# Patient Record
Sex: Female | Born: 1940
Health system: Southern US, Community
[De-identification: ages and names within clinical notes are randomized; demographics above are authoritative.]

## PROBLEM LIST (undated history)

## (undated) DIAGNOSIS — I10 Essential (primary) hypertension: Secondary | ICD-10-CM

## (undated) DIAGNOSIS — Z9889 Other specified postprocedural states: Secondary | ICD-10-CM

## (undated) HISTORY — DX: Other specified postprocedural states: Z98.890

## (undated) HISTORY — DX: Essential (primary) hypertension: I10

## (undated) HISTORY — PX: TONSILLECTOMY: SUR1361

---

## 2014-05-21 DIAGNOSIS — E559 Vitamin D deficiency, unspecified: Secondary | ICD-10-CM | POA: Diagnosis not present

## 2014-05-21 DIAGNOSIS — I10 Essential (primary) hypertension: Secondary | ICD-10-CM | POA: Diagnosis not present

## 2014-05-21 DIAGNOSIS — M8580 Other specified disorders of bone density and structure, unspecified site: Secondary | ICD-10-CM | POA: Diagnosis not present

## 2014-05-21 DIAGNOSIS — E8881 Metabolic syndrome: Secondary | ICD-10-CM | POA: Diagnosis not present

## 2014-05-21 DIAGNOSIS — Z0000 Encounter for general adult medical examination without abnormal findings: Secondary | ICD-10-CM | POA: Diagnosis not present

## 2014-09-02 DIAGNOSIS — H169 Unspecified keratitis: Secondary | ICD-10-CM | POA: Diagnosis not present

## 2014-10-01 DIAGNOSIS — E8881 Metabolic syndrome: Secondary | ICD-10-CM | POA: Diagnosis not present

## 2014-10-01 DIAGNOSIS — M8580 Other specified disorders of bone density and structure, unspecified site: Secondary | ICD-10-CM | POA: Diagnosis not present

## 2014-10-01 DIAGNOSIS — I10 Essential (primary) hypertension: Secondary | ICD-10-CM | POA: Diagnosis not present

## 2014-10-01 DIAGNOSIS — J309 Allergic rhinitis, unspecified: Secondary | ICD-10-CM | POA: Diagnosis not present

## 2014-10-19 DIAGNOSIS — K5900 Constipation, unspecified: Secondary | ICD-10-CM | POA: Diagnosis not present

## 2014-10-19 DIAGNOSIS — Z1211 Encounter for screening for malignant neoplasm of colon: Secondary | ICD-10-CM | POA: Diagnosis not present

## 2014-11-13 DIAGNOSIS — Z1211 Encounter for screening for malignant neoplasm of colon: Secondary | ICD-10-CM | POA: Diagnosis not present

## 2014-11-13 DIAGNOSIS — I10 Essential (primary) hypertension: Secondary | ICD-10-CM | POA: Diagnosis not present

## 2014-12-21 DIAGNOSIS — Z1231 Encounter for screening mammogram for malignant neoplasm of breast: Secondary | ICD-10-CM | POA: Diagnosis not present

## 2015-01-05 DIAGNOSIS — N63 Unspecified lump in breast: Secondary | ICD-10-CM | POA: Diagnosis not present

## 2015-01-05 DIAGNOSIS — R928 Other abnormal and inconclusive findings on diagnostic imaging of breast: Secondary | ICD-10-CM | POA: Diagnosis not present

## 2015-01-06 DIAGNOSIS — J309 Allergic rhinitis, unspecified: Secondary | ICD-10-CM | POA: Diagnosis not present

## 2015-01-06 DIAGNOSIS — M8580 Other specified disorders of bone density and structure, unspecified site: Secondary | ICD-10-CM | POA: Diagnosis not present

## 2015-01-06 DIAGNOSIS — I10 Essential (primary) hypertension: Secondary | ICD-10-CM | POA: Diagnosis not present

## 2015-01-06 DIAGNOSIS — Z23 Encounter for immunization: Secondary | ICD-10-CM | POA: Diagnosis not present

## 2015-01-06 DIAGNOSIS — E8881 Metabolic syndrome: Secondary | ICD-10-CM | POA: Diagnosis not present

## 2015-04-13 DIAGNOSIS — E7800 Pure hypercholesterolemia, unspecified: Secondary | ICD-10-CM | POA: Diagnosis not present

## 2015-04-13 DIAGNOSIS — E8881 Metabolic syndrome: Secondary | ICD-10-CM | POA: Diagnosis not present

## 2015-04-21 DIAGNOSIS — M8580 Other specified disorders of bone density and structure, unspecified site: Secondary | ICD-10-CM | POA: Diagnosis not present

## 2015-04-21 DIAGNOSIS — E8881 Metabolic syndrome: Secondary | ICD-10-CM | POA: Diagnosis not present

## 2015-04-21 DIAGNOSIS — E559 Vitamin D deficiency, unspecified: Secondary | ICD-10-CM | POA: Diagnosis not present

## 2015-04-21 DIAGNOSIS — E7800 Pure hypercholesterolemia, unspecified: Secondary | ICD-10-CM | POA: Diagnosis not present

## 2015-06-17 DIAGNOSIS — H1033 Unspecified acute conjunctivitis, bilateral: Secondary | ICD-10-CM | POA: Diagnosis not present

## 2015-06-17 DIAGNOSIS — B9689 Other specified bacterial agents as the cause of diseases classified elsewhere: Secondary | ICD-10-CM | POA: Diagnosis not present

## 2015-06-17 DIAGNOSIS — J019 Acute sinusitis, unspecified: Secondary | ICD-10-CM | POA: Diagnosis not present

## 2015-06-17 DIAGNOSIS — Z0000 Encounter for general adult medical examination without abnormal findings: Secondary | ICD-10-CM | POA: Diagnosis not present

## 2015-09-02 DIAGNOSIS — H2513 Age-related nuclear cataract, bilateral: Secondary | ICD-10-CM | POA: Diagnosis not present

## 2015-10-20 DIAGNOSIS — Z79899 Other long term (current) drug therapy: Secondary | ICD-10-CM | POA: Diagnosis not present

## 2015-10-20 DIAGNOSIS — E8881 Metabolic syndrome: Secondary | ICD-10-CM | POA: Diagnosis not present

## 2015-10-20 DIAGNOSIS — E7800 Pure hypercholesterolemia, unspecified: Secondary | ICD-10-CM | POA: Diagnosis not present

## 2015-10-27 DIAGNOSIS — M898X9 Other specified disorders of bone, unspecified site: Secondary | ICD-10-CM | POA: Diagnosis not present

## 2015-10-27 DIAGNOSIS — Z9181 History of falling: Secondary | ICD-10-CM | POA: Diagnosis not present

## 2015-10-27 DIAGNOSIS — Z1389 Encounter for screening for other disorder: Secondary | ICD-10-CM | POA: Diagnosis not present

## 2015-10-27 DIAGNOSIS — Z0000 Encounter for general adult medical examination without abnormal findings: Secondary | ICD-10-CM | POA: Diagnosis not present

## 2015-12-27 DIAGNOSIS — Z1231 Encounter for screening mammogram for malignant neoplasm of breast: Secondary | ICD-10-CM | POA: Diagnosis not present

## 2016-04-17 DIAGNOSIS — M8589 Other specified disorders of bone density and structure, multiple sites: Secondary | ICD-10-CM | POA: Diagnosis not present

## 2016-04-17 DIAGNOSIS — Z79899 Other long term (current) drug therapy: Secondary | ICD-10-CM | POA: Diagnosis not present

## 2016-04-17 DIAGNOSIS — E785 Hyperlipidemia, unspecified: Secondary | ICD-10-CM | POA: Diagnosis not present

## 2016-04-17 DIAGNOSIS — J309 Allergic rhinitis, unspecified: Secondary | ICD-10-CM | POA: Diagnosis not present

## 2016-04-17 DIAGNOSIS — E8881 Metabolic syndrome: Secondary | ICD-10-CM | POA: Diagnosis not present

## 2016-04-17 DIAGNOSIS — E559 Vitamin D deficiency, unspecified: Secondary | ICD-10-CM | POA: Diagnosis not present

## 2016-04-17 DIAGNOSIS — I10 Essential (primary) hypertension: Secondary | ICD-10-CM | POA: Diagnosis not present

## 2016-08-30 DIAGNOSIS — H2513 Age-related nuclear cataract, bilateral: Secondary | ICD-10-CM | POA: Diagnosis not present

## 2016-08-30 DIAGNOSIS — H524 Presbyopia: Secondary | ICD-10-CM | POA: Diagnosis not present

## 2016-10-31 DIAGNOSIS — J309 Allergic rhinitis, unspecified: Secondary | ICD-10-CM | POA: Diagnosis not present

## 2016-10-31 DIAGNOSIS — Z0000 Encounter for general adult medical examination without abnormal findings: Secondary | ICD-10-CM | POA: Diagnosis not present

## 2016-10-31 DIAGNOSIS — M8589 Other specified disorders of bone density and structure, multiple sites: Secondary | ICD-10-CM | POA: Diagnosis not present

## 2016-10-31 DIAGNOSIS — E785 Hyperlipidemia, unspecified: Secondary | ICD-10-CM | POA: Diagnosis not present

## 2016-10-31 DIAGNOSIS — E559 Vitamin D deficiency, unspecified: Secondary | ICD-10-CM | POA: Diagnosis not present

## 2016-10-31 DIAGNOSIS — E8881 Metabolic syndrome: Secondary | ICD-10-CM | POA: Diagnosis not present

## 2016-10-31 DIAGNOSIS — Z79899 Other long term (current) drug therapy: Secondary | ICD-10-CM | POA: Diagnosis not present

## 2016-10-31 DIAGNOSIS — Z9181 History of falling: Secondary | ICD-10-CM | POA: Diagnosis not present

## 2016-10-31 DIAGNOSIS — M150 Primary generalized (osteo)arthritis: Secondary | ICD-10-CM | POA: Diagnosis not present

## 2016-12-13 DIAGNOSIS — Z23 Encounter for immunization: Secondary | ICD-10-CM | POA: Diagnosis not present

## 2016-12-28 DIAGNOSIS — Z1231 Encounter for screening mammogram for malignant neoplasm of breast: Secondary | ICD-10-CM | POA: Diagnosis not present

## 2017-03-07 DIAGNOSIS — H40003 Preglaucoma, unspecified, bilateral: Secondary | ICD-10-CM | POA: Diagnosis not present

## 2017-04-17 DIAGNOSIS — Z6822 Body mass index (BMI) 22.0-22.9, adult: Secondary | ICD-10-CM | POA: Diagnosis not present

## 2017-04-17 DIAGNOSIS — Z1331 Encounter for screening for depression: Secondary | ICD-10-CM | POA: Diagnosis not present

## 2017-04-17 DIAGNOSIS — R002 Palpitations: Secondary | ICD-10-CM | POA: Diagnosis not present

## 2017-04-17 DIAGNOSIS — E785 Hyperlipidemia, unspecified: Secondary | ICD-10-CM | POA: Diagnosis not present

## 2017-04-17 DIAGNOSIS — Z1339 Encounter for screening examination for other mental health and behavioral disorders: Secondary | ICD-10-CM | POA: Diagnosis not present

## 2017-04-17 DIAGNOSIS — Z79899 Other long term (current) drug therapy: Secondary | ICD-10-CM | POA: Diagnosis not present

## 2017-04-17 DIAGNOSIS — E559 Vitamin D deficiency, unspecified: Secondary | ICD-10-CM | POA: Diagnosis not present

## 2017-04-17 DIAGNOSIS — Z0000 Encounter for general adult medical examination without abnormal findings: Secondary | ICD-10-CM | POA: Diagnosis not present

## 2017-05-07 DIAGNOSIS — I10 Essential (primary) hypertension: Secondary | ICD-10-CM | POA: Diagnosis not present

## 2017-05-07 DIAGNOSIS — R002 Palpitations: Secondary | ICD-10-CM | POA: Diagnosis not present

## 2017-05-07 DIAGNOSIS — R0609 Other forms of dyspnea: Secondary | ICD-10-CM | POA: Diagnosis not present

## 2017-05-07 DIAGNOSIS — E785 Hyperlipidemia, unspecified: Secondary | ICD-10-CM | POA: Diagnosis not present

## 2017-05-07 DIAGNOSIS — R011 Cardiac murmur, unspecified: Secondary | ICD-10-CM | POA: Diagnosis not present

## 2017-05-17 DIAGNOSIS — R0602 Shortness of breath: Secondary | ICD-10-CM | POA: Diagnosis not present

## 2017-05-17 DIAGNOSIS — R011 Cardiac murmur, unspecified: Secondary | ICD-10-CM | POA: Diagnosis not present

## 2017-05-17 DIAGNOSIS — I2510 Atherosclerotic heart disease of native coronary artery without angina pectoris: Secondary | ICD-10-CM | POA: Diagnosis not present

## 2017-05-21 DIAGNOSIS — I340 Nonrheumatic mitral (valve) insufficiency: Secondary | ICD-10-CM | POA: Diagnosis not present

## 2017-05-21 DIAGNOSIS — R002 Palpitations: Secondary | ICD-10-CM | POA: Diagnosis not present

## 2017-05-21 DIAGNOSIS — I341 Nonrheumatic mitral (valve) prolapse: Secondary | ICD-10-CM | POA: Diagnosis not present

## 2017-05-21 DIAGNOSIS — R0609 Other forms of dyspnea: Secondary | ICD-10-CM | POA: Diagnosis not present

## 2017-06-12 DIAGNOSIS — I341 Nonrheumatic mitral (valve) prolapse: Secondary | ICD-10-CM | POA: Diagnosis not present

## 2017-06-20 DIAGNOSIS — I341 Nonrheumatic mitral (valve) prolapse: Secondary | ICD-10-CM | POA: Diagnosis not present

## 2017-06-20 DIAGNOSIS — I272 Pulmonary hypertension, unspecified: Secondary | ICD-10-CM | POA: Diagnosis not present

## 2017-06-20 DIAGNOSIS — R05 Cough: Secondary | ICD-10-CM | POA: Diagnosis not present

## 2017-06-20 DIAGNOSIS — I340 Nonrheumatic mitral (valve) insufficiency: Secondary | ICD-10-CM | POA: Diagnosis not present

## 2017-06-20 DIAGNOSIS — I517 Cardiomegaly: Secondary | ICD-10-CM | POA: Diagnosis not present

## 2017-06-20 DIAGNOSIS — I081 Rheumatic disorders of both mitral and tricuspid valves: Secondary | ICD-10-CM | POA: Diagnosis not present

## 2017-07-02 DIAGNOSIS — R0609 Other forms of dyspnea: Secondary | ICD-10-CM | POA: Diagnosis not present

## 2017-07-02 DIAGNOSIS — I340 Nonrheumatic mitral (valve) insufficiency: Secondary | ICD-10-CM | POA: Diagnosis not present

## 2017-07-02 DIAGNOSIS — E785 Hyperlipidemia, unspecified: Secondary | ICD-10-CM | POA: Diagnosis not present

## 2017-07-02 DIAGNOSIS — I341 Nonrheumatic mitral (valve) prolapse: Secondary | ICD-10-CM | POA: Diagnosis not present

## 2017-07-05 DIAGNOSIS — I509 Heart failure, unspecified: Secondary | ICD-10-CM | POA: Diagnosis not present

## 2017-07-05 DIAGNOSIS — I340 Nonrheumatic mitral (valve) insufficiency: Secondary | ICD-10-CM | POA: Diagnosis not present

## 2017-07-24 DIAGNOSIS — J90 Pleural effusion, not elsewhere classified: Secondary | ICD-10-CM | POA: Diagnosis not present

## 2017-07-24 DIAGNOSIS — R011 Cardiac murmur, unspecified: Secondary | ICD-10-CM | POA: Diagnosis not present

## 2017-07-24 DIAGNOSIS — J95811 Postprocedural pneumothorax: Secondary | ICD-10-CM | POA: Diagnosis not present

## 2017-07-24 DIAGNOSIS — I517 Cardiomegaly: Secondary | ICD-10-CM | POA: Diagnosis not present

## 2017-07-24 DIAGNOSIS — I340 Nonrheumatic mitral (valve) insufficiency: Secondary | ICD-10-CM | POA: Diagnosis not present

## 2017-07-24 DIAGNOSIS — I10 Essential (primary) hypertension: Secondary | ICD-10-CM | POA: Diagnosis not present

## 2017-07-24 DIAGNOSIS — E785 Hyperlipidemia, unspecified: Secondary | ICD-10-CM | POA: Diagnosis not present

## 2017-07-24 DIAGNOSIS — Z01810 Encounter for preprocedural cardiovascular examination: Secondary | ICD-10-CM | POA: Diagnosis not present

## 2017-07-24 DIAGNOSIS — R001 Bradycardia, unspecified: Secondary | ICD-10-CM | POA: Diagnosis not present

## 2017-07-24 DIAGNOSIS — I4891 Unspecified atrial fibrillation: Secondary | ICD-10-CM | POA: Diagnosis not present

## 2017-07-24 DIAGNOSIS — R918 Other nonspecific abnormal finding of lung field: Secondary | ICD-10-CM | POA: Diagnosis not present

## 2017-07-24 DIAGNOSIS — I959 Hypotension, unspecified: Secondary | ICD-10-CM | POA: Diagnosis not present

## 2017-07-31 DIAGNOSIS — Z950 Presence of cardiac pacemaker: Secondary | ICD-10-CM | POA: Diagnosis not present

## 2017-07-31 DIAGNOSIS — J95811 Postprocedural pneumothorax: Secondary | ICD-10-CM | POA: Diagnosis not present

## 2017-07-31 DIAGNOSIS — J982 Interstitial emphysema: Secondary | ICD-10-CM | POA: Diagnosis not present

## 2017-07-31 DIAGNOSIS — E785 Hyperlipidemia, unspecified: Secondary | ICD-10-CM | POA: Diagnosis not present

## 2017-07-31 DIAGNOSIS — Z9911 Dependence on respirator [ventilator] status: Secondary | ICD-10-CM | POA: Diagnosis not present

## 2017-07-31 DIAGNOSIS — I4891 Unspecified atrial fibrillation: Secondary | ICD-10-CM | POA: Diagnosis not present

## 2017-07-31 DIAGNOSIS — R918 Other nonspecific abnormal finding of lung field: Secondary | ICD-10-CM | POA: Diagnosis not present

## 2017-07-31 DIAGNOSIS — D62 Acute posthemorrhagic anemia: Secondary | ICD-10-CM | POA: Diagnosis not present

## 2017-07-31 DIAGNOSIS — J90 Pleural effusion, not elsewhere classified: Secondary | ICD-10-CM | POA: Diagnosis not present

## 2017-07-31 DIAGNOSIS — J95821 Acute postprocedural respiratory failure: Secondary | ICD-10-CM | POA: Diagnosis not present

## 2017-07-31 DIAGNOSIS — Z952 Presence of prosthetic heart valve: Secondary | ICD-10-CM | POA: Diagnosis not present

## 2017-07-31 DIAGNOSIS — Z4682 Encounter for fitting and adjustment of non-vascular catheter: Secondary | ICD-10-CM | POA: Diagnosis not present

## 2017-07-31 DIAGNOSIS — R935 Abnormal findings on diagnostic imaging of other abdominal regions, including retroperitoneum: Secondary | ICD-10-CM | POA: Diagnosis not present

## 2017-07-31 DIAGNOSIS — R001 Bradycardia, unspecified: Secondary | ICD-10-CM | POA: Diagnosis not present

## 2017-07-31 DIAGNOSIS — I517 Cardiomegaly: Secondary | ICD-10-CM | POA: Diagnosis not present

## 2017-07-31 DIAGNOSIS — I10 Essential (primary) hypertension: Secondary | ICD-10-CM | POA: Diagnosis not present

## 2017-07-31 DIAGNOSIS — Z9889 Other specified postprocedural states: Secondary | ICD-10-CM | POA: Diagnosis not present

## 2017-07-31 DIAGNOSIS — R011 Cardiac murmur, unspecified: Secondary | ICD-10-CM | POA: Diagnosis not present

## 2017-07-31 DIAGNOSIS — I959 Hypotension, unspecified: Secondary | ICD-10-CM | POA: Diagnosis not present

## 2017-07-31 DIAGNOSIS — J939 Pneumothorax, unspecified: Secondary | ICD-10-CM | POA: Diagnosis not present

## 2017-07-31 DIAGNOSIS — Z48812 Encounter for surgical aftercare following surgery on the circulatory system: Secondary | ICD-10-CM | POA: Diagnosis not present

## 2017-07-31 DIAGNOSIS — G8918 Other acute postprocedural pain: Secondary | ICD-10-CM | POA: Diagnosis not present

## 2017-07-31 DIAGNOSIS — I340 Nonrheumatic mitral (valve) insufficiency: Secondary | ICD-10-CM | POA: Diagnosis not present

## 2017-07-31 DIAGNOSIS — Z978 Presence of other specified devices: Secondary | ICD-10-CM | POA: Diagnosis not present

## 2017-08-09 DIAGNOSIS — Z09 Encounter for follow-up examination after completed treatment for conditions other than malignant neoplasm: Secondary | ICD-10-CM | POA: Diagnosis not present

## 2017-08-09 DIAGNOSIS — J9811 Atelectasis: Secondary | ICD-10-CM | POA: Diagnosis not present

## 2017-08-09 DIAGNOSIS — Z9889 Other specified postprocedural states: Secondary | ICD-10-CM | POA: Diagnosis not present

## 2017-08-09 DIAGNOSIS — I4891 Unspecified atrial fibrillation: Secondary | ICD-10-CM | POA: Diagnosis not present

## 2017-08-09 DIAGNOSIS — Z6822 Body mass index (BMI) 22.0-22.9, adult: Secondary | ICD-10-CM | POA: Diagnosis not present

## 2017-08-30 DIAGNOSIS — Z48812 Encounter for surgical aftercare following surgery on the circulatory system: Secondary | ICD-10-CM | POA: Diagnosis not present

## 2017-09-03 DIAGNOSIS — Z7901 Long term (current) use of anticoagulants: Secondary | ICD-10-CM | POA: Diagnosis not present

## 2017-09-03 DIAGNOSIS — I480 Paroxysmal atrial fibrillation: Secondary | ICD-10-CM | POA: Diagnosis not present

## 2017-09-03 DIAGNOSIS — Z9889 Other specified postprocedural states: Secondary | ICD-10-CM | POA: Diagnosis not present

## 2017-09-03 DIAGNOSIS — R002 Palpitations: Secondary | ICD-10-CM | POA: Diagnosis not present

## 2017-09-05 DIAGNOSIS — I080 Rheumatic disorders of both mitral and aortic valves: Secondary | ICD-10-CM | POA: Diagnosis not present

## 2017-09-05 DIAGNOSIS — Z952 Presence of prosthetic heart valve: Secondary | ICD-10-CM | POA: Diagnosis not present

## 2017-09-24 DIAGNOSIS — Z954 Presence of other heart-valve replacement: Secondary | ICD-10-CM | POA: Diagnosis not present

## 2017-09-24 DIAGNOSIS — Z9889 Other specified postprocedural states: Secondary | ICD-10-CM | POA: Diagnosis not present

## 2017-09-26 DIAGNOSIS — Z9889 Other specified postprocedural states: Secondary | ICD-10-CM | POA: Diagnosis not present

## 2017-09-26 DIAGNOSIS — Z954 Presence of other heart-valve replacement: Secondary | ICD-10-CM | POA: Diagnosis not present

## 2017-09-28 DIAGNOSIS — Z954 Presence of other heart-valve replacement: Secondary | ICD-10-CM | POA: Diagnosis not present

## 2017-09-28 DIAGNOSIS — Z9889 Other specified postprocedural states: Secondary | ICD-10-CM | POA: Diagnosis not present

## 2017-10-01 DIAGNOSIS — Z9889 Other specified postprocedural states: Secondary | ICD-10-CM | POA: Diagnosis not present

## 2017-10-01 DIAGNOSIS — Z954 Presence of other heart-valve replacement: Secondary | ICD-10-CM | POA: Diagnosis not present

## 2017-10-03 DIAGNOSIS — Z9889 Other specified postprocedural states: Secondary | ICD-10-CM | POA: Diagnosis not present

## 2017-10-03 DIAGNOSIS — Z954 Presence of other heart-valve replacement: Secondary | ICD-10-CM | POA: Diagnosis not present

## 2017-10-05 DIAGNOSIS — Z954 Presence of other heart-valve replacement: Secondary | ICD-10-CM | POA: Diagnosis not present

## 2017-10-08 DIAGNOSIS — Z954 Presence of other heart-valve replacement: Secondary | ICD-10-CM | POA: Diagnosis not present

## 2017-10-10 DIAGNOSIS — Z954 Presence of other heart-valve replacement: Secondary | ICD-10-CM | POA: Diagnosis not present

## 2017-10-12 DIAGNOSIS — Z954 Presence of other heart-valve replacement: Secondary | ICD-10-CM | POA: Diagnosis not present

## 2017-10-31 DIAGNOSIS — Z9889 Other specified postprocedural states: Secondary | ICD-10-CM | POA: Diagnosis not present

## 2017-10-31 DIAGNOSIS — Z0389 Encounter for observation for other suspected diseases and conditions ruled out: Secondary | ICD-10-CM | POA: Diagnosis not present

## 2017-10-31 DIAGNOSIS — Z7901 Long term (current) use of anticoagulants: Secondary | ICD-10-CM | POA: Diagnosis not present

## 2017-10-31 DIAGNOSIS — I480 Paroxysmal atrial fibrillation: Secondary | ICD-10-CM | POA: Diagnosis not present

## 2017-11-01 DIAGNOSIS — Z6823 Body mass index (BMI) 23.0-23.9, adult: Secondary | ICD-10-CM | POA: Diagnosis not present

## 2017-11-01 DIAGNOSIS — J309 Allergic rhinitis, unspecified: Secondary | ICD-10-CM | POA: Diagnosis not present

## 2017-11-01 DIAGNOSIS — E8881 Metabolic syndrome: Secondary | ICD-10-CM | POA: Diagnosis not present

## 2017-11-01 DIAGNOSIS — E785 Hyperlipidemia, unspecified: Secondary | ICD-10-CM | POA: Diagnosis not present

## 2017-11-01 DIAGNOSIS — I10 Essential (primary) hypertension: Secondary | ICD-10-CM | POA: Diagnosis not present

## 2017-11-01 DIAGNOSIS — E559 Vitamin D deficiency, unspecified: Secondary | ICD-10-CM | POA: Diagnosis not present

## 2017-11-13 DIAGNOSIS — I10 Essential (primary) hypertension: Secondary | ICD-10-CM | POA: Diagnosis not present

## 2017-11-13 DIAGNOSIS — M8589 Other specified disorders of bone density and structure, multiple sites: Secondary | ICD-10-CM | POA: Diagnosis not present

## 2017-11-13 DIAGNOSIS — E559 Vitamin D deficiency, unspecified: Secondary | ICD-10-CM | POA: Diagnosis not present

## 2017-11-13 DIAGNOSIS — Z0000 Encounter for general adult medical examination without abnormal findings: Secondary | ICD-10-CM | POA: Diagnosis not present

## 2017-11-13 DIAGNOSIS — Z23 Encounter for immunization: Secondary | ICD-10-CM | POA: Diagnosis not present

## 2017-11-13 DIAGNOSIS — M150 Primary generalized (osteo)arthritis: Secondary | ICD-10-CM | POA: Diagnosis not present

## 2017-11-13 DIAGNOSIS — E8881 Metabolic syndrome: Secondary | ICD-10-CM | POA: Diagnosis not present

## 2017-11-13 DIAGNOSIS — E785 Hyperlipidemia, unspecified: Secondary | ICD-10-CM | POA: Diagnosis not present

## 2018-01-03 DIAGNOSIS — Z1231 Encounter for screening mammogram for malignant neoplasm of breast: Secondary | ICD-10-CM | POA: Diagnosis not present

## 2018-01-28 DIAGNOSIS — Z0389 Encounter for observation for other suspected diseases and conditions ruled out: Secondary | ICD-10-CM | POA: Diagnosis not present

## 2018-01-28 DIAGNOSIS — Z9889 Other specified postprocedural states: Secondary | ICD-10-CM | POA: Diagnosis not present

## 2018-01-28 DIAGNOSIS — Z7901 Long term (current) use of anticoagulants: Secondary | ICD-10-CM | POA: Diagnosis not present

## 2018-01-28 DIAGNOSIS — I480 Paroxysmal atrial fibrillation: Secondary | ICD-10-CM | POA: Diagnosis not present

## 2018-01-28 DIAGNOSIS — R002 Palpitations: Secondary | ICD-10-CM | POA: Diagnosis not present

## 2018-01-28 DIAGNOSIS — I10 Essential (primary) hypertension: Secondary | ICD-10-CM | POA: Diagnosis not present

## 2018-01-29 DIAGNOSIS — N6489 Other specified disorders of breast: Secondary | ICD-10-CM | POA: Diagnosis not present

## 2018-01-29 DIAGNOSIS — N6321 Unspecified lump in the left breast, upper outer quadrant: Secondary | ICD-10-CM | POA: Diagnosis not present

## 2018-01-29 DIAGNOSIS — R928 Other abnormal and inconclusive findings on diagnostic imaging of breast: Secondary | ICD-10-CM | POA: Diagnosis not present

## 2018-01-29 DIAGNOSIS — R922 Inconclusive mammogram: Secondary | ICD-10-CM | POA: Diagnosis not present

## 2018-02-21 DIAGNOSIS — I480 Paroxysmal atrial fibrillation: Secondary | ICD-10-CM | POA: Diagnosis not present

## 2018-02-21 DIAGNOSIS — R002 Palpitations: Secondary | ICD-10-CM | POA: Diagnosis not present

## 2018-03-13 DIAGNOSIS — H40003 Preglaucoma, unspecified, bilateral: Secondary | ICD-10-CM | POA: Diagnosis not present

## 2018-03-13 DIAGNOSIS — H25813 Combined forms of age-related cataract, bilateral: Secondary | ICD-10-CM | POA: Diagnosis not present

## 2018-03-18 DIAGNOSIS — Z0389 Encounter for observation for other suspected diseases and conditions ruled out: Secondary | ICD-10-CM | POA: Diagnosis not present

## 2018-03-18 DIAGNOSIS — I480 Paroxysmal atrial fibrillation: Secondary | ICD-10-CM | POA: Diagnosis not present

## 2018-03-18 DIAGNOSIS — Z9889 Other specified postprocedural states: Secondary | ICD-10-CM | POA: Diagnosis not present

## 2018-03-18 DIAGNOSIS — I10 Essential (primary) hypertension: Secondary | ICD-10-CM | POA: Diagnosis not present

## 2018-05-23 DIAGNOSIS — I10 Essential (primary) hypertension: Secondary | ICD-10-CM | POA: Diagnosis not present

## 2018-05-23 DIAGNOSIS — Z6822 Body mass index (BMI) 22.0-22.9, adult: Secondary | ICD-10-CM | POA: Diagnosis not present

## 2018-05-23 DIAGNOSIS — J309 Allergic rhinitis, unspecified: Secondary | ICD-10-CM | POA: Diagnosis not present

## 2018-05-23 DIAGNOSIS — Z1331 Encounter for screening for depression: Secondary | ICD-10-CM | POA: Diagnosis not present

## 2018-07-10 DIAGNOSIS — E782 Mixed hyperlipidemia: Secondary | ICD-10-CM | POA: Diagnosis not present

## 2018-07-10 DIAGNOSIS — I10 Essential (primary) hypertension: Secondary | ICD-10-CM | POA: Diagnosis not present

## 2018-07-10 DIAGNOSIS — Z0389 Encounter for observation for other suspected diseases and conditions ruled out: Secondary | ICD-10-CM | POA: Diagnosis not present

## 2018-07-10 DIAGNOSIS — I480 Paroxysmal atrial fibrillation: Secondary | ICD-10-CM | POA: Diagnosis not present

## 2018-07-10 DIAGNOSIS — Z9889 Other specified postprocedural states: Secondary | ICD-10-CM | POA: Diagnosis not present

## 2018-09-10 DIAGNOSIS — I371 Nonrheumatic pulmonary valve insufficiency: Secondary | ICD-10-CM | POA: Diagnosis not present

## 2018-09-10 DIAGNOSIS — I080 Rheumatic disorders of both mitral and aortic valves: Secondary | ICD-10-CM | POA: Diagnosis not present

## 2018-11-18 DIAGNOSIS — I4891 Unspecified atrial fibrillation: Secondary | ICD-10-CM | POA: Diagnosis not present

## 2018-11-18 DIAGNOSIS — Z23 Encounter for immunization: Secondary | ICD-10-CM | POA: Diagnosis not present

## 2018-11-18 DIAGNOSIS — Z79899 Other long term (current) drug therapy: Secondary | ICD-10-CM | POA: Diagnosis not present

## 2018-11-18 DIAGNOSIS — E559 Vitamin D deficiency, unspecified: Secondary | ICD-10-CM | POA: Diagnosis not present

## 2018-11-18 DIAGNOSIS — J309 Allergic rhinitis, unspecified: Secondary | ICD-10-CM | POA: Diagnosis not present

## 2018-11-18 DIAGNOSIS — Z9889 Other specified postprocedural states: Secondary | ICD-10-CM | POA: Diagnosis not present

## 2018-11-18 DIAGNOSIS — E785 Hyperlipidemia, unspecified: Secondary | ICD-10-CM | POA: Diagnosis not present

## 2018-11-18 DIAGNOSIS — Z0000 Encounter for general adult medical examination without abnormal findings: Secondary | ICD-10-CM | POA: Diagnosis not present

## 2018-11-18 DIAGNOSIS — E8881 Metabolic syndrome: Secondary | ICD-10-CM | POA: Diagnosis not present

## 2018-12-02 DIAGNOSIS — M1712 Unilateral primary osteoarthritis, left knee: Secondary | ICD-10-CM | POA: Diagnosis not present

## 2018-12-02 DIAGNOSIS — M25562 Pain in left knee: Secondary | ICD-10-CM | POA: Diagnosis not present

## 2018-12-02 DIAGNOSIS — G8929 Other chronic pain: Secondary | ICD-10-CM | POA: Diagnosis not present

## 2018-12-06 DIAGNOSIS — Z6825 Body mass index (BMI) 25.0-25.9, adult: Secondary | ICD-10-CM | POA: Diagnosis not present

## 2018-12-06 DIAGNOSIS — I480 Paroxysmal atrial fibrillation: Secondary | ICD-10-CM | POA: Diagnosis not present

## 2018-12-06 DIAGNOSIS — I10 Essential (primary) hypertension: Secondary | ICD-10-CM | POA: Diagnosis not present

## 2018-12-06 DIAGNOSIS — M1990 Unspecified osteoarthritis, unspecified site: Secondary | ICD-10-CM | POA: Diagnosis not present

## 2019-01-13 DIAGNOSIS — R05 Cough: Secondary | ICD-10-CM | POA: Diagnosis not present

## 2019-01-13 DIAGNOSIS — J029 Acute pharyngitis, unspecified: Secondary | ICD-10-CM | POA: Diagnosis not present

## 2019-01-13 DIAGNOSIS — Z20828 Contact with and (suspected) exposure to other viral communicable diseases: Secondary | ICD-10-CM | POA: Diagnosis not present

## 2019-01-27 ENCOUNTER — Other Ambulatory Visit: Payer: Self-pay

## 2019-01-27 ENCOUNTER — Emergency Department (HOSPITAL_COMMUNITY)
Admission: EM | Admit: 2019-01-27 | Discharge: 2019-01-27 | Disposition: A | Discharge status: Home / Self Care | Payer: Medicare HMO | Attending: Emergency Medicine | Admitting: Emergency Medicine

## 2019-01-27 ENCOUNTER — Emergency Department (HOSPITAL_COMMUNITY): Payer: Medicare HMO

## 2019-01-27 DIAGNOSIS — R05 Cough: Secondary | ICD-10-CM | POA: Diagnosis not present

## 2019-01-27 DIAGNOSIS — U071 COVID-19: Secondary | ICD-10-CM | POA: Diagnosis not present

## 2019-01-27 DIAGNOSIS — J029 Acute pharyngitis, unspecified: Secondary | ICD-10-CM

## 2019-01-27 DIAGNOSIS — R070 Pain in throat: Secondary | ICD-10-CM | POA: Diagnosis not present

## 2019-01-27 DIAGNOSIS — R0989 Other specified symptoms and signs involving the circulatory and respiratory systems: Secondary | ICD-10-CM | POA: Diagnosis not present

## 2019-01-27 LAB — GROUP A STREP BY PCR: Group A Strep by PCR: NOT DETECTED

## 2019-01-27 LAB — BASIC METABOLIC PANEL
Anion gap: 7 (ref 5–15)
BUN: 12 mg/dL (ref 8–23)
CO2: 27 mmol/L (ref 22–32)
Calcium: 9.5 mg/dL (ref 8.9–10.3)
Chloride: 106 mmol/L (ref 98–111)
Creatinine, Ser: 0.79 mg/dL (ref 0.44–1.00)
GFR calc Af Amer: >60 mL/min (ref >60)
GFR calc non Af Amer: >60 mL/min (ref >60)
Glucose, Bld: 100 mg/dL — ABNORMAL HIGH (ref 70–99)
Potassium: 4.1 mmol/L (ref 3.5–5.1)
Sodium: 140 mmol/L (ref 135–145)

## 2019-01-27 LAB — CBC WITH DIFFERENTIAL/PLATELET
Abs Immature Granulocytes: 0.02 10*3/uL (ref 0.00–0.07)
Basophils Absolute: 0 10*3/uL (ref 0.0–0.1)
Basophils Relative: 0 %
Eosinophils Absolute: 0.1 10*3/uL (ref 0.0–0.5)
Eosinophils Relative: 1 %
HCT: 44.5 % (ref 36.0–46.0)
Hemoglobin: 14.7 g/dL (ref 12.0–15.0)
Immature Granulocytes: 0 %
Lymphocytes Relative: 32 %
Lymphs Abs: 1.8 10*3/uL (ref 0.7–4.0)
MCH: 30.6 pg (ref 26.0–34.0)
MCHC: 33 g/dL (ref 30.0–36.0)
MCV: 92.7 fL (ref 80.0–100.0)
Monocytes Absolute: 0.5 10*3/uL (ref 0.1–1.0)
Monocytes Relative: 8 %
Neutro Abs: 3.4 10*3/uL (ref 1.7–7.7)
Neutrophils Relative %: 59 %
Platelets: 156 10*3/uL (ref 150–400)
RBC: 4.8 MIL/uL (ref 3.87–5.11)
RDW: 12.7 % (ref 11.5–15.5)
WBC: 5.8 10*3/uL (ref 4.0–10.5)
nRBC: 0 % (ref 0.0–0.2)

## 2019-01-27 MED ORDER — DEXAMETHASONE 4 MG PO TABS
16.0000 mg | ORAL_TABLET | Freq: Once | ORAL | Status: AC
  Administered 2019-01-27: 16 mg via ORAL
  Filled 2019-01-27: qty 4

## 2019-01-27 MED ORDER — IBUPROFEN 100 MG/5ML PO SUSP
400.0000 mg | Freq: Once | ORAL | Status: DC
  Filled 2019-01-27: qty 20

## 2019-01-27 NOTE — ED Notes (Signed)
Patient Alert and oriented to baseline. Stable and ambulatory to baseline. Patient verbalized understanding of the discharge instructions.  Patient belongings were taken by the patient.   

## 2019-01-27 NOTE — ED Notes (Signed)
ptar called by Cregg Jutte pt Is next in line.

## 2019-01-27 NOTE — ED Triage Notes (Signed)
Pt BIB [facility] from [facility] in [place]. Per EMS were told she was tested today and was positive. Pt not complaining of any symptoms only sore throat x2 weeks. VSS. NAD.

## 2019-01-27 NOTE — ED Provider Notes (Signed)
Emergency Department Provider Note   I have reviewed the triage vital signs and the nursing notes.   HISTORY  Chief Complaint Sore Throat   HPI Ashley Villa is a 78 y.o. female who presents here at the behest of her doctor because she has Covid.  Daughter is worried that because of her Covid and have an artificial valve that she be evaluated emergency room.  The patient also states that her daughter that she had pneumonia based off of an x-ray.  Patient's had a cough has been nonproductive.  Has been afebrile.  Her husband also has coronavirus she was diagnosed a couple weeks ago.  Patient without any GI symptoms.  No urinary symptoms no other associated symptoms.   No other associated or modifying symptoms.    No past medical history on file.  There are no active problems to display for this patient.   Allergies Patient has no allergy information on record.  No family history on file.  Social History Social History   Tobacco Use  . Smoking status: Not on file  Substance Use Topics  . Alcohol use: Not on file  . Drug use: Not on file    Review of Systems  All other systems negative except as documented in the HPI. All pertinent positives and negatives as reviewed in the HPI. ____________________________________________   PHYSICAL EXAM:  VITAL SIGNS: ED Triage Vitals  Enc Vitals Group     BP 01/27/19 1753 (!) 155/59     Pulse Rate 01/27/19 1753 68     Resp 01/27/19 1753 20     Temp 01/27/19 1753 98.4 F (36.9 C)     Temp Source 01/27/19 1753 Oral     SpO2 01/27/19 1735 99 %    Constitutional: Alert and oriented. Well appearing and in no acute distress. Eyes: Conjunctivae are normal. PERRL. EOMI. Head: Atraumatic. Nose: No congestion/rhinnorhea. Mouth/Throat: Mucous membranes are moist.  Oropharynx non-erythematous. Neck: No stridor.  No meningeal signs.   Cardiovascular: Normal rate, regular rhythm. Good peripheral circulation. Grossly normal  heart sounds.   Respiratory: Normal respiratory effort.  No retractions. Lungs CTAB. Gastrointestinal: Soft and nontender. No distention.  Musculoskeletal: No lower extremity tenderness nor edema. No gross deformities of extremities. Neurologic:  Normal speech and language. No gross focal neurologic deficits are appreciated.  Skin:  Skin is warm, dry and intact. No rash noted.  ____________________________________________   LABS (all labs ordered are listed, but only abnormal results are displayed)  Labs Reviewed  BASIC METABOLIC PANEL - Abnormal; Notable for the following components:      Result Value   Glucose, Bld 100 (*)    All other components within normal limits  GROUP A STREP BY PCR  CBC WITH DIFFERENTIAL/PLATELET   ____________________________________________  EKG   EKG Interpretation  Date/Time:  Monday January 27 2019 17:25:21 EST Ventricular Rate:  74 PR Interval:    QRS Duration: 87 QT Interval:  392 QTC Calculation: 402 R Axis:   -28 Text Interpretation: Sinus rhythm Atrial premature complexes Borderline left axis deviation Anterior infarct, old No significant change since last tracing Confirmed by Merrily Pew [phone]) on 01/27/2019 6:33:46 PM       ____________________________________________  RADIOLOGY  Dg Chest Portable 1 View  Result Date: 01/27/2019 CLINICAL DATA:  Persistent cough EXAM: PORTABLE CHEST 1 VIEW COMPARISON:  06/20/2017 FINDINGS: Subsegmental atelectasis or scarring at the bases. No consolidation or effusion. Normal heart size. No pneumothorax IMPRESSION: No active disease. Electronically Signed  By: Donavan Foil M.D.   On: 01/27/2019 18:55    ____________________________________________   PROCEDURES  Procedure(s) performed:   Procedures   ____________________________________________   INITIAL IMPRESSION / ASSESSMENT AND PLAN / ED COURSE  I do not believe patient is at risk from having coronavirus as far as her valve  goes.  Her vital signs are within normal limits and patient overall appears well no indication for admission to hospital or start antibiotics at this time.  Her x-ray here looks fine.  Without a fever, white count or productive cough I did hesitate to treat her for pneumonia as well.  Can follow-up with her PCP.   Pertinent labs & imaging results that were available during my care of the patient were reviewed by me and considered in my medical decision making (see chart for details).  A medical screening exam was performed and I feel the patient has had an appropriate workup for their chief complaint at this time and likelihood of emergent condition existing is low. They have been counseled on decision, discharge, follow up and which symptoms necessitate immediate return to the emergency department. They or their family verbally stated understanding and agreement with plan and discharged in stable condition.   ____________________________________________  FINAL CLINICAL IMPRESSION(S) / ED DIAGNOSES  Final diagnoses:  Sore throat  COVID-19     MEDICATIONS GIVEN DURING THIS VISIT:  Medications  ibuprofen (ADVIL) 100 MG/5ML suspension 400 mg (400 mg Oral Not Given 01/27/19 2133)  dexamethasone (DECADRON) tablet 16 mg (16 mg Oral Given 01/27/19 2132)     NEW OUTPATIENT MEDICATIONS STARTED DURING THIS VISIT:  New Prescriptions   No medications on file    Note:  This note was prepared with assistance of Dragon voice recognition software. Occasional wrong-word or sound-a-like substitutions may have occurred due to the inherent limitations of voice recognition software.   Merrily Pew, MD 01/27/19 2238

## 2019-02-12 DIAGNOSIS — J309 Allergic rhinitis, unspecified: Secondary | ICD-10-CM | POA: Diagnosis not present

## 2019-02-12 DIAGNOSIS — J1289 Other viral pneumonia: Secondary | ICD-10-CM | POA: Diagnosis not present

## 2019-02-12 DIAGNOSIS — U071 COVID-19: Secondary | ICD-10-CM | POA: Diagnosis not present

## 2019-02-12 DIAGNOSIS — Z6824 Body mass index (BMI) 24.0-24.9, adult: Secondary | ICD-10-CM | POA: Diagnosis not present

## 2019-03-19 DIAGNOSIS — H02889 Meibomian gland dysfunction of unspecified eye, unspecified eyelid: Secondary | ICD-10-CM | POA: Diagnosis not present

## 2019-03-19 DIAGNOSIS — H01009 Unspecified blepharitis unspecified eye, unspecified eyelid: Secondary | ICD-10-CM | POA: Diagnosis not present

## 2019-03-19 DIAGNOSIS — H25813 Combined forms of age-related cataract, bilateral: Secondary | ICD-10-CM | POA: Diagnosis not present

## 2019-03-19 DIAGNOSIS — H40003 Preglaucoma, unspecified, bilateral: Secondary | ICD-10-CM | POA: Diagnosis not present

## 2019-03-31 DIAGNOSIS — Z1231 Encounter for screening mammogram for malignant neoplasm of breast: Secondary | ICD-10-CM | POA: Diagnosis not present

## 2019-04-11 DIAGNOSIS — E785 Hyperlipidemia, unspecified: Secondary | ICD-10-CM | POA: Diagnosis not present

## 2019-04-11 DIAGNOSIS — I10 Essential (primary) hypertension: Secondary | ICD-10-CM | POA: Diagnosis not present

## 2019-04-11 DIAGNOSIS — I480 Paroxysmal atrial fibrillation: Secondary | ICD-10-CM | POA: Diagnosis not present

## 2019-04-11 DIAGNOSIS — Z9889 Other specified postprocedural states: Secondary | ICD-10-CM | POA: Diagnosis not present

## 2019-10-15 DIAGNOSIS — I480 Paroxysmal atrial fibrillation: Secondary | ICD-10-CM | POA: Diagnosis not present

## 2019-10-15 DIAGNOSIS — I10 Essential (primary) hypertension: Secondary | ICD-10-CM | POA: Diagnosis not present

## 2019-10-15 DIAGNOSIS — Z9889 Other specified postprocedural states: Secondary | ICD-10-CM | POA: Diagnosis not present

## 2019-10-15 DIAGNOSIS — R42 Dizziness and giddiness: Secondary | ICD-10-CM | POA: Diagnosis not present

## 2019-11-17 DIAGNOSIS — I083 Combined rheumatic disorders of mitral, aortic and tricuspid valves: Secondary | ICD-10-CM | POA: Diagnosis not present

## 2019-11-17 DIAGNOSIS — I371 Nonrheumatic pulmonary valve insufficiency: Secondary | ICD-10-CM | POA: Diagnosis not present

## 2019-11-17 DIAGNOSIS — Z954 Presence of other heart-valve replacement: Secondary | ICD-10-CM | POA: Diagnosis not present

## 2019-11-21 DIAGNOSIS — I480 Paroxysmal atrial fibrillation: Secondary | ICD-10-CM | POA: Diagnosis not present

## 2019-11-21 DIAGNOSIS — Z0000 Encounter for general adult medical examination without abnormal findings: Secondary | ICD-10-CM | POA: Diagnosis not present

## 2019-11-21 DIAGNOSIS — J309 Allergic rhinitis, unspecified: Secondary | ICD-10-CM | POA: Diagnosis not present

## 2019-11-21 DIAGNOSIS — I10 Essential (primary) hypertension: Secondary | ICD-10-CM | POA: Diagnosis not present

## 2019-11-21 DIAGNOSIS — Z6825 Body mass index (BMI) 25.0-25.9, adult: Secondary | ICD-10-CM | POA: Diagnosis not present

## 2019-11-21 DIAGNOSIS — E782 Mixed hyperlipidemia: Secondary | ICD-10-CM | POA: Diagnosis not present

## 2019-11-21 DIAGNOSIS — E7800 Pure hypercholesterolemia, unspecified: Secondary | ICD-10-CM | POA: Diagnosis not present

## 2019-11-21 DIAGNOSIS — Z79899 Other long term (current) drug therapy: Secondary | ICD-10-CM | POA: Diagnosis not present

## 2019-11-21 DIAGNOSIS — Z131 Encounter for screening for diabetes mellitus: Secondary | ICD-10-CM | POA: Diagnosis not present

## 2019-11-26 DIAGNOSIS — I480 Paroxysmal atrial fibrillation: Secondary | ICD-10-CM | POA: Diagnosis not present

## 2019-11-26 DIAGNOSIS — I340 Nonrheumatic mitral (valve) insufficiency: Secondary | ICD-10-CM | POA: Diagnosis not present

## 2019-11-26 DIAGNOSIS — I10 Essential (primary) hypertension: Secondary | ICD-10-CM | POA: Diagnosis not present

## 2019-11-26 DIAGNOSIS — Z9889 Other specified postprocedural states: Secondary | ICD-10-CM | POA: Diagnosis not present

## 2019-11-26 DIAGNOSIS — E782 Mixed hyperlipidemia: Secondary | ICD-10-CM | POA: Diagnosis not present

## 2020-02-07 IMAGING — DX DG CHEST 1V PORT
1 series · 1 of 1 positions shown · non-contrast
Comparison: 06/20/2017

CLINICAL DATA: Persistent cough

EXAM:
PORTABLE CHEST 1 VIEW

[chest]
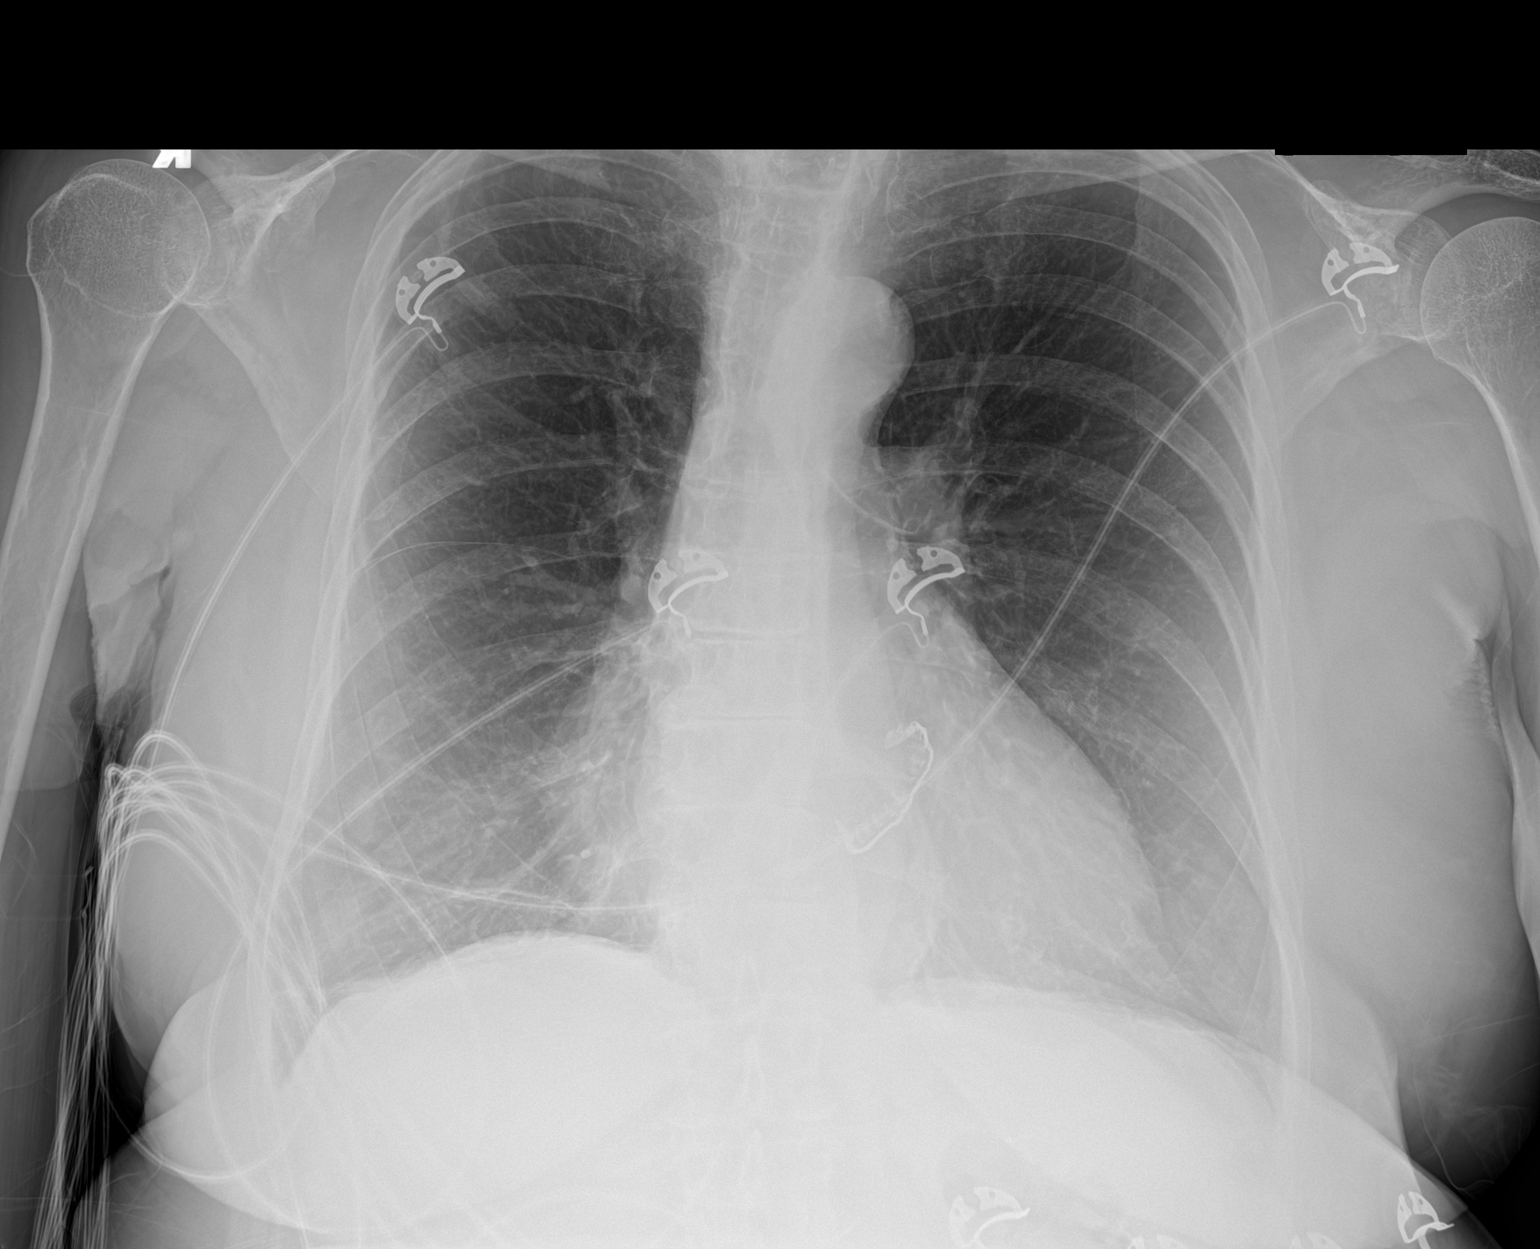

[1 of 1 positions shown; findings below may reference images not displayed]

FINDINGS: Subsegmental atelectasis or scarring at the bases. No consolidation
or effusion. Normal heart size. No pneumothorax
IMPRESSION: No active disease.

## 2020-02-19 DIAGNOSIS — R1310 Dysphagia, unspecified: Secondary | ICD-10-CM | POA: Diagnosis not present

## 2020-02-19 DIAGNOSIS — Z1331 Encounter for screening for depression: Secondary | ICD-10-CM | POA: Diagnosis not present

## 2020-02-19 DIAGNOSIS — J309 Allergic rhinitis, unspecified: Secondary | ICD-10-CM | POA: Diagnosis not present

## 2020-02-19 DIAGNOSIS — I10 Essential (primary) hypertension: Secondary | ICD-10-CM | POA: Diagnosis not present

## 2020-02-19 DIAGNOSIS — Z9889 Other specified postprocedural states: Secondary | ICD-10-CM | POA: Diagnosis not present

## 2020-02-19 DIAGNOSIS — R0982 Postnasal drip: Secondary | ICD-10-CM | POA: Diagnosis not present

## 2020-02-19 DIAGNOSIS — Z6825 Body mass index (BMI) 25.0-25.9, adult: Secondary | ICD-10-CM | POA: Diagnosis not present

## 2020-03-24 DIAGNOSIS — H01009 Unspecified blepharitis unspecified eye, unspecified eyelid: Secondary | ICD-10-CM | POA: Diagnosis not present

## 2020-03-24 DIAGNOSIS — H25813 Combined forms of age-related cataract, bilateral: Secondary | ICD-10-CM | POA: Diagnosis not present

## 2020-03-24 DIAGNOSIS — H02889 Meibomian gland dysfunction of unspecified eye, unspecified eyelid: Secondary | ICD-10-CM | POA: Diagnosis not present

## 2020-03-24 DIAGNOSIS — H40013 Open angle with borderline findings, low risk, bilateral: Secondary | ICD-10-CM | POA: Diagnosis not present

## 2020-04-05 DIAGNOSIS — Z9889 Other specified postprocedural states: Secondary | ICD-10-CM | POA: Diagnosis not present

## 2020-04-05 DIAGNOSIS — I480 Paroxysmal atrial fibrillation: Secondary | ICD-10-CM | POA: Diagnosis not present

## 2020-04-05 DIAGNOSIS — I340 Nonrheumatic mitral (valve) insufficiency: Secondary | ICD-10-CM | POA: Diagnosis not present

## 2020-04-05 DIAGNOSIS — I10 Essential (primary) hypertension: Secondary | ICD-10-CM | POA: Diagnosis not present

## 2020-04-19 DIAGNOSIS — I10 Essential (primary) hypertension: Secondary | ICD-10-CM | POA: Diagnosis not present

## 2020-04-19 DIAGNOSIS — J309 Allergic rhinitis, unspecified: Secondary | ICD-10-CM | POA: Diagnosis not present

## 2020-04-19 DIAGNOSIS — Z6824 Body mass index (BMI) 24.0-24.9, adult: Secondary | ICD-10-CM | POA: Diagnosis not present

## 2020-05-05 DIAGNOSIS — H40013 Open angle with borderline findings, low risk, bilateral: Secondary | ICD-10-CM | POA: Diagnosis not present

## 2020-05-06 DIAGNOSIS — Z1231 Encounter for screening mammogram for malignant neoplasm of breast: Secondary | ICD-10-CM | POA: Diagnosis not present

## 2020-05-17 DIAGNOSIS — I10 Essential (primary) hypertension: Secondary | ICD-10-CM | POA: Diagnosis not present

## 2020-05-17 DIAGNOSIS — N1831 Chronic kidney disease, stage 3a: Secondary | ICD-10-CM | POA: Diagnosis not present

## 2020-05-17 DIAGNOSIS — E7800 Pure hypercholesterolemia, unspecified: Secondary | ICD-10-CM | POA: Diagnosis not present

## 2020-05-17 DIAGNOSIS — I480 Paroxysmal atrial fibrillation: Secondary | ICD-10-CM | POA: Diagnosis not present

## 2020-05-31 DIAGNOSIS — Z9181 History of falling: Secondary | ICD-10-CM | POA: Diagnosis not present

## 2020-05-31 DIAGNOSIS — Z6825 Body mass index (BMI) 25.0-25.9, adult: Secondary | ICD-10-CM | POA: Diagnosis not present

## 2020-05-31 DIAGNOSIS — I10 Essential (primary) hypertension: Secondary | ICD-10-CM | POA: Diagnosis not present

## 2020-06-23 DIAGNOSIS — J309 Allergic rhinitis, unspecified: Secondary | ICD-10-CM | POA: Diagnosis not present

## 2020-06-23 DIAGNOSIS — I10 Essential (primary) hypertension: Secondary | ICD-10-CM | POA: Diagnosis not present

## 2020-06-23 DIAGNOSIS — R0982 Postnasal drip: Secondary | ICD-10-CM | POA: Diagnosis not present

## 2020-06-23 DIAGNOSIS — Z6825 Body mass index (BMI) 25.0-25.9, adult: Secondary | ICD-10-CM | POA: Diagnosis not present

## 2020-06-23 DIAGNOSIS — I4891 Unspecified atrial fibrillation: Secondary | ICD-10-CM | POA: Diagnosis not present

## 2020-07-14 DIAGNOSIS — I10 Essential (primary) hypertension: Secondary | ICD-10-CM | POA: Diagnosis not present

## 2020-07-14 DIAGNOSIS — I480 Paroxysmal atrial fibrillation: Secondary | ICD-10-CM | POA: Diagnosis not present

## 2020-07-14 DIAGNOSIS — I340 Nonrheumatic mitral (valve) insufficiency: Secondary | ICD-10-CM | POA: Diagnosis not present

## 2020-07-14 DIAGNOSIS — Z9889 Other specified postprocedural states: Secondary | ICD-10-CM | POA: Diagnosis not present

## 2020-08-23 DIAGNOSIS — M545 Low back pain, unspecified: Secondary | ICD-10-CM | POA: Diagnosis not present

## 2020-08-23 DIAGNOSIS — I480 Paroxysmal atrial fibrillation: Secondary | ICD-10-CM | POA: Diagnosis not present

## 2020-08-23 DIAGNOSIS — G8929 Other chronic pain: Secondary | ICD-10-CM | POA: Diagnosis not present

## 2020-08-23 DIAGNOSIS — I10 Essential (primary) hypertension: Secondary | ICD-10-CM | POA: Diagnosis not present

## 2020-08-23 DIAGNOSIS — Z6825 Body mass index (BMI) 25.0-25.9, adult: Secondary | ICD-10-CM | POA: Diagnosis not present

## 2020-08-23 DIAGNOSIS — N1831 Chronic kidney disease, stage 3a: Secondary | ICD-10-CM | POA: Diagnosis not present

## 2020-11-15 DIAGNOSIS — Z9889 Other specified postprocedural states: Secondary | ICD-10-CM | POA: Diagnosis not present

## 2020-11-15 DIAGNOSIS — I10 Essential (primary) hypertension: Secondary | ICD-10-CM | POA: Diagnosis not present

## 2020-11-26 DIAGNOSIS — Z780 Asymptomatic menopausal state: Secondary | ICD-10-CM | POA: Insufficient documentation

## 2020-11-26 DIAGNOSIS — M8580 Other specified disorders of bone density and structure, unspecified site: Secondary | ICD-10-CM | POA: Insufficient documentation

## 2020-11-30 DIAGNOSIS — I10 Essential (primary) hypertension: Secondary | ICD-10-CM | POA: Diagnosis not present

## 2020-11-30 DIAGNOSIS — E559 Vitamin D deficiency, unspecified: Secondary | ICD-10-CM | POA: Diagnosis not present

## 2020-11-30 DIAGNOSIS — Z13820 Encounter for screening for osteoporosis: Secondary | ICD-10-CM | POA: Diagnosis not present

## 2020-11-30 DIAGNOSIS — N1831 Chronic kidney disease, stage 3a: Secondary | ICD-10-CM | POA: Diagnosis not present

## 2020-11-30 DIAGNOSIS — Z0000 Encounter for general adult medical examination without abnormal findings: Secondary | ICD-10-CM | POA: Diagnosis not present

## 2020-11-30 DIAGNOSIS — Z23 Encounter for immunization: Secondary | ICD-10-CM | POA: Diagnosis not present

## 2020-11-30 DIAGNOSIS — E7800 Pure hypercholesterolemia, unspecified: Secondary | ICD-10-CM | POA: Diagnosis not present

## 2020-11-30 DIAGNOSIS — E8881 Metabolic syndrome: Secondary | ICD-10-CM | POA: Diagnosis not present

## 2020-11-30 DIAGNOSIS — I480 Paroxysmal atrial fibrillation: Secondary | ICD-10-CM | POA: Diagnosis not present

## 2021-01-17 DIAGNOSIS — I341 Nonrheumatic mitral (valve) prolapse: Secondary | ICD-10-CM | POA: Diagnosis not present

## 2021-01-17 DIAGNOSIS — Z9889 Other specified postprocedural states: Secondary | ICD-10-CM | POA: Diagnosis not present

## 2021-01-17 DIAGNOSIS — I480 Paroxysmal atrial fibrillation: Secondary | ICD-10-CM | POA: Diagnosis not present

## 2021-01-17 DIAGNOSIS — I340 Nonrheumatic mitral (valve) insufficiency: Secondary | ICD-10-CM | POA: Diagnosis not present

## 2021-01-17 DIAGNOSIS — I10 Essential (primary) hypertension: Secondary | ICD-10-CM | POA: Diagnosis not present

## 2021-01-18 DIAGNOSIS — Z23 Encounter for immunization: Secondary | ICD-10-CM | POA: Diagnosis not present

## 2021-05-09 DIAGNOSIS — Z1231 Encounter for screening mammogram for malignant neoplasm of breast: Secondary | ICD-10-CM | POA: Diagnosis not present

## 2021-05-11 DIAGNOSIS — H40013 Open angle with borderline findings, low risk, bilateral: Secondary | ICD-10-CM | POA: Diagnosis not present

## 2021-05-26 DIAGNOSIS — N6321 Unspecified lump in the left breast, upper outer quadrant: Secondary | ICD-10-CM | POA: Diagnosis not present

## 2021-05-26 DIAGNOSIS — R928 Other abnormal and inconclusive findings on diagnostic imaging of breast: Secondary | ICD-10-CM | POA: Diagnosis not present

## 2021-05-26 DIAGNOSIS — N6322 Unspecified lump in the left breast, upper inner quadrant: Secondary | ICD-10-CM | POA: Diagnosis not present

## 2021-05-31 DIAGNOSIS — I480 Paroxysmal atrial fibrillation: Secondary | ICD-10-CM | POA: Diagnosis not present

## 2021-05-31 DIAGNOSIS — N1831 Chronic kidney disease, stage 3a: Secondary | ICD-10-CM | POA: Diagnosis not present

## 2021-05-31 DIAGNOSIS — R928 Other abnormal and inconclusive findings on diagnostic imaging of breast: Secondary | ICD-10-CM | POA: Diagnosis not present

## 2021-05-31 DIAGNOSIS — Z1331 Encounter for screening for depression: Secondary | ICD-10-CM | POA: Diagnosis not present

## 2021-05-31 DIAGNOSIS — T148XXA Other injury of unspecified body region, initial encounter: Secondary | ICD-10-CM | POA: Diagnosis not present

## 2021-05-31 DIAGNOSIS — Z6824 Body mass index (BMI) 24.0-24.9, adult: Secondary | ICD-10-CM | POA: Diagnosis not present

## 2021-05-31 DIAGNOSIS — R0982 Postnasal drip: Secondary | ICD-10-CM | POA: Diagnosis not present

## 2021-05-31 DIAGNOSIS — J309 Allergic rhinitis, unspecified: Secondary | ICD-10-CM | POA: Diagnosis not present

## 2021-05-31 DIAGNOSIS — I10 Essential (primary) hypertension: Secondary | ICD-10-CM | POA: Diagnosis not present

## 2021-06-01 DIAGNOSIS — N6325 Unspecified lump in the left breast, overlapping quadrants: Secondary | ICD-10-CM | POA: Diagnosis not present

## 2021-06-01 DIAGNOSIS — C50812 Malignant neoplasm of overlapping sites of left female breast: Secondary | ICD-10-CM | POA: Diagnosis not present

## 2021-06-01 DIAGNOSIS — R928 Other abnormal and inconclusive findings on diagnostic imaging of breast: Secondary | ICD-10-CM | POA: Diagnosis not present

## 2021-06-01 DIAGNOSIS — C50412 Malignant neoplasm of upper-outer quadrant of left female breast: Secondary | ICD-10-CM | POA: Insufficient documentation

## 2021-06-03 NOTE — Progress Notes (Signed)
Initial phone contact with newly diagnosed breast cancer patient.Appointment scheduled with Dr. Jerel Shepherd for surgical consult on April 17,2023 per pt request. Ecouraged pt to stop by the [facility] and pick up a copy of "The Breast Cancer Treatment Handbook" . Faxed pt records to Dr. Venita Sheffield office. Pt is aware of appt time and location. Instructed pt to call navigator with questions and concerns at anytime. ?

## 2021-06-16 DIAGNOSIS — I4891 Unspecified atrial fibrillation: Secondary | ICD-10-CM | POA: Diagnosis not present

## 2021-06-16 DIAGNOSIS — T148XXA Other injury of unspecified body region, initial encounter: Secondary | ICD-10-CM | POA: Diagnosis not present

## 2021-06-16 DIAGNOSIS — Z6824 Body mass index (BMI) 24.0-24.9, adult: Secondary | ICD-10-CM | POA: Diagnosis not present

## 2021-06-16 DIAGNOSIS — Z1331 Encounter for screening for depression: Secondary | ICD-10-CM | POA: Diagnosis not present

## 2021-06-16 DIAGNOSIS — C50912 Malignant neoplasm of unspecified site of left female breast: Secondary | ICD-10-CM | POA: Diagnosis not present

## 2021-06-20 DIAGNOSIS — C50412 Malignant neoplasm of upper-outer quadrant of left female breast: Secondary | ICD-10-CM | POA: Diagnosis not present

## 2021-06-20 DIAGNOSIS — Z170 Estrogen receptor positive status [ER+]: Secondary | ICD-10-CM | POA: Diagnosis not present

## 2021-07-13 DIAGNOSIS — Z954 Presence of other heart-valve replacement: Secondary | ICD-10-CM | POA: Diagnosis not present

## 2021-07-13 DIAGNOSIS — C50412 Malignant neoplasm of upper-outer quadrant of left female breast: Secondary | ICD-10-CM | POA: Diagnosis not present

## 2021-07-13 DIAGNOSIS — Z170 Estrogen receptor positive status [ER+]: Secondary | ICD-10-CM | POA: Diagnosis not present

## 2021-07-13 DIAGNOSIS — C50212 Malignant neoplasm of upper-inner quadrant of left female breast: Secondary | ICD-10-CM | POA: Diagnosis not present

## 2021-07-13 DIAGNOSIS — C50912 Malignant neoplasm of unspecified site of left female breast: Secondary | ICD-10-CM | POA: Diagnosis not present

## 2021-07-27 DIAGNOSIS — I10 Essential (primary) hypertension: Secondary | ICD-10-CM | POA: Diagnosis not present

## 2021-07-27 DIAGNOSIS — I480 Paroxysmal atrial fibrillation: Secondary | ICD-10-CM | POA: Diagnosis not present

## 2021-07-27 DIAGNOSIS — Z9889 Other specified postprocedural states: Secondary | ICD-10-CM | POA: Diagnosis not present

## 2021-07-27 DIAGNOSIS — I340 Nonrheumatic mitral (valve) insufficiency: Secondary | ICD-10-CM | POA: Diagnosis not present

## 2021-08-10 ENCOUNTER — Other Ambulatory Visit: Payer: Self-pay | Admitting: Oncology

## 2021-08-10 DIAGNOSIS — Z170 Estrogen receptor positive status [ER+]: Secondary | ICD-10-CM

## 2021-08-11 ENCOUNTER — Other Ambulatory Visit: Payer: Self-pay

## 2021-08-11 ENCOUNTER — Encounter: Payer: Self-pay | Admitting: Oncology

## 2021-08-11 ENCOUNTER — Inpatient Hospital Stay: Payer: Medicare HMO | Attending: Oncology | Admitting: Oncology

## 2021-08-11 ENCOUNTER — Inpatient Hospital Stay: Payer: Medicare HMO

## 2021-08-11 VITALS — BP 172/88 | HR 60 | Temp 97.7°F | Resp 18 | Ht 64.0 in | Wt 139.9 lb

## 2021-08-11 DIAGNOSIS — C50412 Malignant neoplasm of upper-outer quadrant of left female breast: Secondary | ICD-10-CM

## 2021-08-11 DIAGNOSIS — N6489 Other specified disorders of breast: Secondary | ICD-10-CM | POA: Insufficient documentation

## 2021-08-11 DIAGNOSIS — Z800 Family history of malignant neoplasm of digestive organs: Secondary | ICD-10-CM | POA: Insufficient documentation

## 2021-08-11 DIAGNOSIS — Z170 Estrogen receptor positive status [ER+]: Secondary | ICD-10-CM | POA: Diagnosis not present

## 2021-08-11 DIAGNOSIS — I10 Essential (primary) hypertension: Secondary | ICD-10-CM | POA: Insufficient documentation

## 2021-08-11 DIAGNOSIS — Z79899 Other long term (current) drug therapy: Secondary | ICD-10-CM | POA: Insufficient documentation

## 2021-08-11 DIAGNOSIS — Z803 Family history of malignant neoplasm of breast: Secondary | ICD-10-CM | POA: Insufficient documentation

## 2021-08-11 DIAGNOSIS — M1990 Unspecified osteoarthritis, unspecified site: Secondary | ICD-10-CM | POA: Insufficient documentation

## 2021-08-11 DIAGNOSIS — Z952 Presence of prosthetic heart valve: Secondary | ICD-10-CM | POA: Insufficient documentation

## 2021-08-11 DIAGNOSIS — M85859 Other specified disorders of bone density and structure, unspecified thigh: Secondary | ICD-10-CM | POA: Insufficient documentation

## 2021-08-11 DIAGNOSIS — Z8052 Family history of malignant neoplasm of bladder: Secondary | ICD-10-CM | POA: Insufficient documentation

## 2021-08-11 LAB — BASIC METABOLIC PANEL
BUN: 21 (ref 4–21)
CO2: 32 — AB (ref 13–22)
Chloride: 103 (ref 99–108)
Creatinine: 0.8 (ref 0.5–1.1)
Glucose: 87
Potassium: 3.9 mEq/L (ref 3.5–5.1)
Sodium: 141 (ref 137–147)

## 2021-08-11 LAB — CBC AND DIFFERENTIAL
HCT: 42 (ref 36–46)
Hemoglobin: 13.3 (ref 12.0–16.0)
Neutrophils Absolute: 3.02
Platelets: 152 10*3/uL (ref 150–400)
WBC: 5.7

## 2021-08-11 LAB — COMPREHENSIVE METABOLIC PANEL
Albumin: 4.2 (ref 3.5–5.0)
Calcium: 9 (ref 8.7–10.7)

## 2021-08-11 LAB — CBC: RBC: 4.58 (ref 3.87–5.11)

## 2021-08-11 LAB — HEPATIC FUNCTION PANEL
ALT: 25 U/L (ref 7–35)
AST: 39 — AB (ref 13–35)
Alkaline Phosphatase: 89 (ref 25–125)
Bilirubin, Total: 1

## 2021-08-11 NOTE — Progress Notes (Signed)
Face to face visit with pt in [place]. Pt is here for her Oncology consult. Pt has been doing well since surgery. Encouraged pt to call with questions or concerns.

## 2021-08-12 ENCOUNTER — Telehealth: Payer: Self-pay | Admitting: Oncology

## 2021-08-12 NOTE — Telephone Encounter (Signed)
08/12/21 left msg-next appt scheduled on 08/25/21 at 430pm

## 2021-08-26 ENCOUNTER — Ambulatory Visit: Payer: Medicare HMO | Admitting: Oncology

## 2021-08-29 DIAGNOSIS — C50912 Malignant neoplasm of unspecified site of left female breast: Secondary | ICD-10-CM | POA: Diagnosis not present

## 2021-08-29 DIAGNOSIS — D0512 Intraductal carcinoma in situ of left breast: Secondary | ICD-10-CM | POA: Diagnosis not present

## 2021-08-29 DIAGNOSIS — S2002XD Contusion of left breast, subsequent encounter: Secondary | ICD-10-CM | POA: Diagnosis not present

## 2021-08-29 DIAGNOSIS — I10 Essential (primary) hypertension: Secondary | ICD-10-CM | POA: Diagnosis not present

## 2021-08-29 DIAGNOSIS — Z6824 Body mass index (BMI) 24.0-24.9, adult: Secondary | ICD-10-CM | POA: Diagnosis not present

## 2021-08-29 DIAGNOSIS — I480 Paroxysmal atrial fibrillation: Secondary | ICD-10-CM | POA: Diagnosis not present

## 2021-08-29 DIAGNOSIS — Z1331 Encounter for screening for depression: Secondary | ICD-10-CM | POA: Diagnosis not present

## 2021-08-29 DIAGNOSIS — C50412 Malignant neoplasm of upper-outer quadrant of left female breast: Secondary | ICD-10-CM | POA: Diagnosis not present

## 2021-09-02 ENCOUNTER — Ambulatory Visit: Payer: Medicare HMO | Admitting: Oncology

## 2021-09-02 NOTE — Progress Notes (Signed)
[name] CONSULT NOTE DATE: 08/11/21  Patient Care Team: [name], MD as PCP - General (Family Medicine) [name], RN as Registered Nurse  Assessment & Plan   Stage IIB left breast cancer This is a grade 3 infiltrating ductal carcinoma with a T2 N0 M0 measuring 28 mm with 2 negative nodes.  She also has high-grade ductal carcinoma in situ.  Margins are clear.  Estrogen receptors positive at 30% and progesterone receptors are negative with HER2 negative and a Ki-67 of 60%.  I feel she is at higher risk for recurrence and discussed chemotherapy such as TC for 4 cycles.  However we have to take into consideration her advanced age of 81 and whether she would tolerate treatment.  We have discussed the risks and benefits.  At this time we have decided to order Endopredict testing to aid in our decision.  Osteopenia Bone density scan was done in September 2022 and reveals osteopenia of the hip with a T score of -2.0.  The spine readings were normal.  She was offered Fosamax but declined.  Large seroma of the left breast We will plan to reexamine this when she returns.  She knows to contact Dr. Noberto Retort if it worsens or causes symptoms.  Positive family history for breast cancer She has 2 maternal aunts and a maternal cousin as well as a first paternal cousin all with breast cancer, primarily in their 74s and 43s.  I discussed the option of genetic testing but she seems not inclined to pursue this since she has no children of her own and is 81 years old, so it would not likely change her management.   I explained that I would normally recommend adjuvant chemotherapy for a stage IIb, especially with low estrogen receptors and grade 3 histology.  However she is 81 years old and is hesitant to consider this, understandably so.  We discussed the risks and benefits and she would like time to think this over.  I have suggested Endo predict testing to aid in our decision  regarding chemotherapy and she agrees.  I will send that off and see her back in 2 weeks to review those results.  At that time we will also reexamine her seroma and once again discuss the possibility of genetic testing.  She is not inclined to pursue genetic testing since she has no children and is 39 years old, so it is not likely to change her management.  I discussed the assessment and treatment plan with the patient.  The patient was provided an opportunity to ask questions and all were answered.  The patient agreed with the plan and demonstrated an understanding of the instructions.  The patient was advised to call back if the symptoms worsen or if the condition fails to improve as anticipated.  Thank you for the opportunity to participate in the care of your patients.   I provided 60 minutes of face-to-face time during this this encounter and > 50% was spent counseling as documented under my assessment and plan.    [name], MD [facility] AT [facility] [address] [place] [address] Dept: [phone] Dept Fax: [phone]   Orders Placed This Encounter  Procedures   CBC and differential    This external order was created through the Results Console.   CBC    This external order was created through the Results Console.   Basic metabolic panel  This external order was created through the Results Console.   Comprehensive metabolic panel    This external order was created through the Results Console.   Hepatic function panel    This external order was created through the Results Console.       CHIEF COMPLAINTS/PURPOSE OF CONSULTATION:  Left breast cancer  HISTORY OF PRESENTING ILLNESS:  Ashley Villa 81 y.o. female is here because of recent diagnosis of left breast carcinoma. The cancer was detected by screening mammogram on March 6 which showed a possible mass. The cancer was not palpable prior to  diagnosis.  She was sent for diagnostic mammogram on March 23 and this confirmed a persistent mass in the slightly upper left breast, middle depth.  An ultrasound confirmed a 1.6 cm irregular hypoechoic mass at 12:00, 2 cm from the nipple.  She then had a biopsy performed on March 29 which revealed invasive ductal carcinoma.  Estrogen receptors were positive at 30% with progesterone receptors negative and HER2 negative although it did show 1+ on immuno histochemistry.  Ki 67 with 60%.  She was referred to Dr. [name] who performed a lumpectomy on May 10.  The final pathology revealed a grade 3 invasive ductal carcinoma with high-grade ductal carcinoma in situ.  Margins were clear and 2 sentinel nodes were negative.  The final measurement was 28 mm for a T2 N0 M0.  She does have yearly mammograms.  I reviewed her records extensively and collaborated the history with the patient.  SUMMARY OF ONCOLOGIC HISTORY: Oncology History  Breast cancer of upper-outer quadrant of left female breast ([place])  06/01/2021 Initial Diagnosis   Breast cancer of upper-outer quadrant of left female breast ([place])   07/13/2021 Cancer Staging   Staging form: Breast, AJCC 8th Edition - Clinical stage from 07/13/2021: Stage IIB (cT2, cN0(sn), cM0, G3, ER+, PR-, HER2-) - Signed by [name], MD on 08/10/2021 Histopathologic type: Infiltrating duct carcinoma, NOS Stage prefix: Initial diagnosis Method of lymph node assessment: Sentinel lymph node biopsy Nuclear grade: G3 Histologic grading system: 3 grade system Laterality: Left Tumor size (mm): 28 Lymph-vascular invasion (LVI): LVI not present (absent)/not identified Diagnostic confirmation: Positive histology Specimen type: Excision Staged by: Managing physician Percentage of positive estrogen receptors (%): 30 Percentage of positive progesterone receptors (%): 0 HER2-IHC interpretation: Negative HER2-IHC value: Score 1+ Menopausal status:  Postmenopausal Ki-67 (%): 60 Stage used in treatment planning: Yes National guidelines used in treatment planning: Yes Type of national guideline used in treatment planning: NCCN     In terms of breast cancer risk profile:  She menarched at early age of 36 and went to menopause at age 23 She had no pregnancies, is nulliparous She was on hormone replacement therapy for approximately 1 month She has a positive family history of Breast cancer in 2 maternal aunts and a first paternal cousin  INTERVAL HISTORY: Currently, she is healing well after surgery apart from mild discomfort.  She complains that the area feels hard and there is fluid accumulation there which she feels is increasing.  She has no concern for local infection.  She has a prior lumpectomy of the right breast for benign disease.  She did have a bone density scan last year.  This was at [facility] family practice on November 30, 2020, and revealed osteopenia of the hip but normal spine.  She was offered Fosamax but declined.  Her CBC and CMP are all normal today other than a BUN of 21  and SGOT of 39.  She denies pain.  She did have colonoscopy 12 years ago.  Her appetite is good and she denies any significant changes in her weight.  She denies any gastrointestinal or cardiorespiratory symptoms.   MEDICAL HISTORY:  Past Medical History:  Diagnosis Date   H/O mitral valve repair    HTN (hypertension)    Status post left breast lumpectomy   Osteoarthritis  SURGICAL HISTORY: Past Surgical History:  Procedure Laterality Date   TONSILLECTOMY    History of mitral valve repair with right thoracotomy and 2019  SOCIAL HISTORY:  She is retired but worked in a bank. Social History   Socioeconomic History   Marital status: Married    Spouse name: Marcello Moores   Number of children: 0   Years of education: 12+1   Highest education level: High school graduate  Occupational History   Not on file  Tobacco Use   Smoking status: Never    Smokeless tobacco: Never  Vaping Use   Vaping Use: Never used  Substance and Sexual Activity   Alcohol use: Never   Drug use: Never   Sexual activity: Not Currently  Other Topics Concern   Not on file  Social History Narrative   Not on file   Social Determinants of Health   Financial Resource Strain: Not on file  Food Insecurity: Not on file  Transportation Needs: Not on file  Physical Activity: Not on file  Stress: Not on file  Social Connections: Not on file  Intimate Partner Violence: Not on file    FAMILY HISTORY: Family History  Problem Relation Age of Onset   Cancer Mother    Cancer Maternal Aunt   2 maternal aunts had breast cancer in their 33s and 65s 1 maternal cousin had breast cancer in her late 60s, daughter of the patient's brother A first paternal cousin had breast cancer at age 83 A maternal cousin had pancreatic cancer and bladder cancer   ALLERGIES:  has No Known Allergies.  MEDICATIONS:  Current Outpatient Medications  Medication Sig Dispense Refill   Calcium Citrate-Vitamin D 315-5 MG-MCG TABS Take 1 tablet by mouth daily.     Cholecalciferol 25 MCG (1000 UT) capsule Take by mouth.     Coenzyme Q10 50 MG CAPS Take by mouth.     glucosamine-chondroitin 500-400 MG tablet Take 1 tablet by mouth daily.     levocetirizine (XYZAL) 5 MG tablet Take 5 mg by mouth daily.     losartan (COZAAR) 100 MG tablet Take 100 mg by mouth daily.     metoprolol tartrate (LOPRESSOR) 25 MG tablet Take by mouth.     simvastatin (ZOCOR) 20 MG tablet Take 20 mg by mouth at bedtime.     No current facility-administered medications for this visit.    REVIEW OF SYSTEMS:   Constitutional: Denies fevers, chills or abnormal night sweats Eyes: Denies blurriness of vision, double vision or watery eyes Ears, nose, mouth, throat, and face: Denies mucositis or sore throat Respiratory: Denies cough, dyspnea or wheezes Cardiovascular: Denies palpitation, chest discomfort or  lower extremity swelling Gastrointestinal:  Denies nausea, heartburn or change in bowel habits Skin: Denies abnormal skin rashes Lymphatics: Denies new lymphadenopathy or easy bruising Neurological:Denies numbness, tingling or new weaknesses Behavioral/Psych: Mood is stable, no new changes  All other systems were reviewed with the patient and are negative.  PHYSICAL EXAMINATION: ECOG PERFORMANCE STATUS: 1 - Symptomatic but completely ambulatory  Vitals:   08/11/21 1520  BP: (!) 172/88  Pulse: 60  Resp: 18  Temp: 97.7 F (36.5 C)  SpO2: 96%   Filed Weights   08/11/21 1520  Weight: 139 lb 14.4 oz (63.5 kg)    GENERAL:alert, no distress and comfortable SKIN: skin color, texture, turgor are normal, no rashes or significant lesions EYES: normal, conjunctiva are pink and non-injected, sclera clear OROPHARYNX:no exudate, no erythema and lips, buccal mucosa, and tongue normal  NECK: supple, thyroid normal size, non-tender, without nodularity LYMPH:  no palpable lymphadenopathy in the cervical, axillary or inguinal LUNGS: clear to auscultation and percussion with normal breathing effort HEART: regular rate & rhythm and no murmurs and no lower extremity edema ABDOMEN:abdomen soft, non-tender and normal bowel sounds Musculoskeletal:no cyanosis of digits and no clubbing  PSYCH: alert & oriented x 3 with fluent speech NEURO: no focal motor/sensory deficits BREASTS: She has a faint lumpectomy scar along the upper areolar complex of the right breast The left breast has a scar of the upper inner quadrant and a large 8 cm seroma No masses in either breast  LABORATORY DATA:  I have reviewed the data as listed Lab Results  Component Value Date   WBC 5.7 08/11/2021   HGB 13.3 08/11/2021   HCT 42 08/11/2021   MCV 92.7 01/27/2019   PLT 152 08/11/2021   Lab Results  Component Value Date   NA 141 08/11/2021   K 3.9 08/11/2021   CL 103 08/11/2021   CO2 32 (A) 08/11/2021     RADIOGRAPHIC STUDIES: I have personally reviewed the radiological images as listed and agreed with the findings in the report.    EXAM: 05/26/21 DIGITAL DIAGNOSTIC UNILATERAL LEFT MAMMOGRAM WITH TOMOSYNTHESIS AND  CAD; ULTRASOUND LEFT BREAST  TECHNIQUE:  Left digital diagnostic mammography and breast tomosynthesis was  performed. The images were evaluated with computer-aided detection.;  Targeted ultrasound examination of the left breast was performed.  COMPARISON: Previous exam(s).  ACR Breast Density Category b: There are scattered areas of  fibroglandular density.  FINDINGS:  2D/3D spot compression views of the LEFT breast demonstrate a  persistent mass with distortion in the slightly UPPER LEFT breast,  middle depth.  Targeted ultrasound is performed, showing a 1 x 0.7 x 1.6 cm  irregular hypoechoic mass at the 12 o'clock position of the LEFT  breast 2 cm from the nipple.  No abnormal LEFT axillary lymph nodes are noted.  IMPRESSION:  1. Highly suspicious 1.6 cm UPPER LEFT breast mass. Tissue sampling  is recommended.  2. No abnormal appearing LEFT axillary lymph nodes.  RECOMMENDATION:  Ultrasound-guided LEFT breast biopsy, which will be scheduled.  I have discussed the findings and recommendations with the patient.  If applicable, a reminder letter will be sent to the patient  regarding the next appointment.  BI-RADS CATEGORY 5: Highly suggestive of malignancy.   Electronically Signed  By: Margarette Canada M.D.  On: 05/26/2021 15:43

## 2021-09-02 NOTE — Progress Notes (Deleted)
Ashley Villa CONSULT NOTE DATE: 08/11/21  Patient Care Team: [name], MD as PCP - General (Family Medicine) [name], RN as Registered Nurse  Assessment & Plan   Stage IIB left breast cancer This is a grade 3 infiltrating ductal carcinoma with a T2 N0 M0 measuring 28 mm with 2 negative nodes.  She also has high-grade ductal carcinoma in situ.  Margins are clear.  Estrogen receptors positive at 30% and progesterone receptors are negative with HER2 negative and a Ki-67 of 60%.  I feel she is at higher risk for recurrence and discussed chemotherapy such as TC for 4 cycles.  However we have to take into consideration her advanced age of 81 and whether she would tolerate treatment.  We have discussed the risks and benefits.  At this time we have decided to order Endopredict testing to aid in our decision.  Osteopenia Bone density scan was done in September 2022 and reveals osteopenia of the hip with a T score of -2.0.  The spine readings were normal.  She was offered Fosamax but declined.  Large seroma of the left breast We will plan to reexamine this when she returns.  She knows to contact Dr. Noberto Retort if it worsens or causes symptoms.  Positive family history for breast cancer She has 2 maternal aunts and a maternal cousin as well as a first paternal cousin all with breast cancer, primarily in their 67s and 71s.  I discussed the option of genetic testing but she seems not inclined to pursue this since she has no children of her own and is 6 years old, so it would not likely change her management.   I explained that I would normally recommend adjuvant chemotherapy for a stage IIb, especially with low estrogen receptors and grade 3 histology.  However she is 81 years old and is hesitant to consider this, understandably so.  We discussed the risks and benefits and she would like time to think this over.  I have suggested Endo predict testing to aid in our decision  regarding chemotherapy and she agrees.  I will send that off and see her back in 2 weeks to review those results.  At that time we will also reexamine her seroma and once again discuss the possibility of genetic testing.  She is not inclined to pursue genetic testing since she has no children and is 79 years old, so it is not likely to change her management.  I discussed the assessment and treatment plan with the patient.  The patient was provided an opportunity to ask questions and all were answered.  The patient agreed with the plan and demonstrated an understanding of the instructions.  The patient was advised to call back if the symptoms worsen or if the condition fails to improve as anticipated.   I provided  minutes of face-to-face time during this this encounter and > 50% was spent counseling as documented under my assessment and plan.    [name], MD [facility] [address] [place] [address] Dept: [phone] Dept Fax: [phone]   No orders of the defined types were placed in this encounter.      CHIEF COMPLAINTS/PURPOSE OF CONSULTATION:  Left breast cancer  HISTORY OF PRESENTING ILLNESS:  Ashley Villa 81 y.o. female is here because of recent diagnosis of left breast carcinoma. The cancer was detected by screening mammogram on March 6 which showed a  possible mass. The cancer was not palpable prior to diagnosis.  She was sent for diagnostic mammogram on March 23 and this confirmed a persistent mass in the slightly upper left breast, middle depth.  An ultrasound confirmed a 1.6 cm irregular hypoechoic mass at 12:00, 2 cm from the nipple.  She then had a biopsy performed on March 29 which revealed invasive ductal carcinoma.  Estrogen receptors were positive at 30% with progesterone receptors negative and HER2 negative although it did show 1+ on immuno histochemistry.  Ki 67 with 60%.  She was  referred to Dr. [name] who performed a lumpectomy on May 10.  The final pathology revealed a grade 3 invasive ductal carcinoma with high-grade ductal carcinoma in situ.  Margins were clear and 2 sentinel nodes were negative.  The final measurement was 28 mm for a T2 N0 M0.  She does have yearly mammograms.  I reviewed her records extensively and collaborated the history with the patient.  SUMMARY OF ONCOLOGIC HISTORY: Oncology History  Breast cancer of upper-outer quadrant of left female breast ([place])  06/01/2021 Initial Diagnosis   Breast cancer of upper-outer quadrant of left female breast ([place])   07/13/2021 Cancer Staging   Staging form: Breast, AJCC 8th Edition - Clinical stage from 07/13/2021: Stage IIB (cT2, cN0(sn), cM0, G3, ER+, PR-, HER2-) - Signed by [name], MD on 08/10/2021 Histopathologic type: Infiltrating duct carcinoma, NOS Stage prefix: Initial diagnosis Method of lymph node assessment: Sentinel lymph node biopsy Nuclear grade: G3 Histologic grading system: 3 grade system Laterality: Left Tumor size (mm): 28 Lymph-vascular invasion (LVI): LVI not present (absent)/not identified Diagnostic confirmation: Positive histology Specimen type: Excision Staged by: Managing physician Percentage of positive estrogen receptors (%): 30 Percentage of positive progesterone receptors (%): 0 HER2-IHC interpretation: Negative HER2-IHC value: Score 1+ Menopausal status: Postmenopausal Ki-67 (%): 60 Stage used in treatment planning: Yes National guidelines used in treatment planning: Yes Type of national guideline used in treatment planning: NCCN     In terms of breast cancer risk profile:  She menarched at early age of 23 and went to menopause at age 69 She had no pregnancies, is nulliparous She was on hormone replacement therapy for approximately 1 month She has a positive family history of Breast cancer in 2 maternal aunts and a first paternal  cousin  INTERVAL HISTORY: Currently, she is healing well after surgery apart from mild discomfort.  She complains that the area feels hard and there is fluid accumulation there which she feels is increasing.  She has no concern for local infection.  She has a prior lumpectomy of the right breast for benign disease.  She did have a bone density scan last year.  This was at [facility] family practice on November 30, 2020, and revealed osteopenia of the hip but normal spine.  She was offered Fosamax but declined.  Her CBC and CMP are all normal today other than a BUN of 21 and SGOT of 39.  She denies pain.  She did have colonoscopy 12 years ago.  Her appetite is good and she denies any significant changes in her weight.  She denies any gastrointestinal or cardiorespiratory symptoms.   MEDICAL HISTORY:  Past Medical History:  Diagnosis Date   H/O mitral valve repair    HTN (hypertension)    Status post left breast lumpectomy   Osteoarthritis  SURGICAL HISTORY: Past Surgical History:  Procedure Laterality Date   TONSILLECTOMY    History of mitral valve repair  with right thoracotomy and 2019  SOCIAL HISTORY:  She is retired but worked in a bank. Social History   Socioeconomic History   Marital status: Married    Spouse name: Marcello Moores   Number of children: 0   Years of education: 12+1   Highest education level: High school graduate  Occupational History   Not on file  Tobacco Use   Smoking status: Never   Smokeless tobacco: Never  Vaping Use   Vaping Use: Never used  Substance and Sexual Activity   Alcohol use: Never   Drug use: Never   Sexual activity: Not Currently  Other Topics Concern   Not on file  Social History Narrative   Not on file   Social Determinants of Health   Financial Resource Strain: Not on file  Food Insecurity: Not on file  Transportation Needs: Not on file  Physical Activity: Not on file  Stress: Not on file  Social Connections: Not on file  Intimate  Partner Violence: Not on file    FAMILY HISTORY: Family History  Problem Relation Age of Onset   Cancer Mother    Cancer Maternal Aunt   2 maternal aunts had breast cancer in their 26s and 23s 1 maternal cousin had breast cancer in her late 97s, daughter of the patient's brother A first paternal cousin had breast cancer at age 105 A maternal cousin had pancreatic cancer and bladder cancer   ALLERGIES:  has No Known Allergies.  MEDICATIONS:  Current Outpatient Medications  Medication Sig Dispense Refill   Calcium Citrate-Vitamin D 315-5 MG-MCG TABS Take 1 tablet by mouth daily.     Cholecalciferol 25 MCG (1000 UT) capsule Take by mouth.     Coenzyme Q10 50 MG CAPS Take by mouth.     glucosamine-chondroitin 500-400 MG tablet Take 1 tablet by mouth daily.     levocetirizine (XYZAL) 5 MG tablet Take 5 mg by mouth daily.     losartan (COZAAR) 100 MG tablet Take 100 mg by mouth daily.     metoprolol tartrate (LOPRESSOR) 25 MG tablet Take by mouth.     simvastatin (ZOCOR) 20 MG tablet Take 20 mg by mouth at bedtime.     No current facility-administered medications for this visit.    REVIEW OF SYSTEMS:   Constitutional: Denies fevers, chills or abnormal night sweats Eyes: Denies blurriness of vision, double vision or watery eyes Ears, nose, mouth, throat, and face: Denies mucositis or sore throat Respiratory: Denies cough, dyspnea or wheezes Cardiovascular: Denies palpitation, chest discomfort or lower extremity swelling Gastrointestinal:  Denies nausea, heartburn or change in bowel habits Skin: Denies abnormal skin rashes Lymphatics: Denies new lymphadenopathy or easy bruising Neurological:Denies numbness, tingling or new weaknesses Behavioral/Psych: Mood is stable, no new changes  All other systems were reviewed with the patient and are negative.  PHYSICAL EXAMINATION: ECOG PERFORMANCE STATUS: 1 - Symptomatic but completely ambulatory  There were no vitals filed for this  visit.  There were no vitals filed for this visit.   GENERAL:alert, no distress and comfortable SKIN: skin color, texture, turgor are normal, no rashes or significant lesions EYES: normal, conjunctiva are pink and non-injected, sclera clear OROPHARYNX:no exudate, no erythema and lips, buccal mucosa, and tongue normal  NECK: supple, thyroid normal size, non-tender, without nodularity LYMPH:  no palpable lymphadenopathy in the cervical, axillary or inguinal LUNGS: clear to auscultation and percussion with normal breathing effort HEART: regular rate & rhythm and no murmurs and no lower extremity edema ABDOMEN:abdomen soft,  non-tender and normal bowel sounds Musculoskeletal:no cyanosis of digits and no clubbing  PSYCH: alert & oriented x 3 with fluent speech NEURO: no focal motor/sensory deficits BREASTS: She has a faint lumpectomy scar along the upper areolar complex of the right breast The left breast has a scar of the upper inner quadrant and a large 8 cm seroma No masses in either breast  LABORATORY DATA:  I have reviewed the data as listed Lab Results  Component Value Date   WBC 5.7 08/11/2021   HGB 13.3 08/11/2021   HCT 42 08/11/2021   MCV 92.7 01/27/2019   PLT 152 08/11/2021   Lab Results  Component Value Date   NA 141 08/11/2021   K 3.9 08/11/2021   CL 103 08/11/2021   CO2 32 (A) 08/11/2021    RADIOGRAPHIC STUDIES: I have personally reviewed the radiological images as listed and agreed with the findings in the report.    EXAM: 05/26/21 DIGITAL DIAGNOSTIC UNILATERAL LEFT MAMMOGRAM WITH TOMOSYNTHESIS AND  CAD; ULTRASOUND LEFT BREAST  TECHNIQUE:  Left digital diagnostic mammography and breast tomosynthesis was  performed. The images were evaluated with computer-aided detection.;  Targeted ultrasound examination of the left breast was performed.  COMPARISON: Previous exam(s).  ACR Breast Density Category b: There are scattered areas of  fibroglandular density.   FINDINGS:  2D/3D spot compression views of the LEFT breast demonstrate a  persistent mass with distortion in the slightly UPPER LEFT breast,  middle depth.  Targeted ultrasound is performed, showing a 1 x 0.7 x 1.6 cm  irregular hypoechoic mass at the 12 o'clock position of the LEFT  breast 2 cm from the nipple.  No abnormal LEFT axillary lymph nodes are noted.  IMPRESSION:  1. Highly suspicious 1.6 cm UPPER LEFT breast mass. Tissue sampling  is recommended.  2. No abnormal appearing LEFT axillary lymph nodes.  RECOMMENDATION:  Ultrasound-guided LEFT breast biopsy, which will be scheduled.  I have discussed the findings and recommendations with the patient.  If applicable, a reminder letter will be sent to the patient  regarding the next appointment.  BI-RADS CATEGORY 5: Highly suggestive of malignancy.   Electronically Signed  By: Margarette Canada M.D.  On: 05/26/2021 15:43

## 2021-09-16 ENCOUNTER — Inpatient Hospital Stay: Payer: Medicare HMO | Attending: Oncology | Admitting: Oncology

## 2021-09-16 VITALS — BP 161/83 | HR 64 | Temp 97.7°F | Resp 18 | Ht 63.2 in | Wt 138.8 lb

## 2021-09-16 DIAGNOSIS — M8580 Other specified disorders of bone density and structure, unspecified site: Secondary | ICD-10-CM

## 2021-09-16 DIAGNOSIS — Z79899 Other long term (current) drug therapy: Secondary | ICD-10-CM | POA: Insufficient documentation

## 2021-09-16 DIAGNOSIS — I10 Essential (primary) hypertension: Secondary | ICD-10-CM | POA: Insufficient documentation

## 2021-09-16 DIAGNOSIS — Z780 Asymptomatic menopausal state: Secondary | ICD-10-CM

## 2021-09-16 DIAGNOSIS — Z5111 Encounter for antineoplastic chemotherapy: Secondary | ICD-10-CM | POA: Insufficient documentation

## 2021-09-16 DIAGNOSIS — Z170 Estrogen receptor positive status [ER+]: Secondary | ICD-10-CM | POA: Diagnosis not present

## 2021-09-16 DIAGNOSIS — C50412 Malignant neoplasm of upper-outer quadrant of left female breast: Secondary | ICD-10-CM | POA: Diagnosis not present

## 2021-09-16 NOTE — Progress Notes (Deleted)
Ashley Villa CONSULT NOTE DATE: 09/16/2021  Patient Care Team: [name], MD as PCP - General (Family Medicine) [name], RN as Registered Nurse  Assessment & Plan   Stage IIB left breast cancer This is a grade 3 infiltrating ductal carcinoma with a T2 N0 M0 measuring 28 mm with 2 negative nodes.  She also has high-grade ductal carcinoma in situ.  Margins are clear.  Estrogen receptors positive at 30% and progesterone receptors are negative with HER2 negative and a Ki-67 of 60%.  I feel she is at higher risk for recurrence and discussed chemotherapy such as TC for 4 cycles.  However we have to take into consideration her advanced age of 28 and whether she would tolerate treatment.  Her EndoPredict score came back at 5.2, correlating with a 48% risk of distant recurrence in the next 10 years. She would have 24% benefit with chemotherapy. We have discussed the risks and benefits. She will take time to think it over and reach out at the beginning of next week.  Osteopenia Bone density scan was done in September 2022 and reveals osteopenia of the hip with a T score of -2.0.  The spine readings were normal.  She was offered Fosamax but declined.  Large seroma/hematoma of the left breast This is stable  Positive family history for breast cancer She has 2 maternal aunts and a maternal cousin as well as a first paternal cousin all with breast cancer, primarily in their 61s and 23s.  I discussed the option of genetic testing but she seems not inclined to pursue this since she has no children of her own and is 52 years old, so it would not likely change her management.  Plan: I explained that I would normally recommend adjuvant chemotherapy for a stage IIB, especially with low estrogen receptors and grade 3 histology.  However she is 81 years old and is hesitant to consider this, understandably. We reviewed the EndoPredict results with her and her husband and the implications  for her risk of recurrence. We discussed the various options of chemotherapy such as TC or CMF and they will take time to think this over. She is already over 2 months post-op so would need to start within a month.  I discussed the assessment and treatment plan with the patient.  The patient was provided an opportunity to ask questions and all were answered.  The patient agreed with the plan and demonstrated an understanding of the instructions.  The patient was advised to call back if the symptoms worsen or if the condition fails to improve as anticipated.  Thank you for the opportunity to participate in the care of your patients.      [name], MD [place] [address] [place] [address] Dept: [phone] Dept Fax: [phone]   No orders of the defined types were placed in this encounter.      CHIEF COMPLAINTS/PURPOSE OF CONSULTATION:  Left breast cancer  HISTORY OF PRESENTING ILLNESS:  Ashley Villa 81 y.o. female is here because of recent diagnosis of left breast carcinoma. The cancer was detected by screening mammogram on March 6 which showed a possible mass. The cancer was not palpable prior to diagnosis.  She was sent for diagnostic mammogram on March 23 and this confirmed a persistent mass in the slightly upper left breast, middle depth.  An ultrasound confirmed a 1.6 cm irregular hypoechoic mass at  12:00, 2 cm from the nipple.  She then had a biopsy performed on March 29 which revealed invasive ductal carcinoma.  Estrogen receptors were positive at 30% with progesterone receptors negative and HER2 negative although it did show 1+ on immuno histochemistry.  Ki 67 with 60%.  She was referred to Dr. [name] who performed a lumpectomy on May 10.  The final pathology revealed a grade 3 invasive ductal carcinoma with high-grade ductal carcinoma in situ.  Margins were clear and 2 sentinel  nodes were negative.  The final measurement was 28 mm for a T2 N0 M0.  She does have yearly mammograms.  I reviewed her records extensively and collaborated the history with the patient.  SUMMARY OF ONCOLOGIC HISTORY: Oncology History  Breast cancer of upper-outer quadrant of left female breast ([facility])  06/01/2021 Initial Diagnosis   Breast cancer of upper-outer quadrant of left female breast ([facility])   07/13/2021 Cancer Staging   Staging form: Breast, AJCC 8th Edition - Clinical stage from 07/13/2021: Stage IIB (cT2, cN0(sn), cM0, G3, ER+, PR-, HER2-) - Signed by [name], MD on 08/10/2021 Histopathologic type: Infiltrating duct carcinoma, NOS Stage prefix: Initial diagnosis Method of lymph node assessment: Sentinel lymph node biopsy Nuclear grade: G3 Histologic grading system: 3 grade system Laterality: Left Tumor size (mm): 28 Lymph-vascular invasion (LVI): LVI not present (absent)/not identified Diagnostic confirmation: Positive histology Specimen type: Excision Staged by: Managing physician Percentage of positive estrogen receptors (%): 30 Percentage of positive progesterone receptors (%): 0 HER2-IHC interpretation: Negative HER2-IHC value: Score 1+ Menopausal status: Postmenopausal Ki-67 (%): 60 Stage used in treatment planning: Yes National guidelines used in treatment planning: Yes Type of national guideline used in treatment planning: NCCN     In terms of breast cancer risk profile:  She menarched at early age of 38 and went to menopause at age 71 She had no pregnancies, is nulliparous She was on hormone replacement therapy for approximately 1 month She has a positive family history of Breast cancer in 2 maternal aunts and a first paternal cousin  INTERVAL HISTORY: Ashley Villa is here today for a routine follow up. The has a prior lumpectomy of the right breast for benign disease. She reports the hematoma on her right breast  appeared stable today. Her appetite is  good and she has lost 1 pound since her last visit.  Wt Readings from Last 3 Encounters:  09/16/21 138 lb 12.8 oz (63 kg)  08/11/21 139 lb 14.4 oz (63.5 kg)   She is here to discuss her EndoPredict results. She has a Epclin score of 5.2. This correlates with a 48% risk of distant recurrence in the next 10 years. It also shows a chemotherapy benefit of 24%, so quite significant. We discussed that with chemotherapy it reduces her risk to 24% of cancer coming back, without chemo she is at a 48%. We discussed various treatment options and both were aware of the symptoms and toxicities of the treatment. I discussed the options of TC or CMF. It looks like she is leaning toward the more mild chemotherapy (CMF) treatment but has time to look over the various treatment options this weekend.  If we proceed with chemotherapy, I would plan to decrease her doses by 20% due to her age.  We would plan to start at the end of July.  I called Dr.Lininger, he could likely get her port in this week if she decides to proceed. She denies any gastrointestinal or cardiorespiratory symptoms.  She  denies fever, chills or other signs of infection.  She denies nausea, vomiting, bowel issues, or abdominal pain.  She denies sore throat, cough, dyspnea, or chest pain.   MEDICAL HISTORY:  Past Medical History:  Diagnosis Date   H/O mitral valve repair    HTN (hypertension)    Status post left breast lumpectomy   Osteoarthritis  SURGICAL HISTORY: Past Surgical History:  Procedure Laterality Date   TONSILLECTOMY    History of mitral valve repair with right thoracotomy and 2019  SOCIAL HISTORY:  She is retired but worked in a bank. Social History   Socioeconomic History   Marital status: Married    Spouse name: Marcello Moores   Number of children: 0   Years of education: 12+1   Highest education level: High school graduate  Occupational History   Not on file  Tobacco Use   Smoking status: Never   Smokeless tobacco: Never   Vaping Use   Vaping Use: Never used  Substance and Sexual Activity   Alcohol use: Never   Drug use: Never   Sexual activity: Not Currently  Other Topics Concern   Not on file  Social History Narrative   Not on file   Social Determinants of Health   Financial Resource Strain: Not on file  Food Insecurity: Not on file  Transportation Needs: Not on file  Physical Activity: Not on file  Stress: Not on file  Social Connections: Not on file  Intimate Partner Violence: Not on file    FAMILY HISTORY: Family History  Problem Relation Age of Onset   Cancer Mother    Cancer Maternal Aunt   2 maternal aunts had breast cancer in their 47s and 79s 1 maternal cousin had breast cancer in her late 65s, daughter of the patient's brother A first paternal cousin had breast cancer at age 44 A maternal cousin had pancreatic cancer and bladder cancer   ALLERGIES:  has No Known Allergies.  MEDICATIONS:  Current Outpatient Medications  Medication Sig Dispense Refill   Calcium Citrate-Vitamin D 315-5 MG-MCG TABS Take 1 tablet by mouth daily.     Cholecalciferol 25 MCG (1000 UT) capsule Take by mouth.     Coenzyme Q10 50 MG CAPS Take by mouth.     glucosamine-chondroitin 500-400 MG tablet Take 1 tablet by mouth daily.     levocetirizine (XYZAL) 5 MG tablet Take 5 mg by mouth daily.     losartan (COZAAR) 100 MG tablet Take 100 mg by mouth daily.     metoprolol tartrate (LOPRESSOR) 25 MG tablet Take by mouth.     simvastatin (ZOCOR) 20 MG tablet Take 20 mg by mouth at bedtime.     No current facility-administered medications for this visit.    REVIEW OF SYSTEMS:   Constitutional: Denies fevers, chills or abnormal night sweats Eyes: Denies blurriness of vision, double vision or watery eyes Ears, nose, mouth, throat, and face: Denies mucositis or sore throat Respiratory: Denies cough, dyspnea or wheezes Cardiovascular: Denies palpitation, chest discomfort or lower extremity  swelling Gastrointestinal:  Denies nausea, heartburn or change in bowel habits Skin: Denies abnormal skin rashes Lymphatics: Denies new lymphadenopathy or easy bruising Neurological:Denies numbness, tingling or new weaknesses Behavioral/Psych: Mood is stable, no new changes  All other systems were reviewed with the patient and are negative.  PHYSICAL EXAMINATION: ECOG PERFORMANCE STATUS: 1 - Symptomatic but completely ambulatory  There were no vitals filed for this visit.  There were no vitals filed for this visit.  GENERAL:alert, no distress and comfortable SKIN: skin color, texture, turgor are normal, no rashes or significant lesions EYES: normal, conjunctiva are pink and non-injected, sclera clear OROPHARYNX:no exudate, no erythema and lips, buccal mucosa, and tongue normal  NECK: supple, thyroid normal size, non-tender, without nodularity LYMPH:  no palpable lymphadenopathy in the cervical, axillary or inguinal LUNGS: clear to auscultation and percussion with normal breathing effort HEART: regular rate & rhythm and no murmurs and no lower extremity edema ABDOMEN:abdomen soft, non-tender and normal bowel sounds Musculoskeletal:no cyanosis of digits and no clubbing  PSYCH: alert & oriented x 3 with fluent speech NEURO: no focal motor/sensory deficits BREASTS: She has a larage hematoma center or the right breast measuring 8-9 cm across, very firm non tender, no redness, no heat The left breast has a scar of the upper inner quadrant and a large 8 cm seroma No masses in either breast  LABORATORY DATA:  I have reviewed the data as listed Lab Results  Component Value Date   WBC 5.7 08/11/2021   HGB 13.3 08/11/2021   HCT 42 08/11/2021   MCV 92.7 01/27/2019   PLT 152 08/11/2021   Lab Results  Component Value Date   NA 141 08/11/2021   K 3.9 08/11/2021   CL 103 08/11/2021   CO2 32 (A) 08/11/2021    RADIOGRAPHIC STUDIES: I have personally reviewed the radiological images  as listed and agreed with the findings in the report.    EXAM: 05/26/21 DIGITAL DIAGNOSTIC UNILATERAL LEFT MAMMOGRAM WITH TOMOSYNTHESIS AND  CAD; ULTRASOUND LEFT BREAST  TECHNIQUE:  Left digital diagnostic mammography and breast tomosynthesis was  performed. The images were evaluated with computer-aided detection.;  Targeted ultrasound examination of the left breast was performed.  COMPARISON: Previous exam(s).  ACR Breast Density Category b: There are scattered areas of  fibroglandular density.  FINDINGS:  2D/3D spot compression views of the LEFT breast demonstrate a  persistent mass with distortion in the slightly UPPER LEFT breast,  middle depth.  Targeted ultrasound is performed, showing a 1 x 0.7 x 1.6 cm  irregular hypoechoic mass at the 12 o'clock position of the LEFT  breast 2 cm from the nipple.  No abnormal LEFT axillary lymph nodes are noted.  IMPRESSION:  1. Highly suspicious 1.6 cm UPPER LEFT breast mass. Tissue sampling  is recommended.  2. No abnormal appearing LEFT axillary lymph nodes.  RECOMMENDATION:  Ultrasound-guided LEFT breast biopsy, which will be scheduled.  I have discussed the findings and recommendations with the patient.  If applicable, a reminder letter will be sent to the patient  regarding the next appointment.  BI-RADS CATEGORY 5: Highly suggestive of malignancy.   Electronically Signed  By: Margarette Canada M.D.  On: 05/26/2021 15:43     I,Gabriella Ballesteros,acting as a scribe for [name], MD.,have documented all relevant documentation on the behalf of [name], MD,as directed by  [name], MD while in the presence of [name], MD.

## 2021-09-19 ENCOUNTER — Telehealth: Payer: Self-pay

## 2021-09-19 ENCOUNTER — Other Ambulatory Visit: Payer: Self-pay | Admitting: Oncology

## 2021-09-19 NOTE — Telephone Encounter (Signed)
Pt to be seen in Dr Venita Sheffield office tomorrow  ===View-only below this line=== ----- Message ----- From: [name], MD Sent: 09/19/2021  10:43 AM EDT To: Juanetta Beets, RPH; Rommie Dunn Cherylann Banas, RN Subject: RE: New chemo                                  Thanks, I had discussed options of TC x 3 months vs CMF x 6 months.  So contact Dr. Venita Sheffield office to request port placement.  I spoke with him Friday pm but office was closed. Then once we know when it will be done, can sched chemo ed and treatments.    Pt has called to notify you she would like to proceed with the CMF regimen. She is calling Dr Noberto Retort this morning as well to go sign forms. Message sent to Dr Hinton Rao and [facility] @ [phone]

## 2021-09-19 NOTE — Progress Notes (Signed)
START OFF PATHWAY REGIMEN - Breast   [id number]:CMF (Cyclophosphamide IV + Methotrexate IV + Fluorouracil IV) q21 Days:   A cycle is every 21 days:     Cyclophosphamide      Fluorouracil      Methotrexate   **Always confirm dose/schedule in your pharmacy ordering system**  Patient Characteristics: Postoperative without Neoadjuvant Therapy (Pathologic Staging), Invasive Disease, Adjuvant Therapy, HER2 Negative/Unknown/Equivocal, ER Positive, Node Negative, pT1a, pN35m or pT1b-c, pN0/N167mor pT2 or Higher, pN0, EndoPredict(R), EPClin High Risk  (3.4  - 8.2) Therapeutic Status: Postoperative without Neoadjuvant Therapy (Pathologic Staging) AJCC Grade: G3 AJCC N Category: pN0 AJCC M Category: cM0 ER Status: Positive (+) AJCC 8 Stage Grouping: IIA HER2 Status: Negative (-) Oncotype Dx Recurrence Score: Ordered Other Genomic Test AJCC T Category: pT2 PR Status: Negative (-) Adjuvant Therapy Status: No Adjuvant Therapy Received Yet or Changing Initial Adjuvant Regimen due to Tolerance Has this patient completed genomic testing<= Yes - EndoPredict(R) EPClin Risk Score: EPClin High Risk (3.4 - 8.2) Intent of Therapy: Curative Intent, Discussed with Patient

## 2021-09-21 ENCOUNTER — Encounter: Payer: Self-pay | Admitting: Oncology

## 2021-09-21 ENCOUNTER — Telehealth: Payer: Self-pay | Admitting: Oncology

## 2021-09-21 NOTE — Telephone Encounter (Signed)
09/21/21 Spoke with patient and scheduled all appts

## 2021-09-22 ENCOUNTER — Encounter: Payer: Self-pay | Admitting: Oncology

## 2021-09-22 ENCOUNTER — Other Ambulatory Visit: Payer: Self-pay | Admitting: Pharmacist

## 2021-09-22 DIAGNOSIS — C50212 Malignant neoplasm of upper-inner quadrant of left female breast: Secondary | ICD-10-CM | POA: Diagnosis not present

## 2021-09-22 DIAGNOSIS — C50912 Malignant neoplasm of unspecified site of left female breast: Secondary | ICD-10-CM | POA: Diagnosis not present

## 2021-09-22 DIAGNOSIS — Z79899 Other long term (current) drug therapy: Secondary | ICD-10-CM | POA: Diagnosis not present

## 2021-09-22 DIAGNOSIS — Z452 Encounter for adjustment and management of vascular access device: Secondary | ICD-10-CM | POA: Diagnosis not present

## 2021-09-22 DIAGNOSIS — I517 Cardiomegaly: Secondary | ICD-10-CM | POA: Diagnosis not present

## 2021-09-26 ENCOUNTER — Other Ambulatory Visit: Payer: Self-pay

## 2021-09-27 ENCOUNTER — Inpatient Hospital Stay: Payer: Medicare HMO | Admitting: Hematology and Oncology

## 2021-09-27 ENCOUNTER — Inpatient Hospital Stay: Payer: Medicare HMO

## 2021-09-27 ENCOUNTER — Encounter: Payer: Self-pay | Admitting: Hematology and Oncology

## 2021-09-27 VITALS — BP 172/97 | HR 63 | Temp 97.6°F | Resp 18 | Ht 62.4 in | Wt 137.6 lb

## 2021-09-27 DIAGNOSIS — C50412 Malignant neoplasm of upper-outer quadrant of left female breast: Secondary | ICD-10-CM

## 2021-09-27 DIAGNOSIS — Z170 Estrogen receptor positive status [ER+]: Secondary | ICD-10-CM

## 2021-09-27 NOTE — Progress Notes (Signed)
[facility]  [phone] [phone]  Patient Care Team: [name], MD as PCP - General (Family Medicine) [name], RN as Registered Nurse [name], MD as Consulting Physician (Oncology)   Name of the patient: Ashley Villa  [id number]  1940-07-26   Date of visit: 09/27/21  Diagnosis- Breast Cancer    Heme/Onc history:  Oncology History  Breast cancer of upper-outer quadrant of left female breast ([place])  06/01/2021 Initial Diagnosis   Breast cancer of upper-outer quadrant of left female breast ([place])   07/13/2021 Cancer Staging   Staging form: Breast, AJCC 8th Edition - Clinical stage from 07/13/2021: Stage IIB (cT2, cN0(sn), cM0, G3, ER+, PR-, HER2-) - Signed by [name], MD on 08/10/2021 Histopathologic type: Infiltrating duct carcinoma, NOS Stage prefix: Initial diagnosis Method of lymph node assessment: Sentinel lymph node biopsy Nuclear grade: G3 Histologic grading system: 3 grade system Laterality: Left Tumor size (mm): 28 Lymph-vascular invasion (LVI): LVI not present (absent)/not identified Diagnostic confirmation: Positive histology Specimen type: Excision Staged by: Managing physician Percentage of positive estrogen receptors (%): 30 Percentage of positive progesterone receptors (%): 0 HER2-IHC interpretation: Negative HER2-IHC value: Score 1+ Menopausal status: Postmenopausal Ki-67 (%): 60 Stage used in treatment planning: Yes National guidelines used in treatment planning: Yes Type of national guideline used in treatment planning: NCCN   09/28/2021 -  Chemotherapy   Patient is on Treatment Plan : BREAST Adjuvant CMF IV q21d       Interval history-  Patient presents to chemo care clinic today for initial meeting in preparation for starting chemotherapy. I introduced the chemo care clinic and we discussed that the role of the clinic is to assist  those who are at an increased risk of emergency room visits and/or complications during the course of chemotherapy treatment. We discussed that the increased risk takes into account factors such as age, performance status, and co-morbidities. We also discussed that for some, this might include barriers to care such as not having a primary care provider, lack of insurance/transportation, or not being able to afford medications. We discussed that the goal of the program is to help prevent unplanned ER visits and help reduce complications during chemotherapy. We do this by discussing specific risk factors to each individual and identifying ways that we can help improve these risk factors and reduce barriers to care.   No Known Allergies  Past Medical History:  Diagnosis Date   H/O mitral valve repair    HTN (hypertension)    Status post left breast lumpectomy     Past Surgical History:  Procedure Laterality Date   TONSILLECTOMY      Social History   Socioeconomic History   Marital status: Married    Spouse name: Marcello Moores   Number of children: 0   Years of education: 12+1   Highest education level: High school graduate  Occupational History   Not on file  Tobacco Use   Smoking status: Never   Smokeless tobacco: Never  Vaping Use   Vaping Use: Never used  Substance and Sexual Activity   Alcohol use: Never   Drug use: Never   Sexual activity: Not Currently  Other Topics Concern   Not on file  Social History Narrative   Not on file   Social Determinants of Health   Financial Resource Strain: Not on file  Food Insecurity: Not on file  Transportation Needs: Not on file  Physical Activity: Not  on file  Stress: Not on file  Social Connections: Not on file  Intimate Partner Violence: Not on file    Family History  Problem Relation Age of Onset   Cancer Mother    Cancer Maternal Aunt      Current Outpatient Medications:    amoxicillin (AMOXIL) 500 MG capsule, Prn prior to  dentist visit with hx of mitral valve prolapse, Disp: , Rfl:    Calcium Citrate-Vitamin D 315-5 MG-MCG TABS, Take 1 tablet by mouth daily., Disp: , Rfl:    Cholecalciferol 25 MCG (1000 UT) capsule, Take by mouth., Disp: , Rfl:    Coenzyme Q10 50 MG CAPS, Take by mouth., Disp: , Rfl:    glucosamine-chondroitin 500-400 MG tablet, Take 1 tablet by mouth daily., Disp: , Rfl:    levocetirizine (XYZAL) 5 MG tablet, Take 5 mg by mouth daily., Disp: , Rfl:    losartan (COZAAR) 100 MG tablet, Take 100 mg by mouth daily., Disp: , Rfl:    metoprolol tartrate (LOPRESSOR) 25 MG tablet, Take by mouth., Disp: , Rfl:    simvastatin (ZOCOR) 20 MG tablet, Take 20 mg by mouth at bedtime., Disp: , Rfl:      Latest Ref Rng & Units 08/11/2021   12:00 AM  CMP  BUN 4 - 21 21      Creatinine 0.5 - 1.1 0.8      Sodium 137 - 147 141      Potassium 3.5 - 5.1 mEq/L 3.9      Chloride 99 - 108 103      CO2 13 - 22 32      Calcium 8.7 - 10.7 9.0      Alkaline Phos 25 - 125 89      AST 13 - 35 39      ALT 7 - 35 U/L 25         This result is from an external source.      Latest Ref Rng & Units 08/11/2021   12:00 AM  CBC  WBC  5.7      Hemoglobin 12.0 - 16.0 13.3      Hematocrit 36 - 46 42      Platelets 150 - 400 K/uL 152         This result is from an external source.    No images are attached to the encounter.  No results found.   Assessment and plan- Patient is a 81 y.o. female who presents to [facility] for initial meeting in preparation for starting chemotherapy for the treatment of breast cancer.   Chemo Care Clinic/High Risk for ER/Hospitalization during chemotherapy- We discussed the role of the chemo care clinic and identified patient specific risk factors. I discussed that patient was identified as high risk primarily based on:  Patient has past medical history positive for: Past Medical History:  Diagnosis Date   H/O mitral valve repair    HTN (hypertension)    Status post left  breast lumpectomy     Patient has past surgical history positive for: Past Surgical History:  Procedure Laterality Date   TONSILLECTOMY       Provided general information including the following: 1.  Date of education: 09/27/2021 2.  Physician name: Dr. Hinton Rao 3.  Diagnosis: Breast Cancer 4.  Stage: Stage IIB 5.  Control 6.  Chemotherapy plan including drugs and how often: Cyclophosphamide, Fluorouracil, Methotrexate 7.  Start date: Pending authorization 8.  Other referrals: None at this time  9.  The patient is to call our office with any questions or concerns.  Our office number [phone], if after hours or on the weekend, call the same number and wait for the answering service.  There is always an oncologist on call 10.  Medications prescribed: Ondansetron, Prochlorperazine 11.  The patient has verbalized understanding of the treatment plan and has no barriers to adherence or understanding.  Obtained signed consent from patient.  Discussed symptoms including 1.  Low blood counts including red blood cells, white blood cells and platelets. 2. Infection including to avoid large crowds, wash hands frequently, and stay away from people who were sick.  If fever develops of 100.4 or higher, call our office. 3.  Mucositis-given instructions on mouth rinse (baking soda and salt mixture).  Keep mouth clean.  Use soft bristle toothbrush.  If mouth sores develop, call our clinic. 4.  Nausea/vomiting-gave prescriptions for ondansetron 4 mg every 4 hours as needed for nausea, may take around the clock if persistent.  Compazine 10 mg every 6 hours, may take around the clock if persistent. 5.  Diarrhea-use over-the-counter Imodium.  Call clinic if not controlled. 6.  Constipation-use senna, 1 to 2 tablets twice a day.  If no BM in 2 to 3 days call the clinic. 7.  Loss of appetite-try to eat small meals every 2-3 hours.  Call clinic if not eating. 8.  Taste changes-zinc 500 mg daily.  If  becomes severe call clinic. 9.  Alcoholic beverages. 10.  Drink 2 to 3 quarts of water per day. 11.  Peripheral neuropathy-patient to call if numbness or tingling in hands or feet is persistent  Neulasta-will be given 24 to 48 hours after chemotherapy.  Gave information sheet on bone and joint pain.  Use Claritin or Pepcid.  May use ibuprofen or Aleve.  Call if symptoms persist or are unbearable.  Gave information on the supportive care team and how to contact them regarding services.  Discussed advanced directives.  The patient does not have their advanced directives but will look at the copy provided in their notebook and will call with any questions. Spiritual Nutrition Financial Social worker Advanced directives  Answered questions to patient satisfaction.  Patient is to call with any further questions or concerns.   The medication prescribed to the patient will be printed out from [email or web address] This will give the following information: Name of your medication Approved uses Dose and schedule Storage and handling Handling body fluids and waste Drug and food interactions Possible side effects and management Pregnancy, sexual activity, and contraception Obtaining medication  We discussed that social determinants of health may have significant impacts on health and outcomes for cancer patients.  Today we discussed specific social determinants of performance status, alcohol use, depression, financial needs, food insecurity, housing, interpersonal violence, social connections, stress, tobacco use, and transportation.    After lengthy discussion the following were identified as areas of need:   Outpatient services: We discussed options including home based and outpatient services, DME and care program. We discusssed that patients who participate in regular physical activity report fewer negative impacts of cancer and treatments and report less fatigue.   Financial Concerns: We  discussed that living with cancer can create tremendous financial burden.  We discussed options for assistance. I asked that if assistance is needed in affording medications or paying bills to please let us know so that we can provide assistance. We discussed options for food including social services and onsite food  pantry.  We will also notify Mort Sawyers to see if cancer center can provide additional support.  Referral to Social work: Introduced Education officer, museum Mort Sawyers and the services she can provide such as support with utility bill, cell phone and gas vouchers.   Support groups: We discussed options for support groups at the cancer center. If interested, please notify nurse navigator to enroll. We discussed options for managing stress including healthy eating, exercise as well as participating in no charge counseling services at the cancer center and support groups.  If these are of interest, patient can notify either myself or primary nursing team.We discussed options for management including medications and referral to quit Smart program  Transportation: We discussed options for transportation.  I have notified primary oncology team who will help assist with arranging Lucianne Lei transportation for appointments when/if needed. We also discussed options for transportation on short notice/acute visits.  Palliative care services: We have palliative care services available in the cancer center to discuss goals of care and advanced care planning.  Please let us know if you have any questions or would like to speak to our palliative nurse practitioner.  Symptom Management Clinic: We discussed our symptom management clinic which is available for acute concerns while receiving treatment such as nausea, vomiting or diarrhea.  We can be reached via telephone at [phone] or through my chart.  We are available for virtual or in person visits on the same day from 830 to 4 PM Monday through Friday. She denies  needing specific assistance at this time and She will be followed by Dr. Hinton Rao clinical team.  Plan: Discussed symptom management clinic. Discussed palliative care services. Discussed resources that are available here at the cancer center. Discussed medications and new prescriptions to begin treatment such as anti-nausea or steroids.   Disposition: RTC on   Visit Diagnosis No diagnosis found.  Patient expressed understanding and was in agreement with this plan. She also understands that She can call clinic at any time with any questions, concerns, or complaints.   I provided 30 minutes of  face to face  during this encounter, and > 50% was spent counseling as documented under my assessment & plan.   Dayton Scrape, FNP- [facility]

## 2021-09-28 ENCOUNTER — Other Ambulatory Visit: Payer: Self-pay | Admitting: Hematology and Oncology

## 2021-09-28 MED FILL — Methotrexate Sodium Inj PF 250 MG/10ML (25 MG/ML): INTRAMUSCULAR | Qty: 2 | Status: AC

## 2021-09-28 MED FILL — Cyclophosphamide For Inj 1 GM: INTRAMUSCULAR | Qty: 42 | Status: AC

## 2021-09-28 MED FILL — Fluorouracil IV Soln 2.5 GM/50ML (50 MG/ML): INTRAVENOUS | Qty: 17 | Status: AC

## 2021-09-28 MED FILL — Dexamethasone Sodium Phosphate Inj 100 MG/10ML: INTRAMUSCULAR | Qty: 1 | Status: AC

## 2021-09-29 ENCOUNTER — Other Ambulatory Visit: Payer: Medicare HMO

## 2021-09-29 ENCOUNTER — Other Ambulatory Visit: Payer: Self-pay | Admitting: Hematology and Oncology

## 2021-09-29 ENCOUNTER — Inpatient Hospital Stay: Payer: Medicare HMO

## 2021-09-29 VITALS — BP 138/83 | HR 65 | Temp 98.0°F | Resp 18 | Ht 63.0 in | Wt 137.1 lb

## 2021-09-29 DIAGNOSIS — C50412 Malignant neoplasm of upper-outer quadrant of left female breast: Secondary | ICD-10-CM | POA: Diagnosis not present

## 2021-09-29 DIAGNOSIS — Z170 Estrogen receptor positive status [ER+]: Secondary | ICD-10-CM | POA: Diagnosis not present

## 2021-09-29 DIAGNOSIS — I10 Essential (primary) hypertension: Secondary | ICD-10-CM | POA: Diagnosis not present

## 2021-09-29 DIAGNOSIS — Z5111 Encounter for antineoplastic chemotherapy: Secondary | ICD-10-CM | POA: Diagnosis not present

## 2021-09-29 DIAGNOSIS — Z79899 Other long term (current) drug therapy: Secondary | ICD-10-CM | POA: Diagnosis not present

## 2021-09-29 MED ORDER — FLUOROURACIL CHEMO INJECTION 2.5 GM/50ML
500.0000 mg/m2 | Freq: Once | INTRAVENOUS | Status: AC
  Administered 2021-09-29: 850 mg via INTRAVENOUS
  Filled 2021-09-29: qty 17

## 2021-09-29 MED ORDER — METHOTREXATE SODIUM (PF) CHEMO INJECTION 250 MG/10ML
29.7500 mg/m2 | Freq: Once | INTRAMUSCULAR | Status: AC
  Administered 2021-09-29: 50 mg via INTRAVENOUS
  Filled 2021-09-29: qty 2

## 2021-09-29 MED ORDER — SODIUM CHLORIDE 0.9 % IV SOLN: Freq: Once | INTRAVENOUS | Status: AC

## 2021-09-29 MED ORDER — PALONOSETRON HCL INJECTION 0.25 MG/5ML
0.2500 mg | Freq: Once | INTRAVENOUS | Status: AC
  Administered 2021-09-29: 0.25 mg via INTRAVENOUS
  Filled 2021-09-29: qty 5

## 2021-09-29 MED ORDER — ONDANSETRON HCL 8 MG PO TABS: 8.0000 mg | ORAL_TABLET | Freq: Two times a day (BID) | ORAL | 1 refills | Status: AC | PRN

## 2021-09-29 MED ORDER — HEPARIN SOD (PORK) LOCK FLUSH 100 UNIT/ML IV SOLN
500.0000 [IU] | Freq: Once | INTRAVENOUS | Status: AC | PRN
  Administered 2021-09-29: 500 [IU]

## 2021-09-29 MED ORDER — SODIUM CHLORIDE 0.9 % IV SOLN
10.0000 mg | Freq: Once | INTRAVENOUS | Status: AC
  Administered 2021-09-29: 10 mg via INTRAVENOUS
  Filled 2021-09-29: qty 10

## 2021-09-29 MED ORDER — PROCHLORPERAZINE MALEATE 10 MG PO TABS: 10.0000 mg | ORAL_TABLET | Freq: Four times a day (QID) | ORAL | 1 refills | Status: AC | PRN

## 2021-09-29 MED ORDER — ONDANSETRON HCL 8 MG PO TABS: 8.0000 mg | ORAL_TABLET | Freq: Two times a day (BID) | ORAL | 1 refills | Status: DC | PRN

## 2021-09-29 MED ORDER — SODIUM CHLORIDE 0.9% FLUSH
10.0000 mL | INTRAVENOUS | Status: DC | PRN
  Administered 2021-09-29: 10 mL

## 2021-09-29 MED ORDER — SODIUM CHLORIDE 0.9 % IV SOLN
500.0000 mg/m2 | Freq: Once | INTRAVENOUS | Status: AC
  Administered 2021-09-29: 840 mg via INTRAVENOUS
  Filled 2021-09-29: qty 42

## 2021-09-29 NOTE — Patient Instructions (Signed)
[facility]  Discharge Instructions: Thank you for choosing [facility] to provide your oncology and hematology care.  If you have a lab appointment with the [facility], please go directly to the [facility] and check in at the registration area.   Wear comfortable clothing and clothing appropriate for easy access to any Portacath or PICC line.   We strive to give you quality time with your provider. You may need to reschedule your appointment if you arrive late (15 or more minutes).  Arriving late affects you and other patients whose appointments are after yours.  Also, if you miss three or more appointments without notifying the office, you may be dismissed from the clinic at the provider's discretion.      For prescription refill requests, have your pharmacy contact our office and allow 72 hours for refills to be completed.    Today you received the following chemotherapy and/or immunotherapy agents Cytoxan, Methotrexate, FluorouracilFluorouracil, 5-FU injection What is this medication? FLUOROURACIL, 5-FU (flure oh YOOR a sil) is a chemotherapy drug. It slows the growth of cancer cells. This medicine is used to treat many types of cancer like breast cancer, colon or rectal cancer, pancreatic cancer, and stomach cancer. This medicine may be used for other purposes; ask your health care provider or pharmacist if you have questions. COMMON BRAND NAME(S): Adrucil What should I tell my care team before I take this medication? They need to know if you have any of these conditions: blood disorders dihydropyrimidine dehydrogenase (DPD) deficiency infection (especially a virus infection such as chickenpox, cold sores, or herpes) kidney disease liver disease malnourished, poor nutrition recent or ongoing radiation therapy an unusual or allergic reaction to fluorouracil, other chemotherapy, other medicines, foods, dyes, or preservatives pregnant or trying  to get pregnant breast-feeding How should I use this medication? This drug is given as an infusion or injection into a vein. It is administered in a hospital or clinic by a specially trained health care professional. Talk to your pediatrician regarding the use of this medicine in children. Special care may be needed. Overdosage: If you think you have taken too much of this medicine contact a poison control center or emergency room at once. NOTE: This medicine is only for you. Do not share this medicine with others. What if I miss a dose? It is important not to miss your dose. Call your doctor or health care professional if you are unable to keep an appointment. What may interact with this medication? Do not take this medicine with any of the following medications: live virus vaccines This medicine may also interact with the following medications: medicines that treat or prevent blood clots like warfarin, enoxaparin, and dalteparin This list may not describe all possible interactions. Give your health care provider a list of all the medicines, herbs, non-prescription drugs, or dietary supplements you use. Also tell them if you smoke, drink alcohol, or use illegal drugs. Some items may interact with your medicine. What should I watch for while using this medication? Visit your doctor for checks on your progress. This drug may make you feel generally unwell. This is not uncommon, as chemotherapy can affect healthy cells as well as cancer cells. Report any side effects. Continue your course of treatment even though you feel ill unless your doctor tells you to stop. In some cases, you may be given additional medicines to help with side effects. Follow all directions for their use. Call your doctor  or health care professional for advice if you get a fever, chills or sore throat, or other symptoms of a cold or flu. Do not treat yourself. This drug decreases your body's ability to fight infections. Try to  avoid being around people who are sick. This medicine may increase your risk to bruise or bleed. Call your doctor or health care professional if you notice any unusual bleeding. Be careful brushing and flossing your teeth or using a toothpick because you may get an infection or bleed more easily. If you have any dental work done, tell your dentist you are receiving this medicine. Avoid taking products that contain aspirin, acetaminophen, ibuprofen, naproxen, or ketoprofen unless instructed by your doctor. These medicines may hide a fever. Do not become pregnant while taking this medicine. Women should inform their doctor if they wish to become pregnant or think they might be pregnant. There is a potential for serious side effects to an unborn child. Talk to your health care professional or pharmacist for more information. Do not breast-feed an infant while taking this medicine. Men should inform their doctor if they wish to father a child. This medicine may lower sperm counts. Do not treat diarrhea with over the counter products. Contact your doctor if you have diarrhea that lasts more than 2 days or if it is severe and watery. This medicine can make you more sensitive to the sun. Keep out of the sun. If you cannot avoid being in the sun, wear protective clothing and use sunscreen. Do not use sun lamps or tanning beds/booths. What side effects may I notice from receiving this medication? Side effects that you should report to your doctor or health care professional as soon as possible: allergic reactions like skin rash, itching or hives, swelling of the face, lips, or tongue low blood counts - this medicine may decrease the number of white blood cells, red blood cells and platelets. You may be at increased risk for infections and bleeding. signs of infection - fever or chills, cough, sore throat, pain or difficulty passing urine signs of decreased platelets or bleeding - bruising, pinpoint red spots on  the skin, black, tarry stools, blood in the urine signs of decreased red blood cells - unusually weak or tired, fainting spells, lightheadedness breathing problems changes in vision chest pain mouth sores nausea and vomiting pain, swelling, redness at site where injected pain, tingling, numbness in the hands or feet redness, swelling, or sores on hands or feet stomach pain unusual bleeding Side effects that usually do not require medical attention (report to your doctor or health care professional if they continue or are bothersome): changes in finger or toe nails diarrhea dry or itchy skin hair loss headache loss of appetite sensitivity of eyes to the light stomach upset unusually teary eyes This list may not describe all possible side effects. Call your doctor for medical advice about side effects. You may report side effects to FDA at 1-800-FDA-1088. Where should I keep my medication? This drug is given in a hospital or clinic and will not be stored at home. NOTE: This sheet is a summary. It may not cover all possible information. If you have questions about this medicine, talk to your doctor, pharmacist, or health care provider.  2023 Elsevier/Gold Standard (2021-01-21 00:00:00) Methotrexate Injection What is this medication? METHOTREXATE (METH oh TREX ate) treats inflammatory conditions such as arthritis and psoriasis. It works by decreasing inflammation, which can reduce pain and prevent long-term injury to the joints and  skin. It may also be used to treat some types of cancer. It works by slowing down the growth of cancer cells. This medicine may be used for other purposes; ask your health care provider or pharmacist if you have questions. What should I tell my care team before I take this medication? They need to know if you have any of these conditions: Fluid in the stomach area or lungs If you often drink alcohol Infection or immune system problems Kidney disease Liver  disease Low blood counts (white cells, platelets, or red blood cells) Lung disease Recent or ongoing radiation Recent or upcoming vaccine Stomach ulcers Ulcerative colitis An unusual or allergic reaction to methotrexate, other medications, foods, dyes, or preservatives Pregnant or trying to get pregnant Breast-feeding How should I use this medication? This medication is for infusion into a vein or for injection into muscle or into the spinal fluid (whichever applies). It is usually given in a hospital or clinic setting. In rare cases, you might get this medication at home. You will be taught how to give this medication. Use exactly as directed. Take your medication at regular intervals. Do not take your medication more often than directed. If this medication is used for arthritis or psoriasis, it should be taken weekly, NOT daily. It is important that you put your used needles and syringes in a special sharps container. Do not put them in a trash can. If you do not have a sharps container, call your pharmacist or care team to get one. Talk to your care team about the use of this medication in children. While this medication may be prescribed for children as young as 2 years for selected conditions, precautions do apply. Overdosage: If you think you have taken too much of this medicine contact a poison control center or emergency room at once. NOTE: This medicine is only for you. Do not share this medicine with others. What if I miss a dose? It is important not to miss your dose. Call your care team if you are unable to keep an appointment. If you give yourself the medication, and you miss a dose, talk with your care team. Do not take double or extra doses. What may interact with this medication? Do not take this medication with any of the following: Acitretin This medication may also interact with the following: Aspirin or aspirin-like medications including salicylates Azathioprine Certain  antibiotics like chloramphenicol, penicillin, tetracycline Certain medications that treat or prevent blood clots like warfarin, apixaban, dabigatran, and rivaroxaban Certain medications for stomach problems like esomeprazole, omeprazole, pantoprazole Cyclosporine Dapsone Diuretics Folic acid Gold Hydroxychloroquine Live virus vaccines Medications for infection like acyclovir, adefovir, amphotericin B, bacitracin, cidofovir, foscarnet, ganciclovir, gentamicin, pentamidine, vancomycin Mercaptopurine NSAIDs, medications for pain and inflammation, like ibuprofen or naproxen Pamidronate Pemetrexed Penicillamine Phenylbutazone Phenytoin Probenecid Pyrimethamine Retinoids such as isotretinoin and tretinoin Steroid medications like prednisone or cortisone Sulfonamides like sulfasalazine and trimethoprim/sulfamethoxazole Theophylline Zoledronic acid This list may not describe all possible interactions. Give your health care provider a list of all the medicines, herbs, non-prescription drugs, or dietary supplements you use. Also tell them if you smoke, drink alcohol, or use illegal drugs. Some items may interact with your medicine. What should I watch for while using this medication? This medication may make you feel generally unwell. This is not uncommon as chemotherapy can affect healthy cells as well as cancer cells. Report any side effects. Continue your course of treatment even though you feel ill unless your care team tells  you to stop. Your condition will be monitored carefully while you are receiving this medication. Avoid alcoholic drinks. This medication can cause serious side effects. To reduce the risk, your care team may give you other medications to take before receiving this one. Be sure to follow the directions from your care team. This medication can make you more sensitive to the sun. Keep out of the sun. If you cannot avoid being in the sun, wear protective clothing and use  sunscreen. Do not use sun lamps or tanning beds/booths. You may get drowsy or dizzy. Do not drive, use machinery, or do anything that needs mental alertness until you know how this medication affects you. Do not stand or sit up quickly, especially if you are an older patient. This reduces the risk of dizzy or fainting spells. You may need blood work while you are taking this medication. Call your care team for advice if you get a fever, chills or sore throat, or other symptoms of a cold or flu. Do not treat yourself. This medication decreases your body's ability to fight infections. Try to avoid being around people who are sick. This medication may increase your risk to bruise or bleed. Call your care team if you notice any unusual bleeding. Be careful brushing or flossing your teeth or using a toothpick because you may get an infection or bleed more easily. If you have any dental work done, tell your dentist you are receiving this medication Check with your care team if you get an attack of severe diarrhea, nausea and vomiting, or if you sweat a lot. The loss of too much body fluid can make it dangerous for you to take this medication. Talk to your care team about your risk of cancer. You may be more at risk for certain types of cancers if you take this medication. Do not become pregnant while taking this medication or for 6 months after stopping it. Women should inform their care team if they wish to become pregnant or think they might be pregnant. Men should not father a child while taking this medication and for 3 months after stopping it. There is potential for serious harm to an unborn child. Talk to your care team for more information. Do not breast-feed an infant while taking this medication or for 1 week after stopping it. This medication may make it more difficult to get pregnant or father a child. Talk to your care team if you are concerned about your fertility. What side effects may I notice  from receiving this medication? Side effects that you should report to your care team as soon as possible: Allergic reactions--skin rash, itching, hives, swelling of the face, lips, tongue, or throat Blood clot--pain, swelling, or warmth in the leg, shortness of breath, chest pain Dry cough, shortness of breath or trouble breathing Infection--fever, chills, cough, sore throat, wounds that don't heal, pain or trouble when passing urine, general feeling of discomfort or being unwell Kidney injury--decrease in the amount of urine, swelling of the ankles, hands, or feet Liver injury--right upper belly pain, loss of appetite, nausea, light-colored stool, dark yellow or brown urine, yellowing of the skin or eyes, unusual weakness or fatigue Low red blood cell count--unusual weakness or fatigue, dizziness, headache, trouble breathing Redness, blistering, peeling, or loosening of the skin, including inside the mouth Seizures Unusual bruising or bleeding Side effects that usually do not require medical attention (report to your care team if they continue or are bothersome): Diarrhea Dizziness Hair  loss Nausea Pain, redness, or swelling with sores inside the mouth or throat Vomiting This list may not describe all possible side effects. Call your doctor for medical advice about side effects. You may report side effects to FDA at 1-800-FDA-1088. Where should I keep my medication? This medication is given in a hospital or clinic. It will not be stored at home. NOTE: This sheet is a summary. It may not cover all possible information. If you have questions about this medicine, talk to your doctor, pharmacist, or health care provider.  2023 Elsevier/Gold Standard (2020-04-26 00:00:00) Fluorouracil, 5-FU injection What is this medication? FLUOROURACIL, 5-FU (flure oh YOOR a sil) is a chemotherapy drug. It slows the growth of cancer cells. This medicine is used to treat many types of cancer like breast  cancer, colon or rectal cancer, pancreatic cancer, and stomach cancer. This medicine may be used for other purposes; ask your health care provider or pharmacist if you have questions. COMMON BRAND NAME(S): Adrucil What should I tell my care team before I take this medication? They need to know if you have any of these conditions: blood disorders dihydropyrimidine dehydrogenase (DPD) deficiency infection (especially a virus infection such as chickenpox, cold sores, or herpes) kidney disease liver disease malnourished, poor nutrition recent or ongoing radiation therapy an unusual or allergic reaction to fluorouracil, other chemotherapy, other medicines, foods, dyes, or preservatives pregnant or trying to get pregnant breast-feeding How should I use this medication? This drug is given as an infusion or injection into a vein. It is administered in a hospital or clinic by a specially trained health care professional. Talk to your pediatrician regarding the use of this medicine in children. Special care may be needed. Overdosage: If you think you have taken too much of this medicine contact a poison control center or emergency room at once. NOTE: This medicine is only for you. Do not share this medicine with others. What if I miss a dose? It is important not to miss your dose. Call your doctor or health care professional if you are unable to keep an appointment. What may interact with this medication? Do not take this medicine with any of the following medications: live virus vaccines This medicine may also interact with the following medications: medicines that treat or prevent blood clots like warfarin, enoxaparin, and dalteparin This list may not describe all possible interactions. Give your health care provider a list of all the medicines, herbs, non-prescription drugs, or dietary supplements you use. Also tell them if you smoke, drink alcohol, or use illegal drugs. Some items may interact  with your medicine. What should I watch for while using this medication? Visit your doctor for checks on your progress. This drug may make you feel generally unwell. This is not uncommon, as chemotherapy can affect healthy cells as well as cancer cells. Report any side effects. Continue your course of treatment even though you feel ill unless your doctor tells you to stop. In some cases, you may be given additional medicines to help with side effects. Follow all directions for their use. Call your doctor or health care professional for advice if you get a fever, chills or sore throat, or other symptoms of a cold or flu. Do not treat yourself. This drug decreases your body's ability to fight infections. Try to avoid being around people who are sick. This medicine may increase your risk to bruise or bleed. Call your doctor or health care professional if you notice any unusual bleeding. Be  careful brushing and flossing your teeth or using a toothpick because you may get an infection or bleed more easily. If you have any dental work done, tell your dentist you are receiving this medicine. Avoid taking products that contain aspirin, acetaminophen, ibuprofen, naproxen, or ketoprofen unless instructed by your doctor. These medicines may hide a fever. Do not become pregnant while taking this medicine. Women should inform their doctor if they wish to become pregnant or think they might be pregnant. There is a potential for serious side effects to an unborn child. Talk to your health care professional or pharmacist for more information. Do not breast-feed an infant while taking this medicine. Men should inform their doctor if they wish to father a child. This medicine may lower sperm counts. Do not treat diarrhea with over the counter products. Contact your doctor if you have diarrhea that lasts more than 2 days or if it is severe and watery. This medicine can make you more sensitive to the sun. Keep out of the  sun. If you cannot avoid being in the sun, wear protective clothing and use sunscreen. Do not use sun lamps or tanning beds/booths. What side effects may I notice from receiving this medication? Side effects that you should report to your doctor or health care professional as soon as possible: allergic reactions like skin rash, itching or hives, swelling of the face, lips, or tongue low blood counts - this medicine may decrease the number of white blood cells, red blood cells and platelets. You may be at increased risk for infections and bleeding. signs of infection - fever or chills, cough, sore throat, pain or difficulty passing urine signs of decreased platelets or bleeding - bruising, pinpoint red spots on the skin, black, tarry stools, blood in the urine signs of decreased red blood cells - unusually weak or tired, fainting spells, lightheadedness breathing problems changes in vision chest pain mouth sores nausea and vomiting pain, swelling, redness at site where injected pain, tingling, numbness in the hands or feet redness, swelling, or sores on hands or feet stomach pain unusual bleeding Side effects that usually do not require medical attention (report to your doctor or health care professional if they continue or are bothersome): changes in finger or toe nails diarrhea dry or itchy skin hair loss headache loss of appetite sensitivity of eyes to the light stomach upset unusually teary eyes This list may not describe all possible side effects. Call your doctor for medical advice about side effects. You may report side effects to FDA at 1-800-FDA-1088. Where should I keep my medication? This drug is given in a hospital or clinic and will not be stored at home. NOTE: This sheet is a summary. It may not cover all possible information. If you have questions about this medicine, talk to your doctor, pharmacist, or health care provider.  2023 Elsevier/Gold Standard (2021-01-21  00:00:00)       To help prevent nausea and vomiting after your treatment, we encourage you to take your nausea medication as directed.  BELOW ARE SYMPTOMS THAT SHOULD BE REPORTED IMMEDIATELY: *FEVER GREATER THAN 100.4 F (38 C) OR HIGHER *CHILLS OR SWEATING *NAUSEA AND VOMITING THAT IS NOT CONTROLLED WITH YOUR NAUSEA MEDICATION *UNUSUAL SHORTNESS OF BREATH *UNUSUAL BRUISING OR BLEEDING *URINARY PROBLEMS (pain or burning when urinating, or frequent urination) *BOWEL PROBLEMS (unusual diarrhea, constipation, pain near the anus) TENDERNESS IN MOUTH AND THROAT WITH OR WITHOUT PRESENCE OF ULCERS (sore throat, sores in mouth, or a toothache) UNUSUAL RASH,  SWELLING OR PAIN  UNUSUAL VAGINAL DISCHARGE OR ITCHING   Items with * indicate a potential emergency and should be followed up as soon as possible or go to the Emergency Department if any problems should occur.  Please show the CHEMOTHERAPY ALERT CARD or IMMUNOTHERAPY ALERT CARD at check-in to the Emergency Department and triage nurse.  Should you have questions after your visit or need to cancel or reschedule your appointment, please contact [facility]  Dept: [phone]  and follow the prompts.  Office hours are 8:00 a.m. to 4:30 p.m. Monday - Friday. Please note that voicemails left after 4:00 p.m. may not be returned until the following business day.  We are closed weekends and major holidays. You have access to a nurse at all times for urgent questions. Please call the main number to the clinic Dept: [phone] and follow the prompts.  For any non-urgent questions, you may also contact your provider using MyChart. We now offer e-Visits for anyone 60 and older to request care online for non-urgent symptoms. For details visit [email or web address].   Also download the MyChart app! Go to the app store, search "MyChart", open the app, select [facility], and log in with your MyChart username and password.  Masks  are optional in the cancer centers. If you would like for your care team to wear a mask while they are taking care of you, please let them know. For doctor visits, patients may have with them one support person who is at least 81 years old. At this time, visitors are not allowed in the infusion area.

## 2021-09-30 ENCOUNTER — Telehealth: Payer: Self-pay

## 2021-09-30 NOTE — Telephone Encounter (Signed)
Chemo follow up call- no answer

## 2021-10-03 ENCOUNTER — Telehealth: Payer: Self-pay

## 2021-10-03 NOTE — Telephone Encounter (Signed)
Chemo follow up call, patient states she feels well. Has had mild nausea which she has treated with "ginger drop" with good relief. Denies and fatigue, vomiting, diarrhea or other s/s

## 2021-10-10 ENCOUNTER — Inpatient Hospital Stay: Payer: Medicare HMO | Attending: Oncology | Admitting: Hematology and Oncology

## 2021-10-10 ENCOUNTER — Inpatient Hospital Stay: Payer: Medicare HMO

## 2021-10-10 ENCOUNTER — Encounter: Payer: Self-pay | Admitting: Hematology and Oncology

## 2021-10-10 DIAGNOSIS — K5900 Constipation, unspecified: Secondary | ICD-10-CM | POA: Insufficient documentation

## 2021-10-10 DIAGNOSIS — Z170 Estrogen receptor positive status [ER+]: Secondary | ICD-10-CM | POA: Insufficient documentation

## 2021-10-10 DIAGNOSIS — R110 Nausea: Secondary | ICD-10-CM | POA: Insufficient documentation

## 2021-10-10 DIAGNOSIS — D6959 Other secondary thrombocytopenia: Secondary | ICD-10-CM | POA: Insufficient documentation

## 2021-10-10 DIAGNOSIS — C50412 Malignant neoplasm of upper-outer quadrant of left female breast: Secondary | ICD-10-CM | POA: Insufficient documentation

## 2021-10-10 DIAGNOSIS — Z5111 Encounter for antineoplastic chemotherapy: Secondary | ICD-10-CM | POA: Insufficient documentation

## 2021-10-10 DIAGNOSIS — N6489 Other specified disorders of breast: Secondary | ICD-10-CM | POA: Insufficient documentation

## 2021-10-10 DIAGNOSIS — D649 Anemia, unspecified: Secondary | ICD-10-CM | POA: Diagnosis not present

## 2021-10-10 DIAGNOSIS — D709 Neutropenia, unspecified: Secondary | ICD-10-CM | POA: Insufficient documentation

## 2021-10-10 DIAGNOSIS — M85859 Other specified disorders of bone density and structure, unspecified thigh: Secondary | ICD-10-CM | POA: Insufficient documentation

## 2021-10-10 LAB — CBC AND DIFFERENTIAL
HCT: 40 (ref 36–46)
Hemoglobin: 12.9 (ref 12.0–16.0)
Neutrophils Absolute: 1.25
Platelets: 125 10*3/uL — AB (ref 150–400)
WBC: 2.6

## 2021-10-10 LAB — BASIC METABOLIC PANEL
BUN: 20 (ref 4–21)
CO2: 30 — AB (ref 13–22)
Chloride: 102 (ref 99–108)
Creatinine: 0.8 (ref 0.5–1.1)
Glucose: 93
Potassium: 3.9 mEq/L (ref 3.5–5.1)
Sodium: 137 (ref 137–147)

## 2021-10-10 LAB — COMPREHENSIVE METABOLIC PANEL
Albumin: 3.9 (ref 3.5–5.0)
Calcium: 9.3 (ref 8.7–10.7)

## 2021-10-10 LAB — HEPATIC FUNCTION PANEL
ALT: 21 U/L (ref 7–35)
AST: 31 (ref 13–35)
Alkaline Phosphatase: 80 (ref 25–125)
Bilirubin, Total: 1.1

## 2021-10-10 LAB — CBC: RBC: 4.34 (ref 3.87–5.11)

## 2021-10-10 NOTE — Assessment & Plan Note (Addendum)
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  Pathology revealed a 28 mm with 2 negative nodes in the background of high-grade ductal carcinoma in situ.  Margins were clear.  Estrogen receptors positive at 30% and progesterone receptors are negative and HER2 negative.  Ki-67 was 60%.    She was felt to be at higher risk for recurrence, so we discussed chemotherapy such as TC for 4 cycles.  However, we would have to take into consideration her advanced age of 36.  Her EndoPredict score then came back at 5.2, correlating with a 48% risk of distant recurrence in the next 10 years. She would have 24% benefit with chemotherapy.  It was decided to give her adjuvant CMF chemotherapy, which is an older and usually a more easily tolerated regimen.  She received her first cycle on July 27 and tolerated this well.  She has mild nausea controlled with ginger.  She reports constipation, for which she is taking Colace.  I recommended she take Senokot-S 2 tablets twice daily and when she is more regular decreased to 2 tablets at bedtime.  She has mild neutropenia and thrombocytopenia secondary to chemotherapy.  This should recover to baseline by the time of her next infusion.  We will plan to see her back on July 15 for repeat clinical assessment prior to second cycle of CMF chemotherapy

## 2021-10-10 NOTE — Progress Notes (Signed)
[facility]  [address] [place],  [place]  [address] [phone]  Clinic Day:  10/10/2021  Referring physician: [name], MD  ASSESSMENT & PLAN:   Assessment & Plan: Breast cancer of upper-outer quadrant of left female breast ([place]) Stage IIb (T2 N0 M0) grade 3, infiltrating ductal carcinoma.  Pathology revealed a 28 mm with 2 negative nodes in the background of high-grade ductal carcinoma in situ.  Margins were clear.  Estrogen receptors positive at 30% and progesterone receptors are negative and HER2 negative.  Ki-67 was 60%.    She was felt to be at higher risk for recurrence, so we discussed chemotherapy such as TC for 4 cycles.  However, we would have to take into consideration her advanced age of 64.  Her EndoPredict score then came back at 5.2, correlating with a 48% risk of distant recurrence in the next 10 years. She would have 24% benefit with chemotherapy.  It was decided to give her adjuvant CMF chemotherapy, which is an older and usually a more easily tolerated regimen.  She received her first cycle on July 27 and tolerated this well.  She has mild nausea controlled with ginger.  She reports constipation, for which she is taking Colace.  I recommended she take Senokot-S 2 tablets twice daily and when she is more regular decreased to 2 tablets at bedtime.  She has mild neutropenia and thrombocytopenia secondary to chemotherapy.  This should recover to baseline by the time of her next infusion.  We will plan to see her back on July 15 for repeat clinical assessment prior to second cycle of CMF chemotherapy   The patient understands the plans discussed today and is in agreement with them.  She knows to contact our office if she develops concerns prior to her next appointment.   I provided 15 minutes of face-to-face time during this encounter and > 50% was spent counseling as documented under my assessment and plan.    [name], PA-C   [facility] [address] [place] [address] Dept: [phone] Dept Fax: [phone]   Orders Placed This Encounter  Procedures   CBC and differential    This external order was created through the Results Console.   CBC    This external order was created through the Results Console.   Basic metabolic panel    This external order was created through the Results Console.   Comprehensive metabolic panel    This external order was created through the Results Console.   Hepatic function panel    This external order was created through the Results Console.      CHIEF COMPLAINT:  CC: Stage IIB hormone receptor positive breast cancer  Current Treatment: Adjuvant cyclophosphamide/methotrexate/5-fluorouracil every 3 weeks  HISTORY OF PRESENT ILLNESS:   Oncology History  Breast cancer of upper-outer quadrant of left female breast ([place])  06/01/2021 Initial Diagnosis   Breast cancer of upper-outer quadrant of left female breast ([place])   07/13/2021 Cancer Staging   Staging form: Breast, AJCC 8th Edition - Clinical stage from 07/13/2021: Stage IIB (cT2, cN0(sn), cM0, G3, ER+, PR-, HER2-) - Signed by [name], MD on 08/10/2021 Histopathologic type: Infiltrating duct carcinoma, NOS Stage prefix: Initial diagnosis Method of lymph node assessment: Sentinel lymph node biopsy Nuclear grade: G3 Histologic grading system: 3 grade system Laterality: Left Tumor size (mm): 28 Lymph-vascular invasion (LVI): LVI not present (absent)/not identified Diagnostic  confirmation: Positive histology Specimen type: Excision Staged by: Managing physician Percentage of positive estrogen receptors (%): 30 Percentage of positive progesterone receptors (%): 0 HER2-IHC interpretation: Negative HER2-IHC value: Score 1+ Menopausal status: Postmenopausal Ki-67 (%): 60 Stage used in treatment planning: Yes National guidelines used  in treatment planning: Yes Type of national guideline used in treatment planning: NCCN   09/29/2021 -  Chemotherapy   Patient is on Treatment Plan : BREAST Adjuvant CMF IV q21d         INTERVAL HISTORY:  [name] is here today for repeat clinical assessment to see how she tolerated her first cycle of adjuvant chemotherapy with cyclophosphamide/methotrexate/5-fluorouracil (CMF).  She states she tolerated this quite well.  She has had some intermittent nausea for which ginger was effective.  She reports constipation, for which she is taking Colace with some improvement.  She denies fevers or chills. She denies pain. Her appetite is good. Her weight has been stable.  REVIEW OF SYSTEMS:  Review of Systems  Constitutional:  Negative for appetite change, chills, fatigue, fever and unexpected weight change.  HENT:   Negative for lump/mass, mouth sores and sore throat.   Respiratory:  Negative for cough and shortness of breath.   Cardiovascular:  Negative for chest pain and leg swelling.  Gastrointestinal:  Negative for abdominal pain, constipation, diarrhea, nausea and vomiting.  Endocrine: Negative for hot flashes.  Genitourinary:  Negative for difficulty urinating, dysuria, frequency and hematuria.   Musculoskeletal:  Negative for arthralgias, back pain and myalgias.  Skin:  Negative for rash.  Neurological:  Negative for dizziness and headaches.  Hematological:  Negative for adenopathy. Does not bruise/bleed easily.  Psychiatric/Behavioral:  Negative for depression and sleep disturbance. The patient is not nervous/anxious.      VITALS:  Blood pressure (!) 179/93, pulse (!) 58, temperature 98.6 F (37 C), resp. rate 14, height 5' 2.4" (1.585 m), weight 137 lb 4.8 oz (62.3 kg), SpO2 96 %.  Wt Readings from Last 3 Encounters:  10/10/21 137 lb 4.8 oz (62.3 kg)  09/29/21 137 lb 1.3 oz (62.2 kg)  09/27/21 137 lb 9.6 oz (62.4 kg)    Body mass index is 24.79 kg/m.  Performance status (ECOG):  0 - Asymptomatic  PHYSICAL EXAM:  Physical Exam Vitals and nursing note reviewed.  Constitutional:      General: She is not in acute distress.    Appearance: Normal appearance.  HENT:     Head: Normocephalic and atraumatic.     Mouth/Throat:     Mouth: Mucous membranes are moist.     Pharynx: Oropharynx is clear. No oropharyngeal exudate or posterior oropharyngeal erythema.  Eyes:     General: No scleral icterus.    Extraocular Movements: Extraocular movements intact.     Conjunctiva/sclera: Conjunctivae normal.     Pupils: Pupils are equal, round, and reactive to light.  Cardiovascular:     Rate and Rhythm: Normal rate and regular rhythm.     Heart sounds: Normal heart sounds. No murmur heard.    No friction rub. No gallop.  Pulmonary:     Effort: Pulmonary effort is normal.     Breath sounds: Normal breath sounds. No wheezing, rhonchi or rales.  Abdominal:     General: There is no distension.     Palpations: Abdomen is soft. There is no hepatomegaly, splenomegaly or mass.     Tenderness: There is no abdominal tenderness.  Musculoskeletal:        General: Normal range of motion.  Cervical back: Normal range of motion and neck supple. No tenderness.     Right lower leg: No edema.     Left lower leg: No edema.  Lymphadenopathy:     Cervical: No cervical adenopathy.     Upper Body:     Right upper body: No supraclavicular or axillary adenopathy.     Left upper body: No supraclavicular or axillary adenopathy.     Lower Body: No right inguinal adenopathy. No left inguinal adenopathy.  Skin:    General: Skin is warm and dry.     Coloration: Skin is not jaundiced.     Findings: No rash.  Neurological:     Mental Status: She is alert and oriented to person, place, and time.     Cranial Nerves: No cranial nerve deficit.  Psychiatric:        Mood and Affect: Mood normal.        Behavior: Behavior normal.        Thought Content: Thought content normal.    LABS:       Latest Ref Rng & Units 10/10/2021   12:00 AM 08/11/2021   12:00 AM 01/27/2019    6:25 PM  CBC  WBC  2.6     5.7     5.8   Hemoglobin 12.0 - 16.0 12.9     13.3     14.7   Hematocrit 36 - 46 40     42     44.5   Platelets 150 - 400 K/uL 125     152     156      This result is from an external source.      Latest Ref Rng & Units 10/10/2021   12:00 AM 08/11/2021   12:00 AM 01/27/2019    6:25 PM  CMP  Glucose 70 - 99 mg/dL   100   BUN 4 - '21 20     21     12   ' Creatinine 0.5 - 1.1 0.8     0.8     0.79   Sodium 137 - 147 137     141     140   Potassium 3.5 - 5.1 mEq/L 3.9     3.9     4.1   Chloride 99 - 108 102     103     106   CO2 13 - 22 30     32     27   Calcium 8.7 - 10.7 9.3     9.0     9.5   Alkaline Phos 25 - 125 80     89       AST 13 - 35 31     39       ALT 7 - 35 U/L 21     25          This result is from an external source.     No results found for: "CEA1", "CEA" / No results found for: "CEA1", "CEA" No results found for: "PSA1" No results found for: "TLX726" No results found for: "CAN125"  No results found for: "TOTALPROTELP", "ALBUMINELP", "A1GS", "A2GS", "BETS", "BETA2SER", "GAMS", "MSPIKE", "SPEI" No results found for: "TIBC", "FERRITIN", "IRONPCTSAT" No results found for: "LDH"  STUDIES:  No results found.    HISTORY:   Past Medical History:  Diagnosis Date   H/O mitral valve repair    HTN (hypertension)    Status post left breast lumpectomy  Past Surgical History:  Procedure Laterality Date   TONSILLECTOMY      Family History  Problem Relation Age of Onset   Cancer Mother    Cancer Maternal Aunt     Social History:  reports that she has never smoked. She has never used smokeless tobacco. She reports that she does not drink alcohol and does not use drugs.The patient is accompanied by her husband today.  Allergies: No Known Allergies  Current Medications: Current Outpatient Medications  Medication Sig Dispense Refill   amoxicillin  (AMOXIL) 500 MG capsule Prn prior to dentist visit with hx of mitral valve prolapse     Calcium Citrate-Vitamin D 315-5 MG-MCG TABS Take 1 tablet by mouth daily.     Cholecalciferol 25 MCG (1000 UT) capsule Take by mouth.     Coenzyme Q10 50 MG CAPS Take by mouth.     glucosamine-chondroitin 500-400 MG tablet Take 1 tablet by mouth daily.     levocetirizine (XYZAL) 5 MG tablet Take 5 mg by mouth daily.     losartan (COZAAR) 100 MG tablet Take 100 mg by mouth daily.     metoprolol tartrate (LOPRESSOR) 25 MG tablet Take by mouth.     multivitamin-lutein (OCUVITE-LUTEIN) CAPS capsule Take 1 capsule by mouth daily.     ondansetron (ZOFRAN) 8 MG tablet Take 1 tablet (8 mg total) by mouth 2 (two) times daily as needed for refractory nausea / vomiting (Start on day 3 after chemotherapy). 30 tablet 1   prochlorperazine (COMPAZINE) 10 MG tablet Take 1 tablet (10 mg total) by mouth every 6 (six) hours as needed for nausea or vomiting. 30 tablet 1   simvastatin (ZOCOR) 20 MG tablet Take 20 mg by mouth at bedtime.     No current facility-administered medications for this visit.

## 2021-10-12 ENCOUNTER — Encounter: Payer: Self-pay | Admitting: Oncology

## 2021-10-17 ENCOUNTER — Encounter: Payer: Self-pay | Admitting: Oncology

## 2021-10-17 NOTE — Progress Notes (Signed)
[facility] CONSULT NOTE DATE: 09/16/2021   Patient Care Team: [name], MD as PCP - General (Family Medicine) [name], RN as Registered Nurse   Assessment & Plan   Stage IIB left breast cancer This is a grade 3 infiltrating ductal carcinoma with a T2 N0 M0 measuring 28 mm with 2 negative nodes.  She also has high-grade ductal carcinoma in situ.  Margins are clear.  Estrogen receptors positive at 30% and progesterone receptors are negative with HER2 negative and a Ki-67 of 60%.  I feel she is at higher risk for recurrence and discussed chemotherapy such as TC for 4 cycles.  However we have to take into consideration her advanced age of 78 and whether she would tolerate treatment.  Her EndoPredict score came back at 5.2, correlating with a 48% risk of distant recurrence in the next 10 years. She would have 24% benefit with chemotherapy. We have discussed the risks and benefits of TC versus CMF. She will take time to think it over and reach out at the beginning of next week.   Osteopenia Bone density scan was done in September 2022 and reveals osteopenia of the hip with a T score of -2.0.  The spine readings were normal.  She was offered Fosamax but declined.  Large seroma/hematoma of the left breast This is stable  Positive family history for breast cancer She has 2 maternal aunts and a maternal cousin as well as a first paternal cousin all with breast cancer, primarily in their 4s and 80s.  I discussed the option of genetic testing but she seems not inclined to pursue this since she has no children of her own and is 48 years old, so it would not likely change her management.   Plan: I explained that I would normally recommend adjuvant chemotherapy for a stage IIB, especially with low estrogen receptors and grade 3 histology.  However she is 81 years old and is hesitant to consider this, understandably. We reviewed the EndoPredict results with her and her husband  and the implications for her risk of recurrence. We discussed the various options of chemotherapy such as TC or CMF and the potential toxicities.  They will take time to think this over. She is already over 2 months post-op so would need to start within a month.  I discussed the assessment and treatment plan with the patient.  The patient was provided an opportunity to ask questions and all were answered.  The patient agreed with the plan and demonstrated an understanding of the instructions.  The patient was advised to call back if the symptoms worsen or if the condition fails to improve as anticipated.   Thank you for the opportunity to participate in the care of your patients.           [name], MD [place] [address] [place] [address] Dept: [phone] Dept Fax: [phone]    No orders of the defined types were placed in this encounter.        CHIEF COMPLAINTS/PURPOSE OF CONSULTATION:  Left breast cancer   HISTORY OF PRESENTING ILLNESS:  Ashley Villa 81 y.o. female is here because of recent diagnosis of left breast carcinoma. The cancer was detected by screening mammogram on March 6 which showed a possible mass. The cancer was not palpable prior to diagnosis.  She was sent for diagnostic mammogram on March 23 and this confirmed  a persistent mass in the slightly upper left breast, middle depth.  An ultrasound confirmed a 1.6 cm irregular hypoechoic mass at 12:00, 2 cm from the nipple.  She then had a biopsy performed on March 29 which revealed invasive ductal carcinoma.  Estrogen receptors were positive at 30% with progesterone receptors negative and HER2 negative although it did show 1+ on immuno histochemistry.  Ki 67 with 60%.  She was referred to Dr. Jerel Shepherd who performed a lumpectomy on May 10.  The final pathology revealed a grade 3 invasive ductal carcinoma with high-grade ductal  carcinoma in situ.  Margins were clear and 2 sentinel nodes were negative.  The final measurement was 28 mm for a T2 N0 M0.  She does have yearly mammograms.   I reviewed her records extensively and collaborated the history with the patient.   SUMMARY OF ONCOLOGIC HISTORY:    Oncology History  Breast cancer of upper-outer quadrant of left female breast ([facility])  06/01/2021 Initial Diagnosis    Breast cancer of upper-outer quadrant of left female breast ([facility])    07/13/2021 Cancer Staging    Staging form: Breast, AJCC 8th Edition - Clinical stage from 07/13/2021: Stage IIB (cT2, cN0(sn), cM0, G3, ER+, PR-, HER2-) - Signed by [name], MD on 08/10/2021 Histopathologic type: Infiltrating duct carcinoma, NOS Stage prefix: Initial diagnosis Method of lymph node assessment: Sentinel lymph node biopsy Nuclear grade: G3 Histologic grading system: 3 grade system Laterality: Left Tumor size (mm): 28 Lymph-vascular invasion (LVI): LVI not present (absent)/not identified Diagnostic confirmation: Positive histology Specimen type: Excision Staged by: Managing physician Percentage of positive estrogen receptors (%): 30 Percentage of positive progesterone receptors (%): 0 HER2-IHC interpretation: Negative HER2-IHC value: Score 1+ Menopausal status: Postmenopausal Ki-67 (%): 60 Stage used in treatment planning: Yes National guidelines used in treatment planning: Yes Type of national guideline used in treatment planning: NCCN        In terms of breast cancer risk profile:  She menarched at early age of 38 and went to menopause at age 23 She had no pregnancies, is nulliparous She was on hormone replacement therapy for approximately 1 month She has a positive family history of Breast cancer in 2 maternal aunts and a first paternal cousin   INTERVAL HISTORY: Ashley Villa is here today for a routine follow up. The has a prior lumpectomy of the right breast for benign disease. She reports the  hematoma on her right breast  appeared stable today. Her appetite is good and she has lost 1 pound since her last visit.     Wt Readings from Last 3 Encounters:  09/16/21 138 lb 12.8 oz (63 kg)  08/11/21 139 lb 14.4 oz (63.5 kg)    She is here to discuss her EndoPredict results. She has a Epclin score of 5.2. This correlates with a 48% risk of distant recurrence in the next 10 years. It also shows a chemotherapy benefit of 24%, so quite significant. We discussed that with chemotherapy it reduces her risk to 24% of cancer coming back, without chemo she is at a 48% risk. We discussed various treatment options and reviewed the symptoms and toxicities of the treatment. I discussed the options of TC or CMF. It looks like she is leaning toward the more mild chemotherapy (CMF) treatment but has time to look over the various treatment options this weekend.  If we proceed with chemotherapy, I would plan to decrease her doses by 20% due to her age.  We would  plan to start at the end of July.  I called Dr.Lininger, he could likely get her port in this week if she decides to proceed. She denies any gastrointestinal or cardiorespiratory symptoms.  She denies fever, chills or other signs of infection.  She denies nausea, vomiting, bowel issues, or abdominal pain.  She denies sore throat, cough, dyspnea, or chest pain.     MEDICAL HISTORY:      Past Medical History:  Diagnosis Date   H/O mitral valve repair     HTN (hypertension)     Status post left breast lumpectomy    Osteoarthritis   SURGICAL HISTORY:      Past Surgical History:  Procedure Laterality Date   TONSILLECTOMY      History of mitral valve repair with right thoracotomy and 2019   SOCIAL HISTORY:   She is retired but worked in a bank. Social History         Socioeconomic History   Marital status: Married      Spouse name: Marcello Moores   Number of children: 0   Years of education: 12+1   Highest education level: High school graduate   Occupational History   Not on file  Tobacco Use   Smoking status: Never   Smokeless tobacco: Never  Vaping Use   Vaping Use: Never used  Substance and Sexual Activity   Alcohol use: Never   Drug use: Never   Sexual activity: Not Currently  Other Topics Concern   Not on file  Social History Narrative   Not on file    Social Determinants of Health    Financial Resource Strain: Not on file  Food Insecurity: Not on file  Transportation Needs: Not on file  Physical Activity: Not on file  Stress: Not on file  Social Connections: Not on file  Intimate Partner Violence: Not on file      FAMILY HISTORY:      Family History  Problem Relation Age of Onset   Cancer Mother     Cancer Maternal Aunt    2 maternal aunts had breast cancer in their 32s and 52s 1 maternal cousin had breast cancer in her late 43s, daughter of the patient's brother A first paternal cousin had breast cancer at age 58 A maternal cousin had pancreatic cancer and bladder cancer     ALLERGIES:  has No Known Allergies.   MEDICATIONS:        Current Outpatient Medications  Medication Sig Dispense Refill   Calcium Citrate-Vitamin D 315-5 MG-MCG TABS Take 1 tablet by mouth daily.       Cholecalciferol 25 MCG (1000 UT) capsule Take by mouth.       Coenzyme Q10 50 MG CAPS Take by mouth.       glucosamine-chondroitin 500-400 MG tablet Take 1 tablet by mouth daily.       levocetirizine (XYZAL) 5 MG tablet Take 5 mg by mouth daily.       losartan (COZAAR) 100 MG tablet Take 100 mg by mouth daily.       metoprolol tartrate (LOPRESSOR) 25 MG tablet Take by mouth.       simvastatin (ZOCOR) 20 MG tablet Take 20 mg by mouth at bedtime.        No current facility-administered medications for this visit.      REVIEW OF SYSTEMS:   Constitutional: Denies fevers, chills or abnormal night sweats Eyes: Denies blurriness of vision, double vision or watery eyes Ears, nose, mouth, throat, and  face: Denies mucositis or  sore throat Respiratory: Denies cough, dyspnea or wheezes Cardiovascular: Denies palpitation, chest discomfort or lower extremity swelling Gastrointestinal:  Denies nausea, heartburn or change in bowel habits Skin: Denies abnormal skin rashes Lymphatics: Denies new lymphadenopathy or easy bruising Neurological:Denies numbness, tingling or new weaknesses Behavioral/Psych: Mood is stable, no new changes  All other systems were reviewed with the patient and are negative.   PHYSICAL EXAMINATION: ECOG PERFORMANCE STATUS: 1 - Symptomatic but completely ambulatory   There were no vitals filed for this visit.   There were no vitals filed for this visit.     GENERAL:alert, no distress and comfortable SKIN: skin color, texture, turgor are normal, no rashes or significant lesions EYES: normal, conjunctiva are pink and non-injected, sclera clear OROPHARYNX:no exudate, no erythema and lips, buccal mucosa, and tongue normal  NECK: supple, thyroid normal size, non-tender, without nodularity LYMPH:  no palpable lymphadenopathy in the cervical, axillary or inguinal LUNGS: clear to auscultation and percussion with normal breathing effort HEART: regular rate & rhythm and no murmurs and no lower extremity edema ABDOMEN:abdomen soft, non-tender and normal bowel sounds Musculoskeletal:no cyanosis of digits and no clubbing  PSYCH: alert & oriented x 3 with fluent speech NEURO: no focal motor/sensory deficits BREASTS: She has a larage hematoma center or the right breast measuring 8-9 cm across, very firm non tender, no redness, no heat The left breast has a scar of the upper inner quadrant and a large 8 cm seroma No masses in either breast   LABORATORY DATA:  I have reviewed the data as listed Recent Labs       Lab Results  Component Value Date    WBC 5.7 08/11/2021    HGB 13.3 08/11/2021    HCT 42 08/11/2021    MCV 92.7 01/27/2019    PLT 152 08/11/2021      Recent Labs       Lab Results   Component Value Date    NA 141 08/11/2021    K 3.9 08/11/2021    CL 103 08/11/2021    CO2 32 (A) 08/11/2021        RADIOGRAPHIC STUDIES: I have personally reviewed the radiological images as listed and agreed with the findings in the report.      EXAM: 05/26/21 DIGITAL DIAGNOSTIC UNILATERAL LEFT MAMMOGRAM WITH TOMOSYNTHESIS AND  CAD; ULTRASOUND LEFT BREAST  TECHNIQUE:  Left digital diagnostic mammography and breast tomosynthesis was  performed. The images were evaluated with computer-aided detection.;  Targeted ultrasound examination of the left breast was performed.  COMPARISON: Previous exam(s).  ACR Breast Density Category b: There are scattered areas of  fibroglandular density.  FINDINGS:  2D/3D spot compression views of the LEFT breast demonstrate a  persistent mass with distortion in the slightly UPPER LEFT breast,  middle depth.  Targeted ultrasound is performed, showing a 1 x 0.7 x 1.6 cm  irregular hypoechoic mass at the 12 o'clock position of the LEFT  breast 2 cm from the nipple.  No abnormal LEFT axillary lymph nodes are noted.  IMPRESSION:  1. Highly suspicious 1.6 cm UPPER LEFT breast mass. Tissue sampling  is recommended.  2. No abnormal appearing LEFT axillary lymph nodes.  RECOMMENDATION:  Ultrasound-guided LEFT breast biopsy, which will be scheduled.  I have discussed the findings and recommendations with the patient.  If applicable, a reminder letter will be sent to the patient  regarding the next appointment.  BI-RADS CATEGORY 5: Highly suggestive of malignancy.    Electronically  Signed  By: Margarette Canada M.D.  On: 05/26/2021 15:43        I,Gabriella Ballesteros,acting as a scribe for [name], MD.,have documented all relevant documentation on the behalf of [name], MD,as directed by  [name], MD while in the presence of [name], MD.

## 2021-10-18 ENCOUNTER — Other Ambulatory Visit: Payer: Self-pay | Admitting: Pharmacist

## 2021-10-18 ENCOUNTER — Encounter: Payer: Self-pay | Admitting: Oncology

## 2021-10-18 ENCOUNTER — Inpatient Hospital Stay: Payer: Medicare HMO | Admitting: Oncology

## 2021-10-18 ENCOUNTER — Inpatient Hospital Stay: Payer: Medicare HMO

## 2021-10-18 VITALS — BP 148/78 | HR 56 | Temp 97.5°F | Resp 18 | Ht 62.4 in | Wt 137.7 lb

## 2021-10-18 DIAGNOSIS — Z170 Estrogen receptor positive status [ER+]: Secondary | ICD-10-CM

## 2021-10-18 DIAGNOSIS — C50412 Malignant neoplasm of upper-outer quadrant of left female breast: Secondary | ICD-10-CM | POA: Diagnosis not present

## 2021-10-18 DIAGNOSIS — D649 Anemia, unspecified: Secondary | ICD-10-CM | POA: Diagnosis not present

## 2021-10-18 LAB — CBC AND DIFFERENTIAL
HCT: 40 (ref 36–46)
Hemoglobin: 13.1 (ref 12.0–16.0)
Neutrophils Absolute: 0.88
Platelets: 122 10*3/uL — AB (ref 150–400)
WBC: 2.5

## 2021-10-18 LAB — BASIC METABOLIC PANEL
BUN: 17 (ref 4–21)
CO2: 31 — AB (ref 13–22)
Chloride: 104 (ref 99–108)
Creatinine: 0.9 (ref 0.5–1.1)
Glucose: 111
Potassium: 4 mEq/L (ref 3.5–5.1)
Sodium: 138 (ref 137–147)

## 2021-10-18 LAB — CBC: RBC: 4.38 (ref 3.87–5.11)

## 2021-10-18 LAB — HEPATIC FUNCTION PANEL
ALT: 24 U/L (ref 7–35)
AST: 33 (ref 13–35)
Alkaline Phosphatase: 87 (ref 25–125)
Bilirubin, Total: 0.9

## 2021-10-18 LAB — COMPREHENSIVE METABOLIC PANEL
Albumin: 3.9 (ref 3.5–5.0)
Calcium: 9.1 (ref 8.7–10.7)

## 2021-10-18 NOTE — Progress Notes (Signed)
[facility]  [address] [place],  [place]  [address] [phone]  Clinic Day:10/18/21  Referring physician: [name], MD  ASSESSMENT & PLAN:   Stage IIB left breast cancer This is a grade 3 infiltrating ductal carcinoma with a T2 N0 M0 measuring 28 mm with 2 negative nodes.  She also has high-grade ductal carcinoma in situ.  Margins are clear.  Estrogen receptors positive at 30% and progesterone receptors are negative with HER2 negative and a Ki-67 of 60%.  I feel she is at higher risk for recurrence and discussed chemotherapy such as TC for 4 cycles.  However we have to take into consideration her advanced age of 70 and whether she would tolerate treatment.  Her EndoPredict score came back at 5.2, correlating with a 48% risk of distant recurrence in the next 10 years. She would have 24% benefit with chemotherapy. She decided to proceed with CMF chemotherapy and that was started 09/29/2021.    Osteopenia Bone density scan was done in September 2022 and reveals osteopenia of the hip with a T score of -2.0.  The spine readings were normal.  She was offered Fosamax but declined.  Large seroma/hematoma of the left breast This is stable  Positive family history for breast cancer She has 2 maternal aunts and a maternal cousin as well as a first paternal cousin all with breast cancer, primarily in their 11s and 53s.  I discussed the option of genetic testing but she seems not inclined to pursue this since she has no children of her own and is 87 years old, so it would not likely change her management.  Neutropenia Total white count is stable at 2.5 but her ANC has dropped from 1250 to 880. We will therefore have to postpone her chemotherapy for at least one week and will plan dose reduction.    Plan: She was scheduled for Thursday for her 2nd dose but we will postpone it because her white blood cells are decreasing. We will recheck her blood next week  and see her back then. We discussed dose reduction and possibly growth factor injections. She became anxious about whether to continue treatment at all. I discussed the assessment and treatment plan with the patient.  The patient was provided an opportunity to ask questions and all were answered.  The patient agreed with the plan and demonstrated an understanding of the instructions.  The patient was advised to call back if the symptoms worsen or if the condition fails to improve as anticipated.          [name], MD [facility] [address] [place] [address] Dept: [phone] Dept Fax: [phone]   Orders Placed This Encounter  Procedures   CBC and differential    This external order was created through the Results Console.   CBC    This external order was created through the Results Console.   Basic metabolic panel    This external order was created through the Results Console.   Comprehensive metabolic panel    This external order was created through the Results Console.   Hepatic function panel    This external order was created through the Results Console.      CHIEF COMPLAINT:  CC: Stage IIB hormone receptor positive breast cancer  Current Treatment: Adjuvant cyclophosphamide/methotrexate/5-fluorouracil every 3 weeks  HISTORY OF PRESENT ILLNESS:  Ashley Villa 81 y.o. female is here  because of recent diagnosis of left breast carcinoma. The cancer was detected by screening mammogram on March 6 which showed a possible mass. The cancer was not palpable prior to diagnosis.  She was sent for diagnostic mammogram on March 23 and this confirmed a persistent mass in the slightly upper left breast, middle depth.  An ultrasound confirmed a 1.6 cm irregular hypoechoic mass at 12:00, 2 cm from the nipple.  She then had a biopsy performed on March 29 which revealed invasive ductal carcinoma.  Estrogen  receptors were positive at 30% with progesterone receptors negative and HER2 negative although it did show 1+ on immuno histochemistry.  Ki 67 with 60%.  She was referred to Dr. Jerel Shepherd who performed a lumpectomy on May 10.  The final pathology revealed a grade 3 invasive ductal carcinoma with high-grade ductal carcinoma in situ.  Margins were clear and 2 sentinel nodes were negative.  The final measurement was 28 mm for a T2 N0 M0.  Her EndoPredict score came back at 5.2, correlating with a 48% risk of distant recurrence in the next 10 years. She would have 24% benefit with chemotherapy She does have yearly mammograms.   I reviewed her records extensively and collaborated the history with the patient. Oncology History  Breast cancer of upper-outer quadrant of left female breast ([place])  06/01/2021 Initial Diagnosis   Breast cancer of upper-outer quadrant of left female breast ([place])   07/13/2021 Cancer Staging   Staging form: Breast, AJCC 8th Edition - Clinical stage from 07/13/2021: Stage IIB (cT2, cN0(sn), cM0, G3, ER+, PR-, HER2-) - Signed by [name], MD on 08/10/2021 Histopathologic type: Infiltrating duct carcinoma, NOS Stage prefix: Initial diagnosis Method of lymph node assessment: Sentinel lymph node biopsy Nuclear grade: G3 Histologic grading system: 3 grade system Laterality: Left Tumor size (mm): 28 Lymph-vascular invasion (LVI): LVI not present (absent)/not identified Diagnostic confirmation: Positive histology Specimen type: Excision Staged by: Managing physician Percentage of positive estrogen receptors (%): 30 Percentage of positive progesterone receptors (%): 0 HER2-IHC interpretation: Negative HER2-IHC value: Score 1+ Menopausal status: Postmenopausal Ki-67 (%): 60 Stage used in treatment planning: Yes National guidelines used in treatment planning: Yes Type of national guideline used in treatment planning: NCCN   09/29/2021 - 10/27/2021 Chemotherapy    Patient is on Treatment Plan : BREAST Adjuvant CMF IV q21d     09/29/2021 -  Chemotherapy   Patient is on Treatment Plan : BREAST Adjuvant CMF IV q21d         INTERVAL HISTORY:  Averie is here today for a routine follow up and states she is feeling okay. She started chemotherapy 09/29/2021. She was scheduled Thursday for her 2nd dose but we will postpone it because her white blood cells are decreasing at 2.5 with an [place] of 880. CMP was normal. We will recheck her blood next week. She does have concern of continuing with the different treatments if her white blood cells do not go up, especially if she requires the Neulasta injections. She reports her blood pressure dropping only for that day at 117/60 but states she was active. Her appetite is well and weight remains stable. Wt Readings from Last 3 Encounters:  10/27/21 137 lb 0.6 oz (62.2 kg)  10/25/21 137 lb 3.2 oz (62.2 kg)  10/18/21 137 lb 11.2 oz (62.5 kg)  She denies any gastrointestinal or cardiorespiratory symptoms.  She denies fever, chills or other signs of infection.  She denies nausea, vomiting, bowel issues, or abdominal pain.  She denies sore throat, cough, dyspnea, or chest pain.  REVIEW OF SYSTEMS:  Review of Systems  Constitutional:  Negative for appetite change, chills, fatigue, fever and unexpected weight change.  HENT:   Negative for lump/mass, mouth sores and sore throat.   Respiratory:  Negative for cough and shortness of breath.   Cardiovascular:  Negative for chest pain and leg swelling.  Gastrointestinal:  Negative for abdominal pain, constipation, diarrhea, nausea and vomiting.  Endocrine: Negative for hot flashes.  Genitourinary:  Negative for difficulty urinating, dysuria, frequency and hematuria.   Musculoskeletal:  Negative for arthralgias, back pain and myalgias.  Skin:  Negative for rash.  Neurological:  Negative for dizziness and headaches.  Hematological:  Negative for adenopathy. Does not bruise/bleed  easily.  Psychiatric/Behavioral:  Negative for depression and sleep disturbance. The patient is not nervous/anxious.      VITALS:  Blood pressure (!) 148/78, pulse (!) 56, temperature (!) 97.5 F (36.4 C), temperature source Oral, resp. rate 18, height 5' 2.4" (1.585 m), weight 137 lb 11.2 oz (62.5 kg), SpO2 95 %.  Wt Readings from Last 3 Encounters:  10/27/21 137 lb 0.6 oz (62.2 kg)  10/25/21 137 lb 3.2 oz (62.2 kg)  10/18/21 137 lb 11.2 oz (62.5 kg)    Body mass index is 24.86 kg/m.  Performance status (ECOG): 0 - Asymptomatic  PHYSICAL EXAM:  Physical Exam Vitals and nursing note reviewed.  Constitutional:      General: She is not in acute distress.    Appearance: Normal appearance.  HENT:     Head: Normocephalic and atraumatic.     Mouth/Throat:     Mouth: Mucous membranes are moist.     Pharynx: Oropharynx is clear. No oropharyngeal exudate or posterior oropharyngeal erythema.  Eyes:     General: No scleral icterus.    Extraocular Movements: Extraocular movements intact.     Conjunctiva/sclera: Conjunctivae normal.     Pupils: Pupils are equal, round, and reactive to light.  Cardiovascular:     Rate and Rhythm: Normal rate and regular rhythm.     Pulses: Normal pulses.     Heart sounds: Normal heart sounds. No murmur heard.    No friction rub. No gallop.  Pulmonary:     Effort: Pulmonary effort is normal.     Breath sounds: Normal breath sounds. No wheezing, rhonchi or rales.  Chest:     Comments: Persistent seroma measuring 4-5 centimeters in diameter.  Abdominal:     General: There is no distension.     Palpations: Abdomen is soft. There is no hepatomegaly, splenomegaly or mass.     Tenderness: There is no abdominal tenderness.  Musculoskeletal:        General: Normal range of motion.     Cervical back: Normal range of motion and neck supple. No tenderness.     Right lower leg: No edema.     Left lower leg: No edema.  Lymphadenopathy:     Cervical: No  cervical adenopathy.     Upper Body:     Right upper body: No supraclavicular or axillary adenopathy.     Left upper body: No supraclavicular or axillary adenopathy.     Lower Body: No right inguinal adenopathy. No left inguinal adenopathy.  Skin:    General: Skin is warm and dry.     Coloration: Skin is not jaundiced.     Findings: No rash.  Neurological:     Mental Status: She is alert and oriented to person,  place, and time.     Cranial Nerves: No cranial nerve deficit.  Psychiatric:        Mood and Affect: Mood normal.        Behavior: Behavior normal.        Thought Content: Thought content normal.    LABS:      Latest Ref Rng & Units 10/25/2021   12:00 AM 10/18/2021   12:00 AM 10/10/2021   12:00 AM  CBC  WBC  5.2     2.5     2.6      Hemoglobin 12.0 - 16.0 13.8     13.1     12.9      Hematocrit 36 - 46 41     40     40      Platelets 150 - 400 K/uL 123     122     125         This result is from an external source.      Latest Ref Rng & Units 10/25/2021   12:00 AM 10/18/2021   12:00 AM 10/10/2021   12:00 AM  CMP  BUN 4 - _0 Creatinine 0.5 - 1.1 0.8     0.9     0.8      Sodium 137 - 147 141     138     137      Potassium 3.5 - 5.1 mEq/L 4.0     4.0     3.9      Chloride 99 - 108 105     104     102      CO2 13 - 22 33     31     30      Calcium 8.7 - 10.7 9.4     9.1     9.3      Alkaline Phos 25 - 125 87     87     80      AST 13 - 35 47     33     31      ALT 7 - 35 U/L 55     24     21         This result is from an external source.     No results found for: "CEA1", "CEA" / No results found for: "CEA1", "CEA" No results found for: "PSA1" No results found for: "XJO832" No results found for: "CAN125"  No results found for: "TOTALPROTELP", "ALBUMINELP", "A1GS", "A2GS", "BETS", "BETA2SER", "GAMS", "MSPIKE", "SPEI" No results found for: "TIBC", "FERRITIN", "IRONPCTSAT" No results found for: "LDH"  STUDIES:  No results found.   HISTORY:    Past Medical History:  Diagnosis Date   H/O mitral valve repair    HTN (hypertension)    Status post left breast lumpectomy     Past Surgical History:  Procedure Laterality Date   TONSILLECTOMY      Family History  Problem Relation Age of Onset   Cancer Mother    Cancer Maternal Aunt     Social History:  reports that she has never smoked. She has never used smokeless tobacco. She reports that she does not drink alcohol and does not use drugs.The patient is accompanied by her husband today.  Allergies: No Known Allergies  Current Medications: Current Outpatient Medications  Medication Sig Dispense Refill  prochlorperazine (COMPAZINE) 10 MG tablet Take 1 tablet (10 mg total) by mouth every 6 (six) hours as needed for nausea or vomiting. 30 tablet 1   amoxicillin (AMOXIL) 500 MG capsule Prn prior to dentist visit with hx of mitral valve prolapse     Calcium Citrate-Vitamin D 315-5 MG-MCG TABS Take 1 tablet by mouth daily.     Cholecalciferol 25 MCG (1000 UT) capsule Take by mouth.     Coenzyme Q10 50 MG CAPS Take by mouth.     docusate sodium (COLACE) 100 MG capsule Take 100 mg by mouth 2 (two) times daily.     glucosamine-chondroitin 500-400 MG tablet Take 1 tablet by mouth daily.     levocetirizine (XYZAL) 5 MG tablet Take 5 mg by mouth daily.     losartan (COZAAR) 100 MG tablet Take 100 mg by mouth daily.     metoprolol tartrate (LOPRESSOR) 25 MG tablet Take by mouth.     multivitamin-lutein (OCUVITE-LUTEIN) CAPS capsule Take 1 capsule by mouth daily.     ondansetron (ZOFRAN) 8 MG tablet Take 1 tablet (8 mg total) by mouth 2 (two) times daily as needed for refractory nausea / vomiting (Start on day 3 after chemotherapy). 30 tablet 1   simvastatin (ZOCOR) 20 MG tablet Take 20 mg by mouth at bedtime.     No current facility-administered medications for this visit.         I,Gabriella Ballesteros,acting as a scribe for [name], MD.,have documented all  relevant documentation on the behalf of [name], MD,as directed by  [name], MD while in the presence of [name], MD.

## 2021-10-18 NOTE — Progress Notes (Signed)
Face to face contact with pt and her husband in lobby of [facility] today. Pt has completed one round of chemo and is here for follow up prior to her next treatment. Pt reports that she did pretty well with the treatment. She did experience some nausea. Encouraged pt to call with questions or concerns.

## 2021-10-19 ENCOUNTER — Other Ambulatory Visit: Payer: Self-pay

## 2021-10-20 ENCOUNTER — Ambulatory Visit: Payer: Medicare HMO

## 2021-10-20 NOTE — Progress Notes (Signed)
Ashley Villa  [address] [place],  [place]  [address] [phone]  Clinic Day:  10/25/2021  Referring physician: [name], MD  ASSESSMENT & PLAN:   Stage IIB left breast cancer This is a grade 3 infiltrating ductal carcinoma with a T2 N0 M0 measuring 28 mm with 2 negative nodes.  She also has high-grade ductal carcinoma in situ.  Margins are clear.  Estrogen receptors positive at 30% and progesterone receptors are negative with HER2 negative and a Ki-67 of 60%.  I feel she is at higher risk for recurrence and discussed chemotherapy such as TC for 4 cycles.  However we have to take into consideration her advanced age of 81 and whether she would tolerate treatment.  Her EndoPredict score came back at 5.2, correlating with a 48% risk of distant recurrence in the next 10 years. She would have 24% benefit with chemotherapy. She decided to proceed with CMF chemotherapy and that was started 09/29/2021.    Osteopenia Bone density scan was done in September 2022 and reveals osteopenia of the hip with a T score of -2.0.  The spine readings were normal.  She was offered Fosamax but declined.  Large seroma/hematoma of the left breast This is stable  Positive family history for breast cancer She has 2 maternal aunts and a maternal cousin as well as a first paternal cousin all with breast cancer, primarily in their 64s and 47s.  I discussed the option of genetic testing but she seems not inclined to pursue this since she has no children of her own and is 75 years old, so it would not likely change her management.  Neutropenia Total white count is stable at 2.5 but her ANC has dropped from 1250 to 880. Her dose will be reduced by 20% for this next cycle.   Plan: We discussed dose reduction by 20%. She will continue to receive her 2nd dose for chemotherapy and we will see her back in 3 weeks with CBC and CMP. I discussed the assessment and treatment plan with  the patient.  The patient was provided an opportunity to ask questions and all were answered.  The patient agreed with the plan and demonstrated an understanding of the instructions.  The patient was advised to call back if the symptoms worsen or if the condition fails to improve as anticipated.          [name], MD [facility] [address] [place] [address] Dept: [phone] Dept Fax: [phone]   No orders of the defined types were placed in this encounter.     CHIEF COMPLAINT:  CC: Stage IIB hormone receptor positive breast cancer  Current Treatment: Adjuvant cyclophosphamide/methotrexate/5-fluorouracil every 3 weeks  HISTORY OF PRESENT ILLNESS:  Ashley Villa 81 y.o. female is here because of recent diagnosis of left breast carcinoma. The cancer was detected by screening mammogram on March 6 which showed a possible mass. The cancer was not palpable prior to diagnosis.  She was sent for diagnostic mammogram on March 23 and this confirmed a persistent mass in the slightly upper left breast, middle depth.  An ultrasound confirmed a 1.6 cm irregular hypoechoic mass at 12:00, 2 cm from the nipple.  She then had a biopsy performed on March 29 which revealed invasive ductal carcinoma.  Estrogen receptors were positive at 30% with progesterone receptors negative and HER2 negative although it did show 1+ on immuno  histochemistry.  Ki 67 with 60%.  She was referred to Dr. Jerel Shepherd who performed a lumpectomy on May 10.  The final pathology revealed a grade 3 invasive ductal carcinoma with high-grade ductal carcinoma in situ.  Margins were clear and 2 sentinel nodes were negative.  The final measurement was 28 mm for a T2 N0 M0.  She does have yearly mammograms.   I reviewed her records extensively and collaborated the history with the patient. Oncology History  Breast cancer of upper-outer quadrant  of left female breast ([facility])  06/01/2021 Initial Diagnosis   Breast cancer of upper-outer quadrant of left female breast ([facility])   07/13/2021 Cancer Staging   Staging form: Breast, AJCC 8th Edition - Clinical stage from 07/13/2021: Stage IIB (cT2, cN0(sn), cM0, G3, ER+, PR-, HER2-) - Signed by [name], MD on 08/10/2021 Histopathologic type: Infiltrating duct carcinoma, NOS Stage prefix: Initial diagnosis Method of lymph node assessment: Sentinel lymph node biopsy Nuclear grade: G3 Histologic grading system: 3 grade system Laterality: Left Tumor size (mm): 28 Lymph-vascular invasion (LVI): LVI not present (absent)/not identified Diagnostic confirmation: Positive histology Specimen type: Excision Staged by: Managing physician Percentage of positive estrogen receptors (%): 30 Percentage of positive progesterone receptors (%): 0 HER2-IHC interpretation: Negative HER2-IHC value: Score 1+ Menopausal status: Postmenopausal Ki-67 (%): 60 Stage used in treatment planning: Yes National guidelines used in treatment planning: Yes Type of national guideline used in treatment planning: NCCN   09/29/2021 -  Chemotherapy   Patient is on Treatment Plan : BREAST Adjuvant CMF IV q21d         INTERVAL HISTORY:  Ashley [place] is here today for a routine follow up and states she is feeling okay. Blood counts are normal. Last week her white count was down to 2.5 and ANC 880, today her white count is 5.2.So we had to postpone her dose and I now realize that we had not reduced the dose by 20% as planned, so we will do so now, and she should do well.  Platelets are slightly low at 123. She started chemotherapy 09/29/2021. She will continue with her 2nd dose of chemotherapy this Thursday.  Blood pressure is elevated but is due to her being anxious about lab results. Her appetite is well and weight remains stable. Wt Readings from Last 3 Encounters:  10/25/21 137 lb 3.2 oz (62.2 kg)  10/18/21 137 lb 11.2 oz  (62.5 kg)  10/10/21 137 lb 4.8 oz (62.3 kg)  She denies any gastrointestinal or cardiorespiratory symptoms.  She denies fever, chills or other signs of infection.  She denies nausea, vomiting, bowel issues, or abdominal pain.  She denies sore throat, cough, dyspnea, or chest pain.  REVIEW OF SYSTEMS:  Review of Systems  Constitutional:  Negative for appetite change, chills, fatigue, fever and unexpected weight change.  HENT:   Negative for lump/mass, mouth sores and sore throat.   Respiratory:  Negative for cough and shortness of breath.   Cardiovascular:  Negative for chest pain and leg swelling.  Gastrointestinal:  Negative for abdominal pain, constipation, diarrhea, nausea and vomiting.  Endocrine: Negative for hot flashes.  Genitourinary:  Negative for difficulty urinating, dysuria, frequency and hematuria.   Musculoskeletal:  Negative for arthralgias, back pain and myalgias.  Skin:  Negative for rash.  Neurological:  Negative for dizziness and headaches.  Hematological:  Negative for adenopathy. Does not bruise/bleed easily.  Psychiatric/Behavioral:  Negative for depression and sleep disturbance. The patient is not nervous/anxious.  VITALS:  There were no vitals taken for this visit.  Wt Readings from Last 3 Encounters:  10/18/21 137 lb 11.2 oz (62.5 kg)  10/10/21 137 lb 4.8 oz (62.3 kg)  09/29/21 137 lb 1.3 oz (62.2 kg)    There is no height or weight on file to calculate BMI.  Performance status (ECOG): 0 - Asymptomatic  PHYSICAL EXAM:  Physical Exam Vitals and nursing note reviewed.  Constitutional:      General: She is not in acute distress.    Appearance: Normal appearance. She is not ill-appearing.  HENT:     Head: Normocephalic and atraumatic.     Mouth/Throat:     Mouth: Mucous membranes are moist.     Pharynx: Oropharynx is clear. No oropharyngeal exudate or posterior oropharyngeal erythema.  Eyes:     General: No scleral icterus.    Extraocular  Movements: Extraocular movements intact.     Conjunctiva/sclera: Conjunctivae normal.     Pupils: Pupils are equal, round, and reactive to light.  Cardiovascular:     Rate and Rhythm: Normal rate and regular rhythm.     Pulses: Normal pulses.     Heart sounds: Normal heart sounds. No murmur heard.    No friction rub. No gallop.  Pulmonary:     Effort: Pulmonary effort is normal.     Breath sounds: Normal breath sounds. No wheezing, rhonchi or rales.  Chest:     Comments: Persistent seroma measuring 4-5 centimeters in diameter.  Abdominal:     General: There is no distension.     Palpations: Abdomen is soft. There is no hepatomegaly, splenomegaly or mass.     Tenderness: There is no abdominal tenderness.  Musculoskeletal:        General: Normal range of motion.     Cervical back: Normal range of motion and neck supple. No tenderness.     Right lower leg: No edema.     Left lower leg: No edema.  Lymphadenopathy:     Cervical: No cervical adenopathy.     Upper Body:     Right upper body: No supraclavicular or axillary adenopathy.     Left upper body: No supraclavicular or axillary adenopathy.     Lower Body: No right inguinal adenopathy. No left inguinal adenopathy.  Skin:    General: Skin is warm and dry.     Coloration: Skin is not jaundiced.     Findings: No rash.  Neurological:     Mental Status: She is alert and oriented to person, place, and time.     Cranial Nerves: No cranial nerve deficit.  Psychiatric:        Mood and Affect: Mood normal.        Behavior: Behavior normal.        Thought Content: Thought content normal.    LABS:      Latest Ref Rng & Units 10/18/2021   12:00 AM 10/10/2021   12:00 AM 08/11/2021   12:00 AM  CBC  WBC  2.5     2.6     5.7      Hemoglobin 12.0 - 16.0 13.1     12.9     13.3      Hematocrit 36 - 46 40     40     42      Platelets 150 - 400 K/uL 122     125     152         This result is  from an external source.       Latest Ref Rng  & Units 10/18/2021   12:00 AM 10/10/2021   12:00 AM 08/11/2021   12:00 AM  CMP  BUN 4 - _0 Creatinine 0.5 - 1.1 0.9     0.8     0.8      Sodium 137 - 147 138     137     141      Potassium 3.5 - 5.1 mEq/L 4.0     3.9     3.9      Chloride 99 - 108 104     102     103      CO2 13 - _1 32      Calcium 8.7 - 10.7 9.1     9.3     9.0      Alkaline Phos 25 - 125 87     80     89      AST 13 - 35 33     31     39      ALT 7 - 35 U/L _2 This result is from an external source.      No results found for: "CEA1", "CEA" / No results found for: "CEA1", "CEA" No results found for: "PSA1" No results found for: "FMB846" No results found for: "CAN125"  No results found for: "TOTALPROTELP", "ALBUMINELP", "A1GS", "A2GS", "BETS", "BETA2SER", "GAMS", "MSPIKE", "SPEI" No results found for: "TIBC", "FERRITIN", "IRONPCTSAT" No results found for: "LDH"  STUDIES:  No results found.  EXAM: 05/26/21 DIGITAL DIAGNOSTIC UNILATERAL LEFT MAMMOGRAM WITH TOMOSYNTHESIS AND  CAD; ULTRASOUND LEFT BREAST  TECHNIQUE:  Left digital diagnostic mammography and breast tomosynthesis was  performed. The images were evaluated with computer-aided detection.;  Targeted ultrasound examination of the left breast was performed.  COMPARISON: Previous exam(s).  ACR Breast Density Category b: There are scattered areas of  fibroglandular density.  FINDINGS:  2D/3D spot compression views of the LEFT breast demonstrate a  persistent mass with distortion in the slightly UPPER LEFT breast,  middle depth.  Targeted ultrasound is performed, showing a 1 x 0.7 x 1.6 cm  irregular hypoechoic mass at the 12 o'clock position of the LEFT  breast 2 cm from the nipple.  No abnormal LEFT axillary lymph nodes are noted.  IMPRESSION:  1. Highly suspicious 1.6 cm UPPER LEFT breast mass. Tissue sampling  is recommended.  2. No abnormal appearing LEFT axillary lymph nodes.   RECOMMENDATION:  Ultrasound-guided LEFT breast biopsy, which will be scheduled.  I have discussed the findings and recommendations with the patient.  If applicable, a reminder letter will be sent to the patient  regarding the next appointment.  BI-RADS CATEGORY 5: Highly suggestive of malignancy.    Electronically Signed  By: Margarette Canada M.D.  On: 05/26/2021 15:43   HISTORY:   Past Medical History:  Diagnosis Date   H/O mitral valve repair    HTN (hypertension)    Status post left breast lumpectomy     Past Surgical History:  Procedure Laterality Date   TONSILLECTOMY      Family History  Problem Relation Age of Onset   Cancer Mother    Cancer Maternal Aunt  Social History:  reports that she has never smoked. She has never used smokeless tobacco. She reports that she does not drink alcohol and does not use drugs.The patient is accompanied by her husband today.  Allergies: No Known Allergies  Current Medications: Current Outpatient Medications  Medication Sig Dispense Refill   amoxicillin (AMOXIL) 500 MG capsule Prn prior to dentist visit with hx of mitral valve prolapse     Calcium Citrate-Vitamin D 315-5 MG-MCG TABS Take 1 tablet by mouth daily.     Cholecalciferol 25 MCG (1000 UT) capsule Take by mouth.     Coenzyme Q10 50 MG CAPS Take by mouth.     glucosamine-chondroitin 500-400 MG tablet Take 1 tablet by mouth daily.     levocetirizine (XYZAL) 5 MG tablet Take 5 mg by mouth daily.     losartan (COZAAR) 100 MG tablet Take 100 mg by mouth daily.     metoprolol tartrate (LOPRESSOR) 25 MG tablet Take by mouth.     multivitamin-lutein (OCUVITE-LUTEIN) CAPS capsule Take 1 capsule by mouth daily.     ondansetron (ZOFRAN) 8 MG tablet Take 1 tablet (8 mg total) by mouth 2 (two) times daily as needed for refractory nausea / vomiting (Start on day 3 after chemotherapy). 30 tablet 1   prochlorperazine (COMPAZINE) 10 MG tablet Take 1 tablet (10 mg total) by mouth every 6  (six) hours as needed for nausea or vomiting. 30 tablet 1   simvastatin (ZOCOR) 20 MG tablet Take 20 mg by mouth at bedtime.     No current facility-administered medications for this visit.         I,Gabriella Ballesteros,acting as a scribe for [name], MD.,have documented all relevant documentation on the behalf of [name], MD,as directed by  [name], MD while in the presence of [name], MD.

## 2021-10-23 NOTE — Progress Notes (Signed)
[place]  [address] [place],  [place]  [address] [phone]  Clinic Day:  10/25/21  Referring physician: [name], MD  ASSESSMENT & PLAN:   Stage IIB left breast cancer This is a grade 3 infiltrating ductal carcinoma with a T2 N0 M0 measuring 28 mm with 2 negative nodes.  She also has high-grade ductal carcinoma in situ.  Margins are clear.  Estrogen receptors positive at 30% and progesterone receptors are negative with HER2 negative and a Ki-67 of 60%.  I feel she is at higher risk for recurrence and discussed chemotherapy such as TC for 4 cycles.  However we have to take into consideration her advanced age of 80 and whether she would tolerate treatment.  Her EndoPredict score came back at 5.2, correlating with a 48% risk of distant recurrence in the next 10 years. She would have 24% benefit with chemotherapy. She decided to proceed with CMF chemotherapy and that was started 09/29/2021.    Osteopenia Bone density scan was done in September 2022 and reveals osteopenia of the hip with a T score of -2.0.  The spine readings were normal.  She was offered Fosamax but declined.  Large seroma/hematoma of the left breast This is stable  Positive family history for breast cancer She has 2 maternal aunts and a maternal cousin as well as a first paternal cousin all with breast cancer, primarily in their 50s and 53s.  I discussed the option of genetic testing but she seems not inclined to pursue this since she has no children of her own and is 27 years old, so it would not likely change her management.  Neutropenia Total white count is normal at 5.2 with an ANC of 2.8. We can proceed with her treatment but will plan a 20% dose reduction.    Plan: She was scheduled for last week for her 2nd dose but we postponed it because of neutropenia. This is now back to normal and we will proceed with her treatment but with a 20% dose reduction, since she had  been given full dose and she is 81 years old. We will not plan growth factor injections. She became anxious about whether to continue treatment at all, but is now in agreement. I discussed the assessment and treatment plan with the patient.  The patient was provided an opportunity to ask questions and all were answered.  The patient agreed with the plan and demonstrated an understanding of the instructions.  The patient was advised to call back if the symptoms worsen or if the condition fails to improve as anticipated.          [name], MD [facility] [address] [place] [address] Dept: [phone] Dept Fax: [phone]   Orders Placed This Encounter  Procedures   CBC and differential    This external order was created through the Results Console.   CBC    This external order was created through the Results Console.   Basic metabolic panel    This external order was created through the Results Console.   Comprehensive metabolic panel    This external order was created through the Results Console.   Hepatic function panel    This external order was created through the Results Console.      CHIEF COMPLAINT:  CC: Stage IIB hormone receptor positive breast cancer  Current Treatment: Adjuvant cyclophosphamide/methotrexate/5-fluorouracil every 3 weeks  HISTORY  OF PRESENT ILLNESS:  Ashley Villa 81 y.o. female is here because of recent diagnosis of left breast carcinoma. The cancer was detected by screening mammogram on March 6 which showed a possible mass. The cancer was not palpable prior to diagnosis.  She was sent for diagnostic mammogram on March 23 and this confirmed a persistent mass in the slightly upper left breast, middle depth.  An ultrasound confirmed a 1.6 cm irregular hypoechoic mass at 12:00, 2 cm from the nipple.  She then had a biopsy performed on March 29 which revealed invasive  ductal carcinoma.  Estrogen receptors were positive at 30% with progesterone receptors negative and HER2 negative although it did show 1+ on immuno histochemistry.  Ki 67 with 60%.  She was referred to Dr. Jerel Shepherd who performed a lumpectomy on May 10.  The final pathology revealed a grade 3 invasive ductal carcinoma with high-grade ductal carcinoma in situ.  Margins were clear and 2 sentinel nodes were negative.  The final measurement was 28 mm for a T2 N0 M0.  She does have yearly mammograms.   I reviewed her records extensively and collaborated the history with the patient. Oncology History  Breast cancer of upper-outer quadrant of left female breast ([place])  06/01/2021 Initial Diagnosis   Breast cancer of upper-outer quadrant of left female breast ([place])   07/13/2021 Cancer Staging   Staging form: Breast, AJCC 8th Edition - Clinical stage from 07/13/2021: Stage IIB (cT2, cN0(sn), cM0, G3, ER+, PR-, HER2-) - Signed by [name], MD on 08/10/2021 Histopathologic type: Infiltrating duct carcinoma, NOS Stage prefix: Initial diagnosis Method of lymph node assessment: Sentinel lymph node biopsy Nuclear grade: G3 Histologic grading system: 3 grade system Laterality: Left Tumor size (mm): 28 Lymph-vascular invasion (LVI): LVI not present (absent)/not identified Diagnostic confirmation: Positive histology Specimen type: Excision Staged by: Managing physician Percentage of positive estrogen receptors (%): 30 Percentage of positive progesterone receptors (%): 0 HER2-IHC interpretation: Negative HER2-IHC value: Score 1+ Menopausal status: Postmenopausal Ki-67 (%): 60 Stage used in treatment planning: Yes National guidelines used in treatment planning: Yes Type of national guideline used in treatment planning: NCCN   09/29/2021 - 10/27/2021 Chemotherapy   Patient is on Treatment Plan : BREAST Adjuvant CMF IV q21d     09/29/2021 -  Chemotherapy   Patient is on Treatment Plan :  BREAST Adjuvant CMF IV q21d         INTERVAL HISTORY:  Ashley Villa is here today for a routine follow up and states she is feeling okay. She started chemotherapy 09/29/2021. She was scheduled last Thursday for her 2nd dose but we postponed it because her white blood cells were decreasing at 2.5 with an [place] of 880. CMP was normal. Her CBC is back to normal this week. She does have concern of continuing with the different treatments if her white blood cells do not go up, especially if she requires the Neulasta injections. Her appetite is well and weight remains stable.  She denies any gastrointestinal or cardiorespiratory symptoms.  She denies fever, chills or other signs of infection.  She denies nausea, vomiting, bowel issues, or abdominal pain.  She denies sore throat, cough, dyspnea, or chest pain.  REVIEW OF SYSTEMS:  Review of Systems  Constitutional:  Negative for appetite change, chills, fatigue, fever and unexpected weight change.  HENT:   Negative for lump/mass, mouth sores and sore throat.   Respiratory:  Negative for cough and shortness of breath.   Cardiovascular:  Negative for  chest pain and leg swelling.  Gastrointestinal:  Negative for abdominal pain, constipation, diarrhea, nausea and vomiting.  Endocrine: Negative for hot flashes.  Genitourinary:  Negative for difficulty urinating, dysuria, frequency and hematuria.   Musculoskeletal:  Negative for arthralgias, back pain and myalgias.  Skin:  Negative for rash.  Neurological:  Negative for dizziness and headaches.  Hematological:  Negative for adenopathy. Does not bruise/bleed easily.  Psychiatric/Behavioral:  Negative for depression and sleep disturbance. The patient is not nervous/anxious.      VITALS:  Blood pressure (!) 189/88, pulse (!) 55, temperature 97.6 F (36.4 C), temperature source Oral, resp. rate 17, height 5' 2.4" (1.585 m), weight 137 lb 3.2 oz (62.2 kg), SpO2 94 %.  Wt Readings from Last 3 Encounters:  10/27/21  137 lb 0.6 oz (62.2 kg)  10/25/21 137 lb 3.2 oz (62.2 kg)  10/18/21 137 lb 11.2 oz (62.5 kg)    Body mass index is 24.77 kg/m.  Performance status (ECOG): 0 - Asymptomatic  PHYSICAL EXAM:  Physical Exam Vitals and nursing note reviewed.  Constitutional:      General: She is not in acute distress.    Appearance: Normal appearance.  HENT:     Head: Normocephalic and atraumatic.     Mouth/Throat:     Mouth: Mucous membranes are moist.     Pharynx: Oropharynx is clear. No oropharyngeal exudate or posterior oropharyngeal erythema.  Eyes:     General: No scleral icterus.    Extraocular Movements: Extraocular movements intact.     Conjunctiva/sclera: Conjunctivae normal.     Pupils: Pupils are equal, round, and reactive to light.  Cardiovascular:     Rate and Rhythm: Normal rate and regular rhythm.     Pulses: Normal pulses.     Heart sounds: Normal heart sounds. No murmur heard.    No friction rub. No gallop.  Pulmonary:     Effort: Pulmonary effort is normal.     Breath sounds: Normal breath sounds. No wheezing, rhonchi or rales.  Chest:     Comments: Persistent seroma measuring 4-5 centimeters in diameter.  Abdominal:     General: There is no distension.     Palpations: Abdomen is soft. There is no hepatomegaly, splenomegaly or mass.     Tenderness: There is no abdominal tenderness.  Musculoskeletal:        General: Normal range of motion.     Cervical back: Normal range of motion and neck supple. No tenderness.     Right lower leg: No edema.     Left lower leg: No edema.  Lymphadenopathy:     Cervical: No cervical adenopathy.     Upper Body:     Right upper body: No supraclavicular or axillary adenopathy.     Left upper body: No supraclavicular or axillary adenopathy.     Lower Body: No right inguinal adenopathy. No left inguinal adenopathy.  Skin:    General: Skin is warm and dry.     Coloration: Skin is not jaundiced.     Findings: No rash.  Neurological:      Mental Status: She is alert and oriented to person, place, and time.     Cranial Nerves: No cranial nerve deficit.  Psychiatric:        Mood and Affect: Mood normal.        Behavior: Behavior normal.        Thought Content: Thought content normal.    LABS:      Latest Ref Rng &  Units 10/25/2021   12:00 AM 10/18/2021   12:00 AM 10/10/2021   12:00 AM  CBC  WBC  5.2     2.5     2.6      Hemoglobin 12.0 - 16.0 13.8     13.1     12.9      Hematocrit 36 - 46 41     40     40      Platelets 150 - 400 K/uL 123     122     125         This result is from an external source.      Latest Ref Rng & Units 10/25/2021   12:00 AM 10/18/2021   12:00 AM 10/10/2021   12:00 AM  CMP  BUN 4 - '21 19     17     20      ' Creatinine 0.5 - 1.1 0.8     0.9     0.8      Sodium 137 - 147 141     138     137      Potassium 3.5 - 5.1 mEq/L 4.0     4.0     3.9      Chloride 99 - 108 105     104     102      CO2 13 - 22 33     31     30      Calcium 8.7 - 10.7 9.4     9.1     9.3      Alkaline Phos 25 - 125 87     87     80      AST 13 - 35 47     33     31      ALT 7 - 35 U/L 55     24     21         This result is from an external source.     No results found for: "CEA1", "CEA" / No results found for: "CEA1", "CEA" No results found for: "PSA1" No results found for: "DDU202" No results found for: "CAN125"  No results found for: "TOTALPROTELP", "ALBUMINELP", "A1GS", "A2GS", "BETS", "BETA2SER", "GAMS", "MSPIKE", "SPEI" No results found for: "TIBC", "FERRITIN", "IRONPCTSAT" No results found for: "LDH"  STUDIES:  No results found.   HISTORY:   Past Medical History:  Diagnosis Date   H/O mitral valve repair    HTN (hypertension)    Status post left breast lumpectomy     Past Surgical History:  Procedure Laterality Date   TONSILLECTOMY      Family History  Problem Relation Age of Onset   Cancer Mother    Cancer Maternal Aunt     Social History:  reports that she has never smoked. She has  never used smokeless tobacco. She reports that she does not drink alcohol and does not use drugs.The patient is accompanied by her husband today.  Allergies: No Known Allergies  Current Medications: Current Outpatient Medications  Medication Sig Dispense Refill   amoxicillin (AMOXIL) 500 MG capsule Prn prior to dentist visit with hx of mitral valve prolapse     Calcium Citrate-Vitamin D 315-5 MG-MCG TABS Take 1 tablet by mouth daily.     Cholecalciferol 25 MCG (1000 UT) capsule Take by mouth.     Coenzyme Q10 50 MG CAPS Take by mouth.     docusate  sodium (COLACE) 100 MG capsule Take 100 mg by mouth 2 (two) times daily.     glucosamine-chondroitin 500-400 MG tablet Take 1 tablet by mouth daily.     levocetirizine (XYZAL) 5 MG tablet Take 5 mg by mouth daily.     losartan (COZAAR) 100 MG tablet Take 100 mg by mouth daily.     metoprolol tartrate (LOPRESSOR) 25 MG tablet Take by mouth.     multivitamin-lutein (OCUVITE-LUTEIN) CAPS capsule Take 1 capsule by mouth daily.     ondansetron (ZOFRAN) 8 MG tablet Take 1 tablet (8 mg total) by mouth 2 (two) times daily as needed for refractory nausea / vomiting (Start on day 3 after chemotherapy). 30 tablet 1   prochlorperazine (COMPAZINE) 10 MG tablet Take 1 tablet (10 mg total) by mouth every 6 (six) hours as needed for nausea or vomiting. 30 tablet 1   simvastatin (ZOCOR) 20 MG tablet Take 20 mg by mouth at bedtime.     No current facility-administered medications for this visit.         I,Gabriella Ballesteros,acting as a scribe for [name], MD.,have documented all relevant documentation on the behalf of [name], MD,as directed by  [name], MD while in the presence of [name], MD.

## 2021-10-25 ENCOUNTER — Encounter: Payer: Self-pay | Admitting: Oncology

## 2021-10-25 ENCOUNTER — Inpatient Hospital Stay (INDEPENDENT_AMBULATORY_CARE_PROVIDER_SITE_OTHER): Payer: Medicare HMO | Admitting: Oncology

## 2021-10-25 ENCOUNTER — Inpatient Hospital Stay: Payer: Medicare HMO

## 2021-10-25 VITALS — BP 189/88 | HR 55 | Temp 97.6°F | Resp 17 | Ht 62.4 in | Wt 137.2 lb

## 2021-10-25 DIAGNOSIS — Z170 Estrogen receptor positive status [ER+]: Secondary | ICD-10-CM

## 2021-10-25 DIAGNOSIS — M8580 Other specified disorders of bone density and structure, unspecified site: Secondary | ICD-10-CM | POA: Diagnosis not present

## 2021-10-25 DIAGNOSIS — C50412 Malignant neoplasm of upper-outer quadrant of left female breast: Secondary | ICD-10-CM | POA: Diagnosis not present

## 2021-10-25 DIAGNOSIS — Z780 Asymptomatic menopausal state: Secondary | ICD-10-CM | POA: Diagnosis not present

## 2021-10-25 DIAGNOSIS — D649 Anemia, unspecified: Secondary | ICD-10-CM | POA: Diagnosis not present

## 2021-10-25 LAB — BASIC METABOLIC PANEL
BUN: 19 (ref 4–21)
CO2: 33 — AB (ref 13–22)
Chloride: 105 (ref 99–108)
Creatinine: 0.8 (ref 0.5–1.1)
Glucose: 93
Potassium: 4 mEq/L (ref 3.5–5.1)
Sodium: 141 (ref 137–147)

## 2021-10-25 LAB — CBC AND DIFFERENTIAL
HCT: 41 (ref 36–46)
Hemoglobin: 13.8 (ref 12.0–16.0)
Neutrophils Absolute: 2.81
Platelets: 123 10*3/uL — AB (ref 150–400)
WBC: 5.2

## 2021-10-25 LAB — HEPATIC FUNCTION PANEL
ALT: 55 U/L — AB (ref 7–35)
AST: 47 — AB (ref 13–35)
Alkaline Phosphatase: 87 (ref 25–125)
Bilirubin, Total: 1

## 2021-10-25 LAB — COMPREHENSIVE METABOLIC PANEL
Albumin: 4 (ref 3.5–5.0)
Calcium: 9.4 (ref 8.7–10.7)

## 2021-10-25 LAB — CBC: RBC: 4.51 (ref 3.87–5.11)

## 2021-10-26 ENCOUNTER — Other Ambulatory Visit: Payer: Self-pay

## 2021-10-26 MED FILL — Dexamethasone Sodium Phosphate Inj 100 MG/10ML: INTRAMUSCULAR | Qty: 1 | Status: AC

## 2021-10-26 MED FILL — Fluorouracil IV Soln 2.5 GM/50ML (50 MG/ML): INTRAVENOUS | Qty: 13 | Status: AC

## 2021-10-26 MED FILL — Cyclophosphamide For Inj 1 GM: INTRAMUSCULAR | Qty: 34 | Status: AC

## 2021-10-26 MED FILL — Methotrexate Sodium Inj PF 250 MG/10ML (25 MG/ML): INTRAMUSCULAR | Qty: 1.6 | Status: AC

## 2021-10-27 ENCOUNTER — Inpatient Hospital Stay: Payer: Medicare HMO

## 2021-10-27 VITALS — BP 158/90 | HR 53 | Temp 97.8°F | Resp 18 | Ht 64.0 in | Wt 137.0 lb

## 2021-10-27 DIAGNOSIS — Z170 Estrogen receptor positive status [ER+]: Secondary | ICD-10-CM | POA: Diagnosis not present

## 2021-10-27 DIAGNOSIS — C50412 Malignant neoplasm of upper-outer quadrant of left female breast: Secondary | ICD-10-CM | POA: Diagnosis not present

## 2021-10-27 DIAGNOSIS — K5900 Constipation, unspecified: Secondary | ICD-10-CM | POA: Diagnosis not present

## 2021-10-27 DIAGNOSIS — D6959 Other secondary thrombocytopenia: Secondary | ICD-10-CM | POA: Diagnosis not present

## 2021-10-27 DIAGNOSIS — M85859 Other specified disorders of bone density and structure, unspecified thigh: Secondary | ICD-10-CM | POA: Diagnosis not present

## 2021-10-27 DIAGNOSIS — Z5111 Encounter for antineoplastic chemotherapy: Secondary | ICD-10-CM | POA: Diagnosis not present

## 2021-10-27 DIAGNOSIS — R110 Nausea: Secondary | ICD-10-CM | POA: Diagnosis not present

## 2021-10-27 DIAGNOSIS — D709 Neutropenia, unspecified: Secondary | ICD-10-CM | POA: Diagnosis not present

## 2021-10-27 DIAGNOSIS — N6489 Other specified disorders of breast: Secondary | ICD-10-CM | POA: Diagnosis not present

## 2021-10-27 MED ORDER — SODIUM CHLORIDE 0.9 % IV SOLN
400.0000 mg/m2 | Freq: Once | INTRAVENOUS | Status: AC
  Administered 2021-10-27: 680 mg via INTRAVENOUS
  Filled 2021-10-27: qty 20

## 2021-10-27 MED ORDER — FLUOROURACIL CHEMO INJECTION 2.5 GM/50ML
400.0000 mg/m2 | Freq: Once | INTRAVENOUS | Status: AC
  Administered 2021-10-27: 650 mg via INTRAVENOUS
  Filled 2021-10-27: qty 13

## 2021-10-27 MED ORDER — SODIUM CHLORIDE 0.9 % IV SOLN: Freq: Once | INTRAVENOUS | Status: AC

## 2021-10-27 MED ORDER — METHOTREXATE SODIUM (PF) CHEMO INJECTION 250 MG/10ML
23.8000 mg/m2 | Freq: Once | INTRAMUSCULAR | Status: AC
  Administered 2021-10-27: 40 mg via INTRAVENOUS
  Filled 2021-10-27: qty 1.6

## 2021-10-27 MED ORDER — SODIUM CHLORIDE 0.9 % IV SOLN
10.0000 mg | Freq: Once | INTRAVENOUS | Status: AC
  Administered 2021-10-27: 10 mg via INTRAVENOUS
  Filled 2021-10-27: qty 10

## 2021-10-27 MED ORDER — HEPARIN SOD (PORK) LOCK FLUSH 100 UNIT/ML IV SOLN
500.0000 [IU] | Freq: Once | INTRAVENOUS | Status: AC | PRN
  Administered 2021-10-27: 500 [IU]

## 2021-10-27 MED ORDER — SODIUM CHLORIDE 0.9% FLUSH
10.0000 mL | INTRAVENOUS | Status: DC | PRN
  Administered 2021-10-27: 10 mL

## 2021-10-27 MED ORDER — PALONOSETRON HCL INJECTION 0.25 MG/5ML
0.2500 mg | Freq: Once | INTRAVENOUS | Status: AC
  Administered 2021-10-27: 0.25 mg via INTRAVENOUS
  Filled 2021-10-27: qty 5

## 2021-10-27 NOTE — Patient Instructions (Signed)
[facility]  Discharge Instructions: Thank you for choosing [facility] to provide your oncology and hematology care.  If you have a lab appointment with the [facility], please go directly to the [facility] and check in at the registration area.   Wear comfortable clothing and clothing appropriate for easy access to any Portacath or PICC line.   We strive to give you quality time with your provider. You may need to reschedule your appointment if you arrive late (15 or more minutes).  Arriving late affects you and other patients whose appointments are after yours.  Also, if you miss three or more appointments without notifying the office, you may be dismissed from the clinic at the provider's discretion.      For prescription refill requests, have your pharmacy contact our office and allow 72 hours for refills to be completed.    Today you received the following chemotherapy and/or immunotherapy agents: Methotrexate, Fluorouracil, CytoxanFluorouracil Injection What is this medication? FLUOROURACIL (flure oh YOOR a sil) treats some types of cancer. It works by slowing down the growth of cancer cells. This medicine may be used for other purposes; ask your health care provider or pharmacist if you have questions. COMMON BRAND NAME(S): Adrucil What should I tell my care team before I take this medication? They need to know if you have any of these conditions: Blood disorders Dihydropyrimidine dehydrogenase (DPD) deficiency Infection, such as chickenpox, cold sores, herpes Kidney disease Liver disease Poor nutrition Recent or ongoing radiation therapy An unusual or allergic reaction to fluorouracil, other medications, foods, dyes, or preservatives If you or your partner are pregnant or trying to get pregnant Breast-feeding How should I use this medication? This medication is injected into a vein. It is administered by your care team in a hospital or  clinic setting. Talk to your care team about the use of this medication in children. Special care may be needed. Overdosage: If you think you have taken too much of this medicine contact a poison control center or emergency room at once. NOTE: This medicine is only for you. Do not share this medicine with others. What if I miss a dose? Keep appointments for follow-up doses. It is important not to miss your dose. Call your care team if you are unable to keep an appointment. What may interact with this medication? Do not take this medication with any of the following: Live virus vaccines This medication may also interact with the following: Medications that treat or prevent blood clots, such as warfarin, enoxaparin, dalteparin This list may not describe all possible interactions. Give your health care provider a list of all the medicines, herbs, non-prescription drugs, or dietary supplements you use. Also tell them if you smoke, drink alcohol, or use illegal drugs. Some items may interact with your medicine. What should I watch for while using this medication? Your condition will be monitored carefully while you are receiving this medication. This medication may make you feel generally unwell. This is not uncommon as chemotherapy can affect healthy cells as well as cancer cells. Report any side effects. Continue your course of treatment even though you feel ill unless your care team tells you to stop. In some cases, you may be given additional medications to help with side effects. Follow all directions for their use. This medication may increase your risk of getting an infection. Call your care team for advice if you get a fever, chills, sore throat, or other  symptoms of a cold or flu. Do not treat yourself. Try to avoid being around people who are sick. This medication may increase your risk to bruise or bleed. Call your care team if you notice any unusual bleeding. Be careful brushing or flossing  your teeth or using a toothpick because you may get an infection or bleed more easily. If you have any dental work done, tell your dentist you are receiving this medication. Avoid taking medications that contain aspirin, acetaminophen, ibuprofen, naproxen, or ketoprofen unless instructed by your care team. These medications may hide a fever. Do not treat diarrhea with over the counter products. Contact your care team if you have diarrhea that lasts more than 2 days or if it is severe and watery. This medication can make you more sensitive to the sun. Keep out of the sun. If you cannot avoid being in the sun, wear protective clothing and sunscreen. Do not use sun lamps, tanning beds, or tanning booths. Talk to your care team if you or your partner wish to become pregnant or think you might be pregnant. This medication can cause serious birth defects if taken during pregnancy and for 3 months after the last dose. A reliable form of contraception is recommended while taking this medication and for 3 months after the last dose. Talk to your care team about effective forms of contraception. Do not father a child while taking this medication and for 3 months after the last dose. Use a condom while having sex during this time period. Do not breastfeed while taking this medication. This medication may cause infertility. Talk to your care team if you are concerned about your fertility. What side effects may I notice from receiving this medication? Side effects that you should report to your care team as soon as possible: Allergic reactions--skin rash, itching, hives, swelling of the face, lips, tongue, or throat Heart attack--pain or tightness in the chest, shoulders, arms, or jaw, nausea, shortness of breath, cold or clammy skin, feeling faint or lightheaded Heart failure--shortness of breath, swelling of the ankles, feet, or hands, sudden weight gain, unusual weakness or fatigue Heart rhythm changes--fast or  irregular heartbeat, dizziness, feeling faint or lightheaded, chest pain, trouble breathing High ammonia level--unusual weakness or fatigue, confusion, loss of appetite, nausea, vomiting, seizures Infection--fever, chills, cough, sore throat, wounds that don't heal, pain or trouble when passing urine, general feeling of discomfort or being unwell Low red blood cell level--unusual weakness or fatigue, dizziness, headache, trouble breathing Pain, tingling, or numbness in the hands or feet, muscle weakness, change in vision, confusion or trouble speaking, loss of balance or coordination, trouble walking, seizures Redness, swelling, and blistering of the skin over hands and feet Severe or prolonged diarrhea Unusual bruising or bleeding Side effects that usually do not require medical attention (report to your care team if they continue or are bothersome): Dry skin Headache Increased tears Nausea Pain, redness, or swelling with sores inside the mouth or throat Sensitivity to light Vomiting This list may not describe all possible side effects. Call your doctor for medical advice about side effects. You may report side effects to FDA at 1-800-FDA-1088. Where should I keep my medication? This medication is given in a hospital or clinic. It will not be stored at home. NOTE: This sheet is a summary. It may not cover all possible information. If you have questions about this medicine, talk to your doctor, pharmacist, or health care provider.  2023 Elsevier/Gold Standard (2021-06-28 00:00:00) Cyclophosphamide Injection What  is this medication? CYCLOPHOSPHAMIDE (sye kloe FOSS fa mide) treats some types of cancer. It works by slowing down the growth of cancer cells. This medicine may be used for other purposes; ask your health care provider or pharmacist if you have questions. COMMON BRAND NAME(S): Cyclophosphamide, Cytoxan, Neosar What should I tell my care team before I take this medication? They  need to know if you have any of these conditions: Heart disease Irregular heartbeat or rhythm Infection Kidney problems Liver disease Low blood cell levels (white cells, platelets, or red blood cells) Lung disease Previous radiation Trouble passing urine An unusual or allergic reaction to cyclophosphamide, other medications, foods, dyes, or preservatives Pregnant or trying to get pregnant Breast-feeding How should I use this medication? This medication is injected into a vein. It is given by your care team in a hospital or clinic setting. Talk to your care team about the use of this medication in children. Special care may be needed. Overdosage: If you think you have taken too much of this medicine contact a poison control center or emergency room at once. NOTE: This medicine is only for you. Do not share this medicine with others. What if I miss a dose? Keep appointments for follow-up doses. It is important not to miss your dose. Call your care team if you are unable to keep an appointment. What may interact with this medication? Amphotericin B Amiodarone Azathioprine Certain antivirals for HIV or hepatitis Certain medications for blood pressure, such as enalapril, lisinopril, quinapril Cyclosporine Diuretics Etanercept Indomethacin Medications that relax muscles Metronidazole Natalizumab Tamoxifen Warfarin This list may not describe all possible interactions. Give your health care provider a list of all the medicines, herbs, non-prescription drugs, or dietary supplements you use. Also tell them if you smoke, drink alcohol, or use illegal drugs. Some items may interact with your medicine. What should I watch for while using this medication? This medication may make you feel generally unwell. This is not uncommon as chemotherapy can affect healthy cells as well as cancer cells. Report any side effects. Continue your course of treatment even though you feel ill unless your care  team tells you to stop. You may need blood work while you are taking this medication. This medication may increase your risk of getting an infection. Call your care team for advice if you get a fever, chills, sore throat, or other symptoms of a cold or flu. Do not treat yourself. Try to avoid being around people who are sick. Avoid taking medications that contain aspirin, acetaminophen, ibuprofen, naproxen, or ketoprofen unless instructed by your care team. These medications may hide a fever. Be careful brushing or flossing your teeth or using a toothpick because you may get an infection or bleed more easily. If you have any dental work done, tell your dentist you are receiving this medication. Drink water or other fluids as directed. Urinate often, even at night. Some products may contain alcohol. Ask your care team if this medication contains alcohol. Be sure to tell all care teams you are taking this medicine. Certain medicines, like metronidazole and disulfiram, can cause an unpleasant reaction when taken with alcohol. The reaction includes flushing, headache, nausea, vomiting, sweating, and increased thirst. The reaction can last from 30 minutes to several hours. Talk to your care team if you wish to become pregnant or think you might be pregnant. This medication can cause serious birth defects if taken during pregnancy and for 1 year after the last dose. A negative pregnancy  test is required before starting this medication. A reliable form of contraception is recommended while taking this medication and for 1 year after the last dose. Talk to your care team about reliable forms of contraception. Do not father a child while taking this medication and for 4 months after the last dose. Use a condom during this time period. Do not breast-feed while taking this medication or for 1 week after the last dose. This medication may cause infertility. Talk to your care team if you are concerned about your  fertility. Talk to your care team about your risk of cancer. You may be more at risk for certain types of cancer if you take this medication. What side effects may I notice from receiving this medication? Side effects that you should report to your care team as soon as possible: Allergic reactions--skin rash, itching, hives, swelling of the face, lips, tongue, or throat Dry cough, shortness of breath or trouble breathing Heart failure--shortness of breath, swelling of the ankles, feet, or hands, sudden weight gain, unusual weakness or fatigue Heart muscle inflammation--unusual weakness or fatigue, shortness of breath, chest pain, fast or irregular heartbeat, dizziness, swelling of the ankles, feet, or hands Heart rhythm changes--fast or irregular heartbeat, dizziness, feeling faint or lightheaded, chest pain, trouble breathing Infection--fever, chills, cough, sore throat, wounds that don't heal, pain or trouble when passing urine, general feeling of discomfort or being unwell Kidney injury--decrease in the amount of urine, swelling of the ankles, hands, or feet Liver injury--right upper belly pain, loss of appetite, nausea, light-colored stool, dark yellow or brown urine, yellowing skin or eyes, unusual weakness or fatigue Low red blood cell level--unusual weakness or fatigue, dizziness, headache, trouble breathing Low sodium level--muscle weakness, fatigue, dizziness, headache, confusion Red or dark brown urine Unusual bruising or bleeding Side effects that usually do not require medical attention (report to your care team if they continue or are bothersome): Hair loss Irregular menstrual cycles or spotting Loss of appetite Nausea Pain, redness, or swelling with sores inside the mouth or throat Vomiting This list may not describe all possible side effects. Call your doctor for medical advice about side effects. You may report side effects to FDA at 1-800-FDA-1088. Where should I keep my  medication? This medication is given in a hospital or clinic. It will not be stored at home. NOTE: This sheet is a summary. It may not cover all possible information. If you have questions about this medicine, talk to your doctor, pharmacist, or health care provider.  2023 Elsevier/Gold Standard (2021-06-01 00:00:00) Methotrexate Injection What is this medication? METHOTREXATE (METH oh TREX ate) treats inflammatory conditions such as arthritis and psoriasis. It works by decreasing inflammation, which can reduce pain and prevent long-term injury to the joints and skin. It may also be used to treat some types of cancer. It works by slowing down the growth of cancer cells. This medicine may be used for other purposes; ask your health care provider or pharmacist if you have questions. What should I tell my care team before I take this medication? They need to know if you have any of these conditions: Fluid in the stomach area or lungs If you often drink alcohol Infection or immune system problems Kidney disease Liver disease Low blood counts (white cells, platelets, or red blood cells) Lung disease Recent or ongoing radiation Recent or upcoming vaccine Stomach ulcers Ulcerative colitis An unusual or allergic reaction to methotrexate, other medications, foods, dyes, or preservatives Pregnant or trying to get  pregnant Breast-feeding How should I use this medication? This medication is for infusion into a vein or for injection into muscle or into the spinal fluid (whichever applies). It is usually given in a hospital or clinic setting. In rare cases, you might get this medication at home. You will be taught how to give this medication. Use exactly as directed. Take your medication at regular intervals. Do not take your medication more often than directed. If this medication is used for arthritis or psoriasis, it should be taken weekly, NOT daily. It is important that you put your used needles  and syringes in a special sharps container. Do not put them in a trash can. If you do not have a sharps container, call your pharmacist or care team to get one. Talk to your care team about the use of this medication in children. While this medication may be prescribed for children as young as 2 years for selected conditions, precautions do apply. Overdosage: If you think you have taken too much of this medicine contact a poison control center or emergency room at once. NOTE: This medicine is only for you. Do not share this medicine with others. What if I miss a dose? It is important not to miss your dose. Call your care team if you are unable to keep an appointment. If you give yourself the medication, and you miss a dose, talk with your care team. Do not take double or extra doses. What may interact with this medication? Do not take this medication with any of the following: Acitretin This medication may also interact with the following: Aspirin or aspirin-like medications including salicylates Azathioprine Certain antibiotics like chloramphenicol, penicillin, tetracycline Certain medications that treat or prevent blood clots like warfarin, apixaban, dabigatran, and rivaroxaban Certain medications for stomach problems like esomeprazole, omeprazole, pantoprazole Cyclosporine Dapsone Diuretics Folic acid Gold Hydroxychloroquine Live virus vaccines Medications for infection like acyclovir, adefovir, amphotericin B, bacitracin, cidofovir, foscarnet, ganciclovir, gentamicin, pentamidine, vancomycin Mercaptopurine NSAIDs, medications for pain and inflammation, like ibuprofen or naproxen Pamidronate Pemetrexed Penicillamine Phenylbutazone Phenytoin Probenecid Pyrimethamine Retinoids such as isotretinoin and tretinoin Steroid medications like prednisone or cortisone Sulfonamides like sulfasalazine and trimethoprim/sulfamethoxazole Theophylline Zoledronic acid This list may not describe  all possible interactions. Give your health care provider a list of all the medicines, herbs, non-prescription drugs, or dietary supplements you use. Also tell them if you smoke, drink alcohol, or use illegal drugs. Some items may interact with your medicine. What should I watch for while using this medication? This medication may make you feel generally unwell. This is not uncommon as chemotherapy can affect healthy cells as well as cancer cells. Report any side effects. Continue your course of treatment even though you feel ill unless your care team tells you to stop. Your condition will be monitored carefully while you are receiving this medication. Avoid alcoholic drinks. This medication can cause serious side effects. To reduce the risk, your care team may give you other medications to take before receiving this one. Be sure to follow the directions from your care team. This medication can make you more sensitive to the sun. Keep out of the sun. If you cannot avoid being in the sun, wear protective clothing and use sunscreen. Do not use sun lamps or tanning beds/booths. You may get drowsy or dizzy. Do not drive, use machinery, or do anything that needs mental alertness until you know how this medication affects you. Do not stand or sit up quickly, especially if you are  an older patient. This reduces the risk of dizzy or fainting spells. You may need blood work while you are taking this medication. Call your care team for advice if you get a fever, chills or sore throat, or other symptoms of a cold or flu. Do not treat yourself. This medication decreases your body's ability to fight infections. Try to avoid being around people who are sick. This medication may increase your risk to bruise or bleed. Call your care team if you notice any unusual bleeding. Be careful brushing or flossing your teeth or using a toothpick because you may get an infection or bleed more easily. If you have any dental work  done, tell your dentist you are receiving this medication Check with your care team if you get an attack of severe diarrhea, nausea and vomiting, or if you sweat a lot. The loss of too much body fluid can make it dangerous for you to take this medication. Talk to your care team about your risk of cancer. You may be more at risk for certain types of cancers if you take this medication. Do not become pregnant while taking this medication or for 6 months after stopping it. Women should inform their care team if they wish to become pregnant or think they might be pregnant. Men should not father a child while taking this medication and for 3 months after stopping it. There is potential for serious harm to an unborn child. Talk to your care team for more information. Do not breast-feed an infant while taking this medication or for 1 week after stopping it. This medication may make it more difficult to get pregnant or father a child. Talk to your care team if you are concerned about your fertility. What side effects may I notice from receiving this medication? Side effects that you should report to your care team as soon as possible: Allergic reactions--skin rash, itching, hives, swelling of the face, lips, tongue, or throat Blood clot--pain, swelling, or warmth in the leg, shortness of breath, chest pain Dry cough, shortness of breath or trouble breathing Infection--fever, chills, cough, sore throat, wounds that don't heal, pain or trouble when passing urine, general feeling of discomfort or being unwell Kidney injury--decrease in the amount of urine, swelling of the ankles, hands, or feet Liver injury--right upper belly pain, loss of appetite, nausea, light-colored stool, dark yellow or brown urine, yellowing of the skin or eyes, unusual weakness or fatigue Low red blood cell count--unusual weakness or fatigue, dizziness, headache, trouble breathing Redness, blistering, peeling, or loosening of the skin,  including inside the mouth Seizures Unusual bruising or bleeding Side effects that usually do not require medical attention (report to your care team if they continue or are bothersome): Diarrhea Dizziness Hair loss Nausea Pain, redness, or swelling with sores inside the mouth or throat Vomiting This list may not describe all possible side effects. Call your doctor for medical advice about side effects. You may report side effects to FDA at 1-800-FDA-1088. Where should I keep my medication? This medication is given in a hospital or clinic. It will not be stored at home. NOTE: This sheet is a summary. It may not cover all possible information. If you have questions about this medicine, talk to your doctor, pharmacist, or health care provider.  2023 Elsevier/Gold Standard (2020-04-26 00:00:00)       To help prevent nausea and vomiting after your treatment, we encourage you to take your nausea medication as directed.  BELOW ARE SYMPTOMS  THAT SHOULD BE REPORTED IMMEDIATELY: *FEVER GREATER THAN 100.4 F (38 C) OR HIGHER *CHILLS OR SWEATING *NAUSEA AND VOMITING THAT IS NOT CONTROLLED WITH YOUR NAUSEA MEDICATION *UNUSUAL SHORTNESS OF BREATH *UNUSUAL BRUISING OR BLEEDING *URINARY PROBLEMS (pain or burning when urinating, or frequent urination) *BOWEL PROBLEMS (unusual diarrhea, constipation, pain near the anus) TENDERNESS IN MOUTH AND THROAT WITH OR WITHOUT PRESENCE OF ULCERS (sore throat, sores in mouth, or a toothache) UNUSUAL RASH, SWELLING OR PAIN  UNUSUAL VAGINAL DISCHARGE OR ITCHING   Items with * indicate a potential emergency and should be followed up as soon as possible or go to the Emergency Department if any problems should occur.  Please show the CHEMOTHERAPY ALERT CARD or IMMUNOTHERAPY ALERT CARD at check-in to the Emergency Department and triage nurse.  Should you have questions after your visit or need to cancel or reschedule your appointment, please contact [facility]  Dept: [phone]  and follow the prompts.  Office hours are 8:00 a.m. to 4:30 p.m. Monday - Friday. Please note that voicemails left after 4:00 p.m. may not be returned until the following business day.  We are closed weekends and major holidays. You have access to a nurse at all times for urgent questions. Please call the main number to the clinic Dept: [phone] and follow the prompts.  For any non-urgent questions, you may also contact your provider using MyChart. We now offer e-Visits for anyone 24 and older to request care online for non-urgent symptoms. For details visit [email or web address].   Also download the MyChart app! Go to the app store, search "MyChart", open the app, select [facility], and log in with your MyChart username and password.  Masks are optional in the cancer centers. If you would like for your care team to wear a mask while they are taking care of you, please let them know. You may have one support person who is at least 81 years old accompany you for your appointments.

## 2021-11-06 ENCOUNTER — Other Ambulatory Visit: Payer: Self-pay | Admitting: Pharmacist

## 2021-11-06 DIAGNOSIS — C50412 Malignant neoplasm of upper-outer quadrant of left female breast: Secondary | ICD-10-CM

## 2021-11-08 ENCOUNTER — Encounter: Payer: Self-pay | Admitting: Oncology

## 2021-11-14 ENCOUNTER — Encounter: Payer: Self-pay | Admitting: Oncology

## 2021-11-14 ENCOUNTER — Telehealth: Payer: Self-pay

## 2021-11-14 DIAGNOSIS — H43812 Vitreous degeneration, left eye: Secondary | ICD-10-CM | POA: Diagnosis not present

## 2021-11-14 NOTE — Telephone Encounter (Addendum)
Ashley Banwart,RN - Pt notified of below and that she will need to R/S appt. I gave her [facility] phone #. Dr Hinton Rao - yes, we will need to postpone treatment by 2 weeks   Pt due for labs, and to see [facility] tomorrow. Chemo on Thursday.  Her sister in law has tested positive for COVID. She was with her sister in law on Sat. Pt is asymptomatic. How long to reschedule her? She will need to mask for 10 days and test @ day 6. Message sent to Dr Hinton Rao, [facility], Penelope Galas @ [phone]-awc

## 2021-11-15 ENCOUNTER — Other Ambulatory Visit: Payer: Medicare HMO

## 2021-11-15 ENCOUNTER — Ambulatory Visit: Payer: Medicare HMO | Admitting: Hematology and Oncology

## 2021-11-15 ENCOUNTER — Other Ambulatory Visit: Payer: Self-pay

## 2021-11-17 ENCOUNTER — Ambulatory Visit: Payer: Medicare HMO

## 2021-11-19 ENCOUNTER — Other Ambulatory Visit: Payer: Self-pay

## 2021-11-20 ENCOUNTER — Encounter: Payer: Self-pay | Admitting: Oncology

## 2021-11-29 ENCOUNTER — Inpatient Hospital Stay: Payer: Medicare HMO | Admitting: Hematology and Oncology

## 2021-11-29 ENCOUNTER — Encounter: Payer: Self-pay | Admitting: Hematology and Oncology

## 2021-11-29 ENCOUNTER — Inpatient Hospital Stay: Payer: Medicare HMO | Attending: Oncology

## 2021-11-29 DIAGNOSIS — C50412 Malignant neoplasm of upper-outer quadrant of left female breast: Secondary | ICD-10-CM | POA: Diagnosis not present

## 2021-11-29 DIAGNOSIS — M8580 Other specified disorders of bone density and structure, unspecified site: Secondary | ICD-10-CM | POA: Diagnosis not present

## 2021-11-29 DIAGNOSIS — D649 Anemia, unspecified: Secondary | ICD-10-CM | POA: Diagnosis not present

## 2021-11-29 DIAGNOSIS — Z170 Estrogen receptor positive status [ER+]: Secondary | ICD-10-CM

## 2021-11-29 DIAGNOSIS — T451X5A Adverse effect of antineoplastic and immunosuppressive drugs, initial encounter: Secondary | ICD-10-CM | POA: Insufficient documentation

## 2021-11-29 DIAGNOSIS — Z780 Asymptomatic menopausal state: Secondary | ICD-10-CM

## 2021-11-29 DIAGNOSIS — Z5111 Encounter for antineoplastic chemotherapy: Secondary | ICD-10-CM | POA: Insufficient documentation

## 2021-11-29 DIAGNOSIS — Z79899 Other long term (current) drug therapy: Secondary | ICD-10-CM | POA: Insufficient documentation

## 2021-11-29 DIAGNOSIS — D6959 Other secondary thrombocytopenia: Secondary | ICD-10-CM | POA: Insufficient documentation

## 2021-11-29 DIAGNOSIS — D701 Agranulocytosis secondary to cancer chemotherapy: Secondary | ICD-10-CM | POA: Insufficient documentation

## 2021-11-29 LAB — BASIC METABOLIC PANEL
BUN: 22 — AB (ref 4–21)
CO2: 29 — AB (ref 13–22)
Chloride: 106 (ref 99–108)
Creatinine: 0.8 (ref 0.5–1.1)
Glucose: 107
Potassium: 3.6 mEq/L (ref 3.5–5.1)
Sodium: 140 (ref 137–147)

## 2021-11-29 LAB — COMPREHENSIVE METABOLIC PANEL
Albumin: 3.9 (ref 3.5–5.0)
Calcium: 9.1 (ref 8.7–10.7)

## 2021-11-29 LAB — CBC AND DIFFERENTIAL
HCT: 42 (ref 36–46)
Hemoglobin: 14.1 (ref 12.0–16.0)
Neutrophils Absolute: 2.97
Platelets: 124 10*3/uL — AB (ref 150–400)
WBC: 5.3

## 2021-11-29 LAB — HEPATIC FUNCTION PANEL
ALT: 31 U/L (ref 7–35)
AST: 39 — AB (ref 13–35)
Alkaline Phosphatase: 79 (ref 25–125)
Bilirubin, Total: 0.8

## 2021-11-29 LAB — CBC: RBC: 4.6 (ref 3.87–5.11)

## 2021-11-29 NOTE — Assessment & Plan Note (Addendum)
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  Pathology revealed a 28 mm with 2 negative nodes in the background of high-grade ductal carcinoma in situ.  Margins were clear.  Estrogen receptors positive at 30% and progesterone receptors are negative and HER2 negative.  Ki-67 was 60%.    She was felt to be at higher risk for recurrence, so we discussed chemotherapy such as TC for 4 cycles.  However, we would have to take into consideration her advanced age of 22.  Her EndoPredict score then came back at 5.2, correlating with a 48% risk of distant recurrence in the next 10 years. She would have 24% benefit with chemotherapy.  It was decided to give her adjuvant CMF chemotherapy, which is an older and usually a more easily tolerated regimen.    She had persistent cytopenias with her first cycle of CMF, so her second cycle was delayed a week and doses were reduced.  She was due for a third cycle on September 7, but this was delayed due to [place] exposure.  She states she and her husband were exposed at the beginning of the month.  Her husband then tested positive on September 11.  The patient has had 2 negative test in the interim.  She remains asymptomatic.  She will proceed with a third cycle of CMF this week.  We will plan to see her back in 3 weeks for repeat clinical assessment prior to a fourth cycle.   She received her first cycle on July 27 and tolerated this well.  She has mild nausea controlled with ginger.  She reports constipation, for which she is taking Colace.  I recommended she take Senokot-S 2 tablets twice daily and when she is more regular decreased to 2 tablets at bedtime.  She has mild neutropenia and thrombocytopenia secondary to chemotherapy.  This should recover to baseline by the time of her next infusion.  We will plan to see her back on July 15 for repeat clinical assessment prior to second cycle of CMF chemotherapy.

## 2021-11-29 NOTE — Progress Notes (Signed)
Ashley Villa  [address] 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  Pathology revealed a 28 mm with 2 negative nodes in the background of high-grade ductal carcinoma in situ.  Margins were clear.  Estrogen receptors positive at 30% and progesterone receptors are negative and HER2 negative.  Ki-67 was 60%.    She was felt to be at higher risk for recurrence, so we discussed chemotherapy such as TC for 4 cycles.  However, we would have to take into consideration her advanced age of 57.  Her EndoPredict score then came back at 5.2, correlating with a 48% risk of distant recurrence in the next 10 years. She would have 24% benefit with chemotherapy.  It was decided to give her adjuvant CMF chemotherapy, which is an older and usually a more easily tolerated regimen.    She had persistent cytopenias with her first cycle of CMF, so her second cycle was delayed a week and doses were reduced.  She was due for a third cycle on September 7, but this was delayed due to [place] exposure.  She states she and her husband were exposed at the beginning of the month.  Her husband then tested positive on September 11.  The patient has had 2 negative test in the interim.  She remains asymptomatic.  She will proceed with a third cycle of CMF this week.  We will plan to see her back in 3 weeks for repeat clinical assessment prior to a fourth cycle.   She received her first cycle on July 27 and tolerated this well.  She has mild nausea controlled with ginger.  She reports constipation, for which she is taking Colace.  I recommended she take Senokot-S 2 tablets twice daily and when she is more regular decreased to 2 tablets at bedtime.  She has mild  neutropenia and thrombocytopenia secondary to chemotherapy.  This should recover to baseline by the time of her next infusion.  We will plan to see her back on July 15 for repeat clinical assessment prior to second cycle of CMF chemotherapy.   The patient understands the plans discussed today and is in agreement with them.  She knows to contact our office if she develops concerns prior to her next appointment.   I provided 20 minutes of face-to-face time during this encounter and > 50% was spent counseling as documented under my assessment and plan.    [name], PA-C  [facility] [address] [place] [address] Dept: [phone] Dept Fax: [phone]   Orders Placed This Encounter  Procedures   CBC and differential    This external order was created through the Results Console.   CBC    This external order was created through the Results Console.   Basic metabolic panel    This external order was created through the Results Console.   Comprehensive metabolic panel    This external order was created through the Results Console.   Hepatic function panel    This external order was created through the Results Console.      CHIEF COMPLAINT:  CC: Stage IIB hormone receptor hormone receptor positive breast cancer  Current Treatment:  Cyclophosphamide/methotrexate/5-fluorouracil (  CMF) every 3 weeks  HISTORY OF PRESENT ILLNESS:   Oncology History  Breast cancer of upper-outer quadrant of left female breast ([place])  06/01/2021 Initial Diagnosis   Breast cancer of upper-outer quadrant of left female breast ([place])   07/13/2021 Cancer Staging   Staging form: Breast, AJCC 8th Edition - Clinical stage from 07/13/2021: Stage IIB (cT2, cN0(sn), cM0, G3, ER+, PR-, HER2-) - Signed by [name], MD on 08/10/2021 Histopathologic type: Infiltrating duct carcinoma, NOS Stage prefix: Initial diagnosis Method of  lymph node assessment: Sentinel lymph node biopsy Nuclear grade: G3 Histologic grading system: 3 grade system Laterality: Left Tumor size (mm): 28 Lymph-vascular invasion (LVI): LVI not present (absent)/not identified Diagnostic confirmation: Positive histology Specimen type: Excision Staged by: Managing physician Percentage of positive estrogen receptors (%): 30 Percentage of positive progesterone receptors (%): 0 HER2-IHC interpretation: Negative HER2-IHC value: Score 1+ Menopausal status: Postmenopausal Ki-67 (%): 60 Stage used in treatment planning: Yes National guidelines used in treatment planning: Yes Type of national guideline used in treatment planning: NCCN   09/29/2021 - 10/27/2021 Chemotherapy   Patient is on Treatment Plan : BREAST Adjuvant CMF IV q21d     09/29/2021 -  Chemotherapy   Patient is on Treatment Plan : BREAST Adjuvant CMF IV q21d         INTERVAL HISTORY:  Ashley Villa is here today for repeat clinical assessment prior to a 3rd cycle of CMF, which was delayed as she and her husband were exposed to [place] early this month and her husband tested positive on 9/11.  The patient denies symptoms of COVID infection and states she has tested negative twice in the interim.  She has only experienced mild nausea in association with chemotherapy. She denies fevers or chills. She denies pain. Her appetite is good. Her weight has decreased 4 pounds over last 4 weeks .  We obtained her bone density scan from Dr. Madalyn Rob office from September 2022.  This revealed osteopenia with a T score of -2 in the femur and a T score of 0 in the spine.  The patient states she is taking calcium and vitamin D daily.  She does not remember any previous discussion about treatment of her osteopenia.  We discussed starting treatment of her osteopenia, but she does not wish to do so while on chemotherapy .  REVIEW OF SYSTEMS:  Review of Systems  Constitutional:  Negative for appetite change, chills,  fatigue, fever and unexpected weight change.  HENT:   Negative for lump/mass, mouth sores and sore throat.   Respiratory:  Negative for cough and shortness of breath.   Cardiovascular:  Negative for chest pain and leg swelling.  Gastrointestinal:  Negative for abdominal pain, constipation, diarrhea, nausea and vomiting.  Endocrine: Negative for hot flashes.  Genitourinary:  Negative for difficulty urinating, dysuria, frequency and hematuria.   Musculoskeletal:  Negative for arthralgias, back pain and myalgias.  Skin:  Negative for rash.  Neurological:  Negative for dizziness and headaches.  Hematological:  Negative for adenopathy. Does not bruise/bleed easily.  Psychiatric/Behavioral:  Negative for depression and sleep disturbance. The patient is not nervous/anxious.      VITALS:  Blood pressure (!) 158/84, pulse (!) 55, temperature 97.6 F (36.4 C), temperature source Oral, resp. rate 20, height '5\' 4"'  (1.626 m), weight 133 lb 12.8 oz (60.7 kg), SpO2 94 %.  Wt Readings from Last 3 Encounters:  11/29/21 133 lb 12.8 oz (60.7 kg)  10/27/21 137 lb 0.6 oz (62.2 kg)  10/25/21  137 lb 3.2 oz (62.2 kg)    Body mass index is 22.97 kg/m.  Performance status (ECOG): 0 - Asymptomatic  PHYSICAL EXAM:  Physical Exam Vitals and nursing note reviewed.  Constitutional:      General: She is not in acute distress.    Appearance: Normal appearance.  HENT:     Head: Normocephalic and atraumatic.     Mouth/Throat:     Mouth: Mucous membranes are moist.     Pharynx: Oropharynx is clear. No oropharyngeal exudate or posterior oropharyngeal erythema.  Eyes:     General: No scleral icterus.    Extraocular Movements: Extraocular movements intact.     Conjunctiva/sclera: Conjunctivae normal.     Pupils: Pupils are equal, round, and reactive to light.  Cardiovascular:     Rate and Rhythm: Normal rate and regular rhythm.     Heart sounds: Normal heart sounds. No murmur heard.    No friction rub. No  gallop.  Pulmonary:     Effort: Pulmonary effort is normal.     Breath sounds: Normal breath sounds. No wheezing, rhonchi or rales.  Abdominal:     General: There is no distension.     Palpations: Abdomen is soft. There is no hepatomegaly, splenomegaly or mass.     Tenderness: There is no abdominal tenderness.  Musculoskeletal:        General: Normal range of motion.     Cervical back: Normal range of motion and neck supple. No tenderness.     Right lower leg: No edema.     Left lower leg: No edema.  Lymphadenopathy:     Cervical: No cervical adenopathy.     Upper Body:     Right upper body: No supraclavicular or axillary adenopathy.     Left upper body: No supraclavicular or axillary adenopathy.     Lower Body: No right inguinal adenopathy. No left inguinal adenopathy.  Skin:    General: Skin is warm and dry.     Coloration: Skin is not jaundiced.     Findings: No rash.  Neurological:     Mental Status: She is alert and oriented to person, place, and time.     Cranial Nerves: No cranial nerve deficit.  Psychiatric:        Mood and Affect: Mood normal.        Behavior: Behavior normal.        Thought Content: Thought content normal.     LABS:      Latest Ref Rng & Units 11/29/2021   12:00 AM 10/25/2021   12:00 AM 10/18/2021   12:00 AM  CBC  WBC  5.3     5.2     2.5      Hemoglobin 12.0 - 16.0 14.1     13.8     13.1      Hematocrit 36 - 46 42     41     40      Platelets 150 - 400 K/uL 124     123     122         This result is from an external source.      Latest Ref Rng & Units 11/29/2021   12:00 AM 10/25/2021   12:00 AM 10/18/2021   12:00 AM  CMP  BUN 4 - '21 22     19     17      ' Creatinine 0.5 - 1.1 0.8     0.8  0.9      Sodium 137 - 147 140     141     138      Potassium 3.5 - 5.1 mEq/L 3.6     4.0     4.0      Chloride 99 - 108 106     105     104      CO2 13 - 22 29     33     31      Calcium 8.7 - 10.7 9.1     9.4     9.1      Alkaline Phos 25 - 125 79      87     87      AST 13 - 35 39     47     33      ALT 7 - 35 U/L 31     55     24         This result is from an external source.     No results found for: "CEA1", "CEA" / No results found for: "CEA1", "CEA" No results found for: "PSA1" No results found for: "WUJ811" No results found for: "CAN125"  No results found for: "TOTALPROTELP", "ALBUMINELP", "A1GS", "A2GS", "BETS", "BETA2SER", "GAMS", "MSPIKE", "SPEI" No results found for: "TIBC", "FERRITIN", "IRONPCTSAT" No results found for: "LDH"  STUDIES:  No results found.    HISTORY:   Past Medical History:  Diagnosis Date   H/O mitral valve repair    HTN (hypertension)    Status post left breast lumpectomy     Past Surgical History:  Procedure Laterality Date   TONSILLECTOMY      Family History  Problem Relation Age of Onset   Cancer Mother    Cancer Maternal Aunt     Social History:  reports that she has never smoked. She has never used smokeless tobacco. She reports that she does not drink alcohol and does not use drugs.The patient is accompanied by her husband today.  Allergies: No Known Allergies  Current Medications: Current Outpatient Medications  Medication Sig Dispense Refill   amoxicillin (AMOXIL) 500 MG capsule Prn prior to dentist visit with hx of mitral valve prolapse (Patient not taking: Reported on 11/29/2021)     Calcium Citrate-Vitamin D 315-5 MG-MCG TABS Take 1 tablet by mouth daily.     Cholecalciferol 25 MCG (1000 UT) capsule Take by mouth.     Coenzyme Q10 50 MG CAPS Take by mouth.     docusate sodium (COLACE) 100 MG capsule Take 100 mg by mouth 2 (two) times daily.     glucosamine-chondroitin 500-400 MG tablet Take 1 tablet by mouth daily.     levocetirizine (XYZAL) 5 MG tablet Take 5 mg by mouth daily.     losartan (COZAAR) 100 MG tablet Take 100 mg by mouth daily.     metoprolol tartrate (LOPRESSOR) 25 MG tablet Take by mouth.     multivitamin-lutein (OCUVITE-LUTEIN) CAPS capsule Take 1  capsule by mouth daily.     ondansetron (ZOFRAN) 8 MG tablet Take 1 tablet (8 mg total) by mouth 2 (two) times daily as needed for refractory nausea / vomiting (Start on day 3 after chemotherapy). 30 tablet 1   prochlorperazine (COMPAZINE) 10 MG tablet Take 1 tablet (10 mg total) by mouth every 6 (six) hours as needed for nausea or vomiting. 30 tablet 1   simvastatin (ZOCOR) 20 MG tablet Take 20 mg by mouth  at bedtime.     No current facility-administered medications for this visit.

## 2021-11-29 NOTE — Assessment & Plan Note (Signed)
We obtained her bone density scan from Dr. Hezzie Bump office from September 2022.  This revealed osteopenia with a T score of -2 in the femur and a T score of 0 in the spine.  The patient states she is taking calcium and vitamin D daily.  She does not remember any discussion about further treatment of her osteopenia.  As her risk of hip fracture in the next 10 years is 4.6, I would recommend starting treatment for her osteopenia, however, she is hesitant to do so while on chemotherapy treatment.  She does understand that further adjuvant hormonal therapy can worsen her osteopenia.

## 2021-11-30 MED FILL — Methotrexate Sodium Inj PF 50 MG/2ML (25 MG/ML): INTRAMUSCULAR | Qty: 1.6 | Status: AC

## 2021-11-30 MED FILL — Cyclophosphamide For Inj 1 GM: INTRAMUSCULAR | Qty: 34 | Status: AC

## 2021-11-30 MED FILL — Fluorouracil IV Soln 2.5 GM/50ML (50 MG/ML): INTRAVENOUS | Qty: 13 | Status: AC

## 2021-11-30 MED FILL — Dexamethasone Sodium Phosphate Inj 100 MG/10ML: INTRAMUSCULAR | Qty: 1 | Status: AC

## 2021-12-01 ENCOUNTER — Inpatient Hospital Stay: Payer: Medicare HMO

## 2021-12-01 VITALS — BP 132/90 | HR 59 | Temp 97.9°F | Resp 20 | Wt 137.1 lb

## 2021-12-01 DIAGNOSIS — Z5111 Encounter for antineoplastic chemotherapy: Secondary | ICD-10-CM | POA: Diagnosis not present

## 2021-12-01 DIAGNOSIS — T451X5A Adverse effect of antineoplastic and immunosuppressive drugs, initial encounter: Secondary | ICD-10-CM | POA: Diagnosis not present

## 2021-12-01 DIAGNOSIS — Z170 Estrogen receptor positive status [ER+]: Secondary | ICD-10-CM | POA: Diagnosis not present

## 2021-12-01 DIAGNOSIS — D701 Agranulocytosis secondary to cancer chemotherapy: Secondary | ICD-10-CM | POA: Diagnosis not present

## 2021-12-01 DIAGNOSIS — C50412 Malignant neoplasm of upper-outer quadrant of left female breast: Secondary | ICD-10-CM | POA: Diagnosis not present

## 2021-12-01 DIAGNOSIS — D6959 Other secondary thrombocytopenia: Secondary | ICD-10-CM | POA: Diagnosis not present

## 2021-12-01 DIAGNOSIS — Z79899 Other long term (current) drug therapy: Secondary | ICD-10-CM | POA: Diagnosis not present

## 2021-12-01 MED ORDER — SODIUM CHLORIDE 0.9% FLUSH
10.0000 mL | INTRAVENOUS | Status: DC | PRN
  Administered 2021-12-01: 10 mL

## 2021-12-01 MED ORDER — METHOTREXATE SODIUM CHEMO INJECTION (PF) 50 MG/2ML
23.7500 mg/m2 | Freq: Once | INTRAMUSCULAR | Status: AC
  Administered 2021-12-01: 40 mg via INTRAVENOUS
  Filled 2021-12-01: qty 1.6

## 2021-12-01 MED ORDER — SODIUM CHLORIDE 0.9 % IV SOLN
400.0000 mg/m2 | Freq: Once | INTRAVENOUS | Status: AC
  Administered 2021-12-01: 680 mg via INTRAVENOUS
  Filled 2021-12-01: qty 34

## 2021-12-01 MED ORDER — SODIUM CHLORIDE 0.9 % IV SOLN
10.0000 mg | Freq: Once | INTRAVENOUS | Status: AC
  Administered 2021-12-01: 10 mg via INTRAVENOUS
  Filled 2021-12-01: qty 10

## 2021-12-01 MED ORDER — SODIUM CHLORIDE 0.9 % IV SOLN: Freq: Once | INTRAVENOUS | Status: AC

## 2021-12-01 MED ORDER — PALONOSETRON HCL INJECTION 0.25 MG/5ML
0.2500 mg | Freq: Once | INTRAVENOUS | Status: AC
  Administered 2021-12-01: 0.25 mg via INTRAVENOUS
  Filled 2021-12-01: qty 5

## 2021-12-01 MED ORDER — HEPARIN SOD (PORK) LOCK FLUSH 100 UNIT/ML IV SOLN
500.0000 [IU] | Freq: Once | INTRAVENOUS | Status: AC | PRN
  Administered 2021-12-01: 500 [IU]

## 2021-12-01 MED ORDER — FLUOROURACIL CHEMO INJECTION 2.5 GM/50ML
400.0000 mg/m2 | Freq: Once | INTRAVENOUS | Status: AC
  Administered 2021-12-01: 650 mg via INTRAVENOUS
  Filled 2021-12-01: qty 13

## 2021-12-01 NOTE — Patient Instructions (Signed)
Cyclophosphamide Injection What is this medication? CYCLOPHOSPHAMIDE (sye kloe FOSS fa mide) treats some types of cancer. It works by slowing down the growth of cancer cells. This medicine may be used for other purposes; ask your health care provider or pharmacist if you have questions. COMMON BRAND NAME(S): Cyclophosphamide, Cytoxan, Neosar What should I tell my care team before I take this medication? They need to know if you have any of these conditions: Heart disease Irregular heartbeat or rhythm Infection Kidney problems Liver disease Low blood cell levels (white cells, platelets, or red blood cells) Lung disease Previous radiation Trouble passing urine An unusual or allergic reaction to cyclophosphamide, other medications, foods, dyes, or preservatives Pregnant or trying to get pregnant Breast-feeding How should I use this medication? This medication is injected into a vein. It is given by your care team in a hospital or clinic setting. Talk to your care team about the use of this medication in children. Special care may be needed. Overdosage: If you think you have taken too much of this medicine contact a poison control center or emergency room at once. NOTE: This medicine is only for you. Do not share this medicine with others. What if I miss a dose? Keep appointments for follow-up doses. It is important not to miss your dose. Call your care team if you are unable to keep an appointment. What may interact with this medication? Amphotericin B Amiodarone Azathioprine Certain antivirals for HIV or hepatitis Certain medications for blood pressure, such as enalapril, lisinopril, quinapril Cyclosporine Diuretics Etanercept Indomethacin Medications that relax muscles Metronidazole Natalizumab Tamoxifen Warfarin This list may not describe all possible interactions. Give your health care provider a list of all the medicines, herbs, non-prescription drugs, or dietary  supplements you use. Also tell them if you smoke, drink alcohol, or use illegal drugs. Some items may interact with your medicine. What should I watch for while using this medication? This medication may make you feel generally unwell. This is not uncommon as chemotherapy can affect healthy cells as well as cancer cells. Report any side effects. Continue your course of treatment even though you feel ill unless your care team tells you to stop. You may need blood work while you are taking this medication. This medication may increase your risk of getting an infection. Call your care team for advice if you get a fever, chills, sore throat, or other symptoms of a cold or flu. Do not treat yourself. Try to avoid being around people who are sick. Avoid taking medications that contain aspirin, acetaminophen, ibuprofen, naproxen, or ketoprofen unless instructed by your care team. These medications may hide a fever. Be careful brushing or flossing your teeth or using a toothpick because you may get an infection or bleed more easily. If you have any dental work done, tell your dentist you are receiving this medication. Drink water or other fluids as directed. Urinate often, even at night. Some products may contain alcohol. Ask your care team if this medication contains alcohol. Be sure to tell all care teams you are taking this medicine. Certain medicines, like metronidazole and disulfiram, can cause an unpleasant reaction when taken with alcohol. The reaction includes flushing, headache, nausea, vomiting, sweating, and increased thirst. The reaction can last from 30 minutes to several hours. Talk to your care team if you wish to become pregnant or think you might be pregnant. This medication can cause serious birth defects if taken during pregnancy and for 1 year after the last dose. A  negative pregnancy test is required before starting this medication. A reliable form of contraception is recommended while taking  this medication and for 1 year after the last dose. Talk to your care team about reliable forms of contraception. Do not father a child while taking this medication and for 4 months after the last dose. Use a condom during this time period. Do not breast-feed while taking this medication or for 1 week after the last dose. This medication may cause infertility. Talk to your care team if you are concerned about your fertility. Talk to your care team about your risk of cancer. You may be more at risk for certain types of cancer if you take this medication. What side effects may I notice from receiving this medication? Side effects that you should report to your care team as soon as possible: Allergic reactions--skin rash, itching, hives, swelling of the face, lips, tongue, or throat Dry cough, shortness of breath or trouble breathing Heart failure--shortness of breath, swelling of the ankles, feet, or hands, sudden weight gain, unusual weakness or fatigue Heart muscle inflammation--unusual weakness or fatigue, shortness of breath, chest pain, fast or irregular heartbeat, dizziness, swelling of the ankles, feet, or hands Heart rhythm changes--fast or irregular heartbeat, dizziness, feeling faint or lightheaded, chest pain, trouble breathing Infection--fever, chills, cough, sore throat, wounds that don't heal, pain or trouble when passing urine, general feeling of discomfort or being unwell Kidney injury--decrease in the amount of urine, swelling of the ankles, hands, or feet Liver injury--right upper belly pain, loss of appetite, nausea, light-colored stool, dark yellow or brown urine, yellowing skin or eyes, unusual weakness or fatigue Low red blood cell level--unusual weakness or fatigue, dizziness, headache, trouble breathing Low sodium level--muscle weakness, fatigue, dizziness, headache, confusion Red or dark brown urine Unusual bruising or bleeding Side effects that usually do not require medical  attention (report to your care team if they continue or are bothersome): Hair loss Irregular menstrual cycles or spotting Loss of appetite Nausea Pain, redness, or swelling with sores inside the mouth or throat Vomiting This list may not describe all possible side effects. Call your doctor for medical advice about side effects. You may report side effects to FDA at 1-800-FDA-1088. Where should I keep my medication? This medication is given in a hospital or clinic. It will not be stored at home. NOTE: This sheet is a summary. It may not cover all possible information. If you have questions about this medicine, talk to your doctor, pharmacist, or health care provider.  2023 [employer] Standard (2021-06-01 00:00:00) Fluorouracil Injection What is this medication? FLUOROURACIL (flure oh YOOR a sil) treats some types of cancer. It works by slowing down the growth of cancer cells. This medicine may be used for other purposes; ask your health care provider or pharmacist if you have questions. COMMON BRAND NAME(S): Adrucil What should I tell my care team before I take this medication? They need to know if you have any of these conditions: Blood disorders Dihydropyrimidine dehydrogenase (DPD) deficiency Infection, such as chickenpox, cold sores, herpes Kidney disease Liver disease Poor nutrition Recent or ongoing radiation therapy An unusual or allergic reaction to fluorouracil, other medications, foods, dyes, or preservatives If you or your partner are pregnant or trying to get pregnant Breast-feeding How should I use this medication? This medication is injected into a vein. It is administered by your care team in a hospital or clinic setting. Talk to your care team about the use of this medication in  children. Special care may be needed. Overdosage: If you think you have taken too much of this medicine contact a poison control center or emergency room at once. NOTE: This medicine is  only for you. Do not share this medicine with others. What if I miss a dose? Keep appointments for follow-up doses. It is important not to miss your dose. Call your care team if you are unable to keep an appointment. What may interact with this medication? Do not take this medication with any of the following: Live virus vaccines This medication may also interact with the following: Medications that treat or prevent blood clots, such as warfarin, enoxaparin, dalteparin This list may not describe all possible interactions. Give your health care provider a list of all the medicines, herbs, non-prescription drugs, or dietary supplements you use. Also tell them if you smoke, drink alcohol, or use illegal drugs. Some items may interact with your medicine. What should I watch for while using this medication? Your condition will be monitored carefully while you are receiving this medication. This medication may make you feel generally unwell. This is not uncommon as chemotherapy can affect healthy cells as well as cancer cells. Report any side effects. Continue your course of treatment even though you feel ill unless your care team tells you to stop. In some cases, you may be given additional medications to help with side effects. Follow all directions for their use. This medication may increase your risk of getting an infection. Call your care team for advice if you get a fever, chills, sore throat, or other symptoms of a cold or flu. Do not treat yourself. Try to avoid being around people who are sick. This medication may increase your risk to bruise or bleed. Call your care team if you notice any unusual bleeding. Be careful brushing or flossing your teeth or using a toothpick because you may get an infection or bleed more easily. If you have any dental work done, tell your dentist you are receiving this medication. Avoid taking medications that contain aspirin, acetaminophen, ibuprofen, naproxen, or  ketoprofen unless instructed by your care team. These medications may hide a fever. Do not treat diarrhea with over the counter products. Contact your care team if you have diarrhea that lasts more than 2 days or if it is severe and watery. This medication can make you more sensitive to the sun. Keep out of the sun. If you cannot avoid being in the sun, wear protective clothing and sunscreen. Do not use sun lamps, tanning beds, or tanning booths. Talk to your care team if you or your partner wish to become pregnant or think you might be pregnant. This medication can cause serious birth defects if taken during pregnancy and for 3 months after the last dose. A reliable form of contraception is recommended while taking this medication and for 3 months after the last dose. Talk to your care team about effective forms of contraception. Do not father a child while taking this medication and for 3 months after the last dose. Use a condom while having sex during this time period. Do not breastfeed while taking this medication. This medication may cause infertility. Talk to your care team if you are concerned about your fertility. What side effects may I notice from receiving this medication? Side effects that you should report to your care team as soon as possible: Allergic reactions--skin rash, itching, hives, swelling of the face, lips, tongue, or throat Heart attack--pain or tightness in the  chest, shoulders, arms, or jaw, nausea, shortness of breath, cold or clammy skin, feeling faint or lightheaded Heart failure--shortness of breath, swelling of the ankles, feet, or hands, sudden weight gain, unusual weakness or fatigue Heart rhythm changes--fast or irregular heartbeat, dizziness, feeling faint or lightheaded, chest pain, trouble breathing High ammonia level--unusual weakness or fatigue, confusion, loss of appetite, nausea, vomiting, seizures Infection--fever, chills, cough, sore throat, wounds that don't  heal, pain or trouble when passing urine, general feeling of discomfort or being unwell Low red blood cell level--unusual weakness or fatigue, dizziness, headache, trouble breathing Pain, tingling, or numbness in the hands or feet, muscle weakness, change in vision, confusion or trouble speaking, loss of balance or coordination, trouble walking, seizures Redness, swelling, and blistering of the skin over hands and feet Severe or prolonged diarrhea Unusual bruising or bleeding Side effects that usually do not require medical attention (report to your care team if they continue or are bothersome): Dry skin Headache Increased tears Nausea Pain, redness, or swelling with sores inside the mouth or throat Sensitivity to light Vomiting This list may not describe all possible side effects. Call your doctor for medical advice about side effects. You may report side effects to FDA at 1-800-FDA-1088. Where should I keep my medication? This medication is given in a hospital or clinic. It will not be stored at home. NOTE: This sheet is a summary. It may not cover all possible information. If you have questions about this medicine, talk to your doctor, pharmacist, or health care provider.  2023 [employer]/Gold Standard (2021-06-28 00:00:00) Methotrexate Injection What is this medication? METHOTREXATE (METH oh TREX ate) treats inflammatory conditions such as arthritis and psoriasis. It works by decreasing inflammation, which can reduce pain and prevent long-term injury to the joints and skin. It may also be used to treat some types of cancer. It works by slowing down the growth of cancer cells. This medicine may be used for other purposes; ask your health care provider or pharmacist if you have questions. What should I tell my care team before I take this medication? They need to know if you have any of these conditions: Fluid in the stomach area or lungs If you often drink alcohol Infection or immune  system problems Kidney disease Liver disease Low blood counts (white cells, platelets, or red blood cells) Lung disease Recent or ongoing radiation Recent or upcoming vaccine Stomach ulcers Ulcerative colitis An unusual or allergic reaction to methotrexate, other medications, foods, dyes, or preservatives Pregnant or trying to get pregnant Breast-feeding How should I use this medication? This medication is for infusion into a vein or for injection into muscle or into the spinal fluid (whichever applies). It is usually given in a hospital or clinic setting. In rare cases, you might get this medication at home. You will be taught how to give this medication. Use exactly as directed. Take your medication at regular intervals. Do not take your medication more often than directed. If this medication is used for arthritis or psoriasis, it should be taken weekly, NOT daily. It is important that you put your used needles and syringes in a special sharps container. Do not put them in a trash can. If you do not have a sharps container, call your pharmacist or care team to get one. Talk to your care team about the use of this medication in children. While this medication may be prescribed for children as young as 2 years for selected conditions, precautions do apply. Overdosage: If you  think you have taken too much of this medicine contact a poison control center or emergency room at once. NOTE: This medicine is only for you. Do not share this medicine with others. What if I miss a dose? It is important not to miss your dose. Call your care team if you are unable to keep an appointment. If you give yourself the medication, and you miss a dose, talk with your care team. Do not take double or extra doses. What may interact with this medication? Do not take this medication with any of the following: Acitretin This medication may also interact with the following: Aspirin or aspirin-like medications  including salicylates Azathioprine Certain antibiotics like chloramphenicol, penicillin, tetracycline Certain medications that treat or prevent blood clots like warfarin, apixaban, dabigatran, and rivaroxaban Certain medications for stomach problems like esomeprazole, omeprazole, pantoprazole Cyclosporine Dapsone Diuretics Folic acid Gold Hydroxychloroquine Live virus vaccines Medications for infection like acyclovir, adefovir, amphotericin B, bacitracin, cidofovir, foscarnet, ganciclovir, gentamicin, pentamidine, vancomycin Mercaptopurine NSAIDs, medications for pain and inflammation, like ibuprofen or naproxen Pamidronate Pemetrexed Penicillamine Phenylbutazone Phenytoin Probenecid Pyrimethamine Retinoids such as isotretinoin and tretinoin Steroid medications like prednisone or cortisone Sulfonamides like sulfasalazine and trimethoprim/sulfamethoxazole Theophylline Zoledronic acid This list may not describe all possible interactions. Give your health care provider a list of all the medicines, herbs, non-prescription drugs, or dietary supplements you use. Also tell them if you smoke, drink alcohol, or use illegal drugs. Some items may interact with your medicine. What should I watch for while using this medication? This medication may make you feel generally unwell. This is not uncommon as chemotherapy can affect healthy cells as well as cancer cells. Report any side effects. Continue your course of treatment even though you feel ill unless your care team tells you to stop. Your condition will be monitored carefully while you are receiving this medication. Avoid alcoholic drinks. This medication can cause serious side effects. To reduce the risk, your care team may give you other medications to take before receiving this one. Be sure to follow the directions from your care team. This medication can make you more sensitive to the sun. Keep out of the sun. If you cannot avoid being in  the sun, wear protective clothing and use sunscreen. Do not use sun lamps or tanning beds/booths. You may get drowsy or dizzy. Do not drive, use machinery, or do anything that needs mental alertness until you know how this medication affects you. Do not stand or sit up quickly, especially if you are an older patient. This reduces the risk of dizzy or fainting spells. You may need blood work while you are taking this medication. Call your care team for advice if you get a fever, chills or sore throat, or other symptoms of a cold or flu. Do not treat yourself. This medication decreases your body's ability to fight infections. Try to avoid being around people who are sick. This medication may increase your risk to bruise or bleed. Call your care team if you notice any unusual bleeding. Be careful brushing or flossing your teeth or using a toothpick because you may get an infection or bleed more easily. If you have any dental work done, tell your dentist you are receiving this medication Check with your care team if you get an attack of severe diarrhea, nausea and vomiting, or if you sweat a lot. The loss of too much body fluid can make it dangerous for you to take this medication. Talk to your care team  about your risk of cancer. You may be more at risk for certain types of cancers if you take this medication. Do not become pregnant while taking this medication or for 6 months after stopping it. Women should inform their care team if they wish to become pregnant or think they might be pregnant. Men should not father a child while taking this medication and for 3 months after stopping it. There is potential for serious harm to an unborn child. Talk to your care team for more information. Do not breast-feed an infant while taking this medication or for 1 week after stopping it. This medication may make it more difficult to get pregnant or father a child. Talk to your care team if you are concerned about your  fertility. What side effects may I notice from receiving this medication? Side effects that you should report to your care team as soon as possible: Allergic reactions--skin rash, itching, hives, swelling of the face, lips, tongue, or throat Blood clot--pain, swelling, or warmth in the leg, shortness of breath, chest pain Dry cough, shortness of breath or trouble breathing Infection--fever, chills, cough, sore throat, wounds that don't heal, pain or trouble when passing urine, general feeling of discomfort or being unwell Kidney injury--decrease in the amount of urine, swelling of the ankles, hands, or feet Liver injury--right upper belly pain, loss of appetite, nausea, light-colored stool, dark yellow or brown urine, yellowing of the skin or eyes, unusual weakness or fatigue Low red blood cell count--unusual weakness or fatigue, dizziness, headache, trouble breathing Redness, blistering, peeling, or loosening of the skin, including inside the mouth Seizures Unusual bruising or bleeding Side effects that usually do not require medical attention (report to your care team if they continue or are bothersome): Diarrhea Dizziness Hair loss Nausea Pain, redness, or swelling with sores inside the mouth or throat Vomiting This list may not describe all possible side effects. Call your doctor for medical advice about side effects. You may report side effects to FDA at 1-800-FDA-1088. Where should I keep my medication? This medication is given in a hospital or clinic. It will not be stored at home. NOTE: This sheet is a summary. It may not cover all possible information. If you have questions about this medicine, talk to your doctor, pharmacist, or health care provider.  2023 [employer] Standard (2020-04-26 00:00:00)

## 2021-12-05 DIAGNOSIS — H43812 Vitreous degeneration, left eye: Secondary | ICD-10-CM | POA: Diagnosis not present

## 2021-12-15 NOTE — Progress Notes (Signed)
[facility]  [address] [place],  [place]  [address] [phone]  Clinic Day:  12/20/2021  Referring physician: [name], MD  ASSESSMENT & PLAN:   Stage IIB left breast cancer This is a grade 3 infiltrating ductal carcinoma with a T2 N0 M0 measuring 28 mm with 2 negative nodes.  She also has high-grade ductal carcinoma in situ.  Margins are clear.  Estrogen receptors positive at 30% and progesterone receptors are negative with HER2 negative and a Ki-67 of 60%.  I feel she is at higher risk for recurrence and discussed chemotherapy such as TC for 4 cycles.  However we have to take into consideration her advanced age of 67 and whether she would tolerate treatment.  Her EndoPredict score came back at 5.2, correlating with a 48% risk of distant recurrence in the next 10 years. She would have 24% benefit with chemotherapy. She decided to proceed with CMF chemotherapy and that was started 09/29/2021.    Osteopenia Bone density scan was done in September 2022 and reveals osteopenia of the hip with a T score of -2.0.  The spine readings were normal.  She was offered Fosamax but declined.  Large seroma/hematoma of the left breast This is stable.  Positive family history for breast cancer She has 2 maternal aunts and a maternal cousin as well as a first paternal cousin all with breast cancer, primarily in their 82s and 72s.  I discussed the option of genetic testing but she seems not inclined to pursue this since she has no children of her own and is 65 years old, so it would not likely change her management.  Neutropenia This resulted in a delay after her first cycle. Her white count has done well since the 20% dose reduction.  Mild thrombocytopenia  This is new and very mild, so will probably not be significant. We will monitor.    Plan: We will see her back in 3 weeks for her treatment and reevaluation with CBC and CMP.  I discussed the  assessment and treatment plan with the patient.  The patient was provided an opportunity to ask questions and all were answered.  The patient agreed with the plan and demonstrated an understanding of the instructions.  The patient was advised to call back if the symptoms worsen or if the condition fails to improve as anticipated.          [name], MD [facility] [address] [place] [address] Dept: [phone] Dept Fax: [phone]   Orders Placed This Encounter  Procedures   CBC and differential    This external order was created through the Results Console.   CBC    This external order was created through the Results Console.   Basic metabolic panel    This external order was created through the Results Console.   Comprehensive metabolic panel    This external order was created through the Results Console.   Lipid panel    This external order was created through the Results Console.   Hepatic function panel    This external order was created through the Results Console.      CHIEF COMPLAINT:  CC: Stage IIB hormone receptor positive breast cancer  Current Treatment: Adjuvant cyclophosphamide/methotrexate/5-fluorouracil every 3 weeks  HISTORY OF PRESENT ILLNESS:  Ashley Villa 81 y.o. female is here because of recent diagnosis of left breast carcinoma. The cancer  was detected by screening mammogram on March 6 which showed a possible mass. The cancer was not palpable prior to diagnosis.  She was sent for diagnostic mammogram on March 23 and this confirmed a persistent mass in the slightly upper left breast, middle depth.  An ultrasound confirmed a 1.6 cm irregular hypoechoic mass at 12:00, 2 cm from the nipple.  She then had a biopsy performed on March 29 which revealed invasive ductal carcinoma.  Estrogen receptors were positive at 30% with progesterone receptors negative and HER2 negative  although it did show 1+ on immuno histochemistry.  Ki 67 with 60%.  She was referred to Dr. Jerel Shepherd who performed a lumpectomy on May 10.  The final pathology revealed a grade 3 invasive ductal carcinoma with high-grade ductal carcinoma in situ.  Margins were clear and 2 sentinel nodes were negative.  The final measurement was 28 mm for a T2 N0 M0.  She does have yearly mammograms.   I reviewed her records extensively and collaborated the history with the patient. Oncology History  Breast cancer of upper-outer quadrant of left female breast ([place])  06/01/2021 Initial Diagnosis   Breast cancer of upper-outer quadrant of left female breast ([place])   07/13/2021 Cancer Staging   Staging form: Breast, AJCC 8th Edition - Clinical stage from 07/13/2021: Stage IIB (cT2, cN0(sn), cM0, G3, ER+, PR-, HER2-) - Signed by [name], MD on 08/10/2021 Histopathologic type: Infiltrating duct carcinoma, NOS Stage prefix: Initial diagnosis Method of lymph node assessment: Sentinel lymph node biopsy Nuclear grade: G3 Histologic grading system: 3 grade system Laterality: Left Tumor size (mm): 28 Lymph-vascular invasion (LVI): LVI not present (absent)/not identified Diagnostic confirmation: Positive histology Specimen type: Excision Staged by: Managing physician Percentage of positive estrogen receptors (%): 30 Percentage of positive progesterone receptors (%): 0 HER2-IHC interpretation: Negative HER2-IHC value: Score 1+ Menopausal status: Postmenopausal Ki-67 (%): 60 Stage used in treatment planning: Yes National guidelines used in treatment planning: Yes Type of national guideline used in treatment planning: NCCN   09/29/2021 - 10/27/2021 Chemotherapy   Patient is on Treatment Plan : BREAST Adjuvant CMF IV q21d     09/29/2021 -  Chemotherapy   Patient is on Treatment Plan : BREAST Adjuvant CMF IV q21d         INTERVAL HISTORY:  Ashley Villa is here today for a routine follow up and states  she is feeling okay. She will be receiving her fourth cycle of treatment next. Platelets are slightly low at 125,000. Otherwise her CBC and CMP are normal. Her dentist appointment is Oct 25 and she will take antibiotics for prophylaxis. I have suggested that if she would like to get her flu shot and Covid vaccine, to take it a few days after her treatment. She was disappointed that she will be getting 8 cycles instead of 6 for her treatment. She had misunderstood 6 cycles for 6 months. Her appetite is well and she has lost 2 pounds since her last visit. She denies any gastrointestinal or cardiorespiratory symptoms.  She denies fever, chills or other signs of infection. She denies nausea, vomiting, bowel issues, or abdominal pain.  She denies sore throat, cough, dyspnea, or chest pain.  REVIEW OF SYSTEMS:  Review of Systems  Constitutional:  Negative for appetite change, chills, fatigue, fever and unexpected weight change.  HENT:   Negative for lump/mass, mouth sores and sore throat.   Respiratory:  Negative for cough and shortness of breath.   Cardiovascular:  Negative for chest  pain and leg swelling.  Gastrointestinal:  Negative for abdominal pain, constipation, diarrhea, nausea and vomiting.  Endocrine: Negative for hot flashes.  Genitourinary:  Negative for difficulty urinating, dysuria, frequency and hematuria.   Musculoskeletal:  Negative for arthralgias, back pain and myalgias.  Skin:  Negative for rash.  Neurological:  Negative for dizziness and headaches.  Hematological:  Negative for adenopathy. Does not bruise/bleed easily.  Psychiatric/Behavioral:  Negative for depression and sleep disturbance. The patient is not nervous/anxious.      VITALS:  Blood pressure (!) 162/89, pulse 60, resp. rate 18, height '5\' 4"'  (1.626 m), weight 135 lb 14.4 oz (61.6 kg), SpO2 95 %.  Wt Readings from Last 3 Encounters:  01/10/22 135 lb 9.6 oz (61.5 kg)  12/22/21 136 lb 1.3 oz (61.7 kg)  12/20/21 135 lb  14.4 oz (61.6 kg)    Body mass index is 23.33 kg/m.  Performance status (ECOG): 0 - Asymptomatic  PHYSICAL EXAM:  Physical Exam Vitals and nursing note reviewed.  Constitutional:      General: She is not in acute distress.    Appearance: Normal appearance.  HENT:     Head: Normocephalic and atraumatic.     Mouth/Throat:     Mouth: Mucous membranes are moist.     Pharynx: Oropharynx is clear. No oropharyngeal exudate or posterior oropharyngeal erythema.  Eyes:     General: No scleral icterus.    Extraocular Movements: Extraocular movements intact.     Conjunctiva/sclera: Conjunctivae normal.     Pupils: Pupils are equal, round, and reactive to light.  Cardiovascular:     Rate and Rhythm: Normal rate and regular rhythm.     Pulses: Normal pulses.     Heart sounds: Normal heart sounds. No murmur heard.    No friction rub. No gallop.  Pulmonary:     Effort: Pulmonary effort is normal.     Breath sounds: Normal breath sounds. No wheezing, rhonchi or rales.  Chest:     Comments: Firm seroma in the chest measuring 4 cm, A well healed scar in the upper inner quadrant. Healed left axillary scar. Faded scar in the upper outer breast.  Abdominal:     General: There is no distension.     Palpations: Abdomen is soft. There is no hepatomegaly, splenomegaly or mass.     Tenderness: There is no abdominal tenderness.  Musculoskeletal:        General: Normal range of motion.     Cervical back: Normal range of motion and neck supple. No tenderness.     Right lower leg: No edema.     Left lower leg: No edema.  Lymphadenopathy:     Cervical: No cervical adenopathy.     Upper Body:     Right upper body: No supraclavicular or axillary adenopathy.     Left upper body: No supraclavicular or axillary adenopathy.     Lower Body: No right inguinal adenopathy. No left inguinal adenopathy.  Skin:    General: Skin is warm and dry.     Coloration: Skin is not jaundiced.     Findings: No rash.   Neurological:     Mental Status: She is alert and oriented to person, place, and time.     Cranial Nerves: No cranial nerve deficit.  Psychiatric:        Mood and Affect: Mood normal.        Behavior: Behavior normal.        Thought Content: Thought content normal.  LABS:      Latest Ref Rng & Units 01/10/2022   10:57 AM 12/20/2021   12:00 AM 11/29/2021   12:00 AM  CBC  WBC 4.0 - 10.5 K/uL 3.5  3.8     5.3      Hemoglobin 12.0 - 15.0 g/dL 13.5  13.7     14.1      Hematocrit 36.0 - 46.0 % 42.5  41     42      Platelets 150 - 400 K/uL 129  125     124         This result is from an external source.      Latest Ref Rng & Units 01/10/2022   10:57 AM 12/20/2021   12:00 AM 11/29/2021   12:00 AM  CMP  Glucose 70 - 99 mg/dL 119     BUN 8 - 23 mg/dL '21  18     22      ' Creatinine 0.44 - 1.00 mg/dL 0.90  0.8     0.8      Sodium 135 - 145 mmol/L 145  136     140      Potassium 3.5 - 5.1 mmol/L 3.9  4.1     3.6      Chloride 98 - 111 mmol/L 104  105     106      CO2 22 - 32 mmol/L '29  31     29      ' Calcium 8.9 - 10.3 mg/dL 9.6  9.2     9.1      Total Protein 6.5 - 8.1 g/dL 6.3     Total Bilirubin 0.3 - 1.2 mg/dL 1.3     Alkaline Phos 38 - 126 U/L 71  78     79      AST 15 - 41 U/L 32  38     39      ALT 0 - 44 U/L '27  27     31         ' This result is from an external source.     No results found for: "CEA1", "CEA" / No results found for: "CEA1", "CEA" No results found for: "PSA1" No results found for: "HQP591" No results found for: "CAN125"  No results found for: "TOTALPROTELP", "ALBUMINELP", "A1GS", "A2GS", "BETS", "BETA2SER", "GAMS", "MSPIKE", "SPEI" No results found for: "TIBC", "FERRITIN", "IRONPCTSAT" No results found for: "LDH"  STUDIES:  No results found.  EXAM:05/26/21 DIGITAL DIAGNOSTIC UNILATERAL LEFT MAMMOGRAM IMPRESSION: Highly suspicious 1.6 cm upper left breast mass. Tissue sampling is recommending. No abnormal appearing Left axillary lymph nodes.   HISTORY:   Past Medical History:  Diagnosis Date   H/O mitral valve repair    HTN (hypertension)    Status post left breast lumpectomy     Past Surgical History:  Procedure Laterality Date   TONSILLECTOMY      Family History  Problem Relation Age of Onset   Cancer Mother    Cancer Maternal Aunt     Social History:  reports that she has never smoked. She has never used smokeless tobacco. She reports that she does not drink alcohol and does not use drugs.The patient is accompanied by her husband today.  Allergies: No Known Allergies  Current Medications: Current Outpatient Medications  Medication Sig Dispense Refill   amoxicillin (AMOXIL) 500 MG capsule Prn prior to dentist visit with hx of mitral valve prolapse (Patient not taking: Reported on 11/29/2021)  Calcium Citrate-Vitamin D 315-5 MG-MCG TABS Take 1 tablet by mouth daily.     Cholecalciferol 25 MCG (1000 UT) capsule Take by mouth.     Coenzyme Q10 50 MG CAPS Take by mouth.     docusate sodium (COLACE) 100 MG capsule Take 100 mg by mouth 2 (two) times daily.     glucosamine-chondroitin 500-400 MG tablet Take 1 tablet by mouth daily.     levocetirizine (XYZAL) 5 MG tablet Take 5 mg by mouth daily.     losartan (COZAAR) 100 MG tablet Take 100 mg by mouth daily.     metoprolol tartrate (LOPRESSOR) 25 MG tablet Take by mouth.     multivitamin-lutein (OCUVITE-LUTEIN) CAPS capsule Take 1 capsule by mouth daily.     ondansetron (ZOFRAN) 8 MG tablet Take 1 tablet (8 mg total) by mouth 2 (two) times daily as needed for refractory nausea / vomiting (Start on day 3 after chemotherapy). 30 tablet 1   prochlorperazine (COMPAZINE) 10 MG tablet Take 1 tablet (10 mg total) by mouth every 6 (six) hours as needed for nausea or vomiting. 30 tablet 1   simvastatin (ZOCOR) 20 MG tablet Take 20 mg by mouth at bedtime.     No current facility-administered medications for this visit.         I,Gabriella Ballesteros,acting as a scribe  for [name], MD.,have documented all relevant documentation on the behalf of [name], MD,as directed by  [name], MD while in the presence of [name], MD.

## 2021-12-16 ENCOUNTER — Other Ambulatory Visit: Payer: Self-pay | Admitting: Oncology

## 2021-12-16 DIAGNOSIS — Z170 Estrogen receptor positive status [ER+]: Secondary | ICD-10-CM

## 2021-12-19 DIAGNOSIS — N1831 Chronic kidney disease, stage 3a: Secondary | ICD-10-CM | POA: Diagnosis not present

## 2021-12-19 DIAGNOSIS — C50912 Malignant neoplasm of unspecified site of left female breast: Secondary | ICD-10-CM | POA: Diagnosis not present

## 2021-12-19 DIAGNOSIS — D696 Thrombocytopenia, unspecified: Secondary | ICD-10-CM | POA: Diagnosis not present

## 2021-12-19 DIAGNOSIS — Z0000 Encounter for general adult medical examination without abnormal findings: Secondary | ICD-10-CM | POA: Diagnosis not present

## 2021-12-19 DIAGNOSIS — E7800 Pure hypercholesterolemia, unspecified: Secondary | ICD-10-CM | POA: Diagnosis not present

## 2021-12-19 DIAGNOSIS — Z6823 Body mass index (BMI) 23.0-23.9, adult: Secondary | ICD-10-CM | POA: Diagnosis not present

## 2021-12-19 DIAGNOSIS — I4891 Unspecified atrial fibrillation: Secondary | ICD-10-CM | POA: Diagnosis not present

## 2021-12-19 DIAGNOSIS — I10 Essential (primary) hypertension: Secondary | ICD-10-CM | POA: Diagnosis not present

## 2021-12-19 DIAGNOSIS — Z2821 Immunization not carried out because of patient refusal: Secondary | ICD-10-CM | POA: Diagnosis not present

## 2021-12-20 ENCOUNTER — Inpatient Hospital Stay: Payer: Medicare HMO

## 2021-12-20 ENCOUNTER — Encounter: Payer: Self-pay | Admitting: Oncology

## 2021-12-20 ENCOUNTER — Inpatient Hospital Stay: Payer: Medicare HMO | Attending: Oncology | Admitting: Oncology

## 2021-12-20 VITALS — BP 162/89 | HR 60 | Resp 18 | Ht 64.0 in | Wt 135.9 lb

## 2021-12-20 DIAGNOSIS — Z5111 Encounter for antineoplastic chemotherapy: Secondary | ICD-10-CM | POA: Insufficient documentation

## 2021-12-20 DIAGNOSIS — M8580 Other specified disorders of bone density and structure, unspecified site: Secondary | ICD-10-CM | POA: Diagnosis not present

## 2021-12-20 DIAGNOSIS — C50412 Malignant neoplasm of upper-outer quadrant of left female breast: Secondary | ICD-10-CM | POA: Diagnosis not present

## 2021-12-20 DIAGNOSIS — Z170 Estrogen receptor positive status [ER+]: Secondary | ICD-10-CM | POA: Insufficient documentation

## 2021-12-20 DIAGNOSIS — Z780 Asymptomatic menopausal state: Secondary | ICD-10-CM

## 2021-12-20 DIAGNOSIS — R7302 Impaired glucose tolerance (oral): Secondary | ICD-10-CM | POA: Diagnosis not present

## 2021-12-20 DIAGNOSIS — D649 Anemia, unspecified: Secondary | ICD-10-CM | POA: Diagnosis not present

## 2021-12-20 DIAGNOSIS — E7800 Pure hypercholesterolemia, unspecified: Secondary | ICD-10-CM | POA: Diagnosis not present

## 2021-12-20 LAB — HEPATIC FUNCTION PANEL
ALT: 27 U/L (ref 7–35)
AST: 38 — AB (ref 13–35)
Alkaline Phosphatase: 78 (ref 25–125)
Bilirubin, Total: 1.1

## 2021-12-20 LAB — CBC AND DIFFERENTIAL
HCT: 41 (ref 36–46)
Hemoglobin: 13.7 (ref 12.0–16.0)
Neutrophils Absolute: 1.94
Platelets: 125 10*3/uL — AB (ref 150–400)
WBC: 3.8

## 2021-12-20 LAB — CBC: RBC: 4.45 (ref 3.87–5.11)

## 2021-12-20 LAB — BASIC METABOLIC PANEL
BUN: 18 (ref 4–21)
CO2: 31 — AB (ref 13–22)
Chloride: 105 (ref 99–108)
Creatinine: 0.8 (ref 0.5–1.1)
Glucose: 82
Potassium: 4.1 mEq/L (ref 3.5–5.1)
Sodium: 136 — AB (ref 137–147)

## 2021-12-20 LAB — LIPID PANEL
Cholesterol: 154 (ref 0–200)
HDL: 38 (ref 35–70)
LDL Cholesterol: 90
Triglycerides: 130 (ref 40–160)

## 2021-12-20 LAB — COMPREHENSIVE METABOLIC PANEL
Albumin: 3.7 (ref 3.5–5.0)
Calcium: 9.2 (ref 8.7–10.7)

## 2021-12-20 NOTE — Progress Notes (Signed)
Face to face visit with pt and husband in [place] today. Pt is still on chemo and reports that she is doing well. She says that she gets tired sometimes and her hair is thinning but overall she is doing well.

## 2021-12-21 ENCOUNTER — Other Ambulatory Visit: Payer: Self-pay

## 2021-12-21 MED FILL — Methotrexate Sodium Inj PF 50 MG/2ML (25 MG/ML): INTRAMUSCULAR | Qty: 1.6 | Status: AC

## 2021-12-21 MED FILL — Cyclophosphamide For Inj 1 GM: INTRAMUSCULAR | Qty: 34 | Status: AC

## 2021-12-21 MED FILL — Dexamethasone Sodium Phosphate Inj 100 MG/10ML: INTRAMUSCULAR | Qty: 1 | Status: AC

## 2021-12-21 MED FILL — Fluorouracil IV Soln 2.5 GM/50ML (50 MG/ML): INTRAVENOUS | Qty: 13 | Status: AC

## 2021-12-22 ENCOUNTER — Inpatient Hospital Stay: Payer: Medicare HMO

## 2021-12-22 VITALS — BP 166/79 | HR 69 | Temp 98.1°F | Resp 18 | Ht 64.0 in | Wt 136.1 lb

## 2021-12-22 DIAGNOSIS — Z5111 Encounter for antineoplastic chemotherapy: Secondary | ICD-10-CM | POA: Diagnosis not present

## 2021-12-22 DIAGNOSIS — Z170 Estrogen receptor positive status [ER+]: Secondary | ICD-10-CM

## 2021-12-22 DIAGNOSIS — C50412 Malignant neoplasm of upper-outer quadrant of left female breast: Secondary | ICD-10-CM | POA: Diagnosis not present

## 2021-12-22 MED ORDER — HEPARIN SOD (PORK) LOCK FLUSH 100 UNIT/ML IV SOLN
500.0000 [IU] | Freq: Once | INTRAVENOUS | Status: AC | PRN
  Administered 2021-12-22: 500 [IU]

## 2021-12-22 MED ORDER — PALONOSETRON HCL INJECTION 0.25 MG/5ML
0.2500 mg | Freq: Once | INTRAVENOUS | Status: AC
  Administered 2021-12-22: 0.25 mg via INTRAVENOUS
  Filled 2021-12-22: qty 5

## 2021-12-22 MED ORDER — SODIUM CHLORIDE 0.9 % IV SOLN
10.0000 mg | Freq: Once | INTRAVENOUS | Status: AC
  Administered 2021-12-22: 10 mg via INTRAVENOUS
  Filled 2021-12-22: qty 10

## 2021-12-22 MED ORDER — METHOTREXATE SODIUM CHEMO INJECTION (PF) 50 MG/2ML
23.7500 mg/m2 | Freq: Once | INTRAMUSCULAR | Status: AC
  Administered 2021-12-22: 40 mg via INTRAVENOUS
  Filled 2021-12-22: qty 1.6

## 2021-12-22 MED ORDER — SODIUM CHLORIDE 0.9 % IV SOLN: Freq: Once | INTRAVENOUS | Status: AC

## 2021-12-22 MED ORDER — FLUOROURACIL CHEMO INJECTION 2.5 GM/50ML
400.0000 mg/m2 | Freq: Once | INTRAVENOUS | Status: AC
  Administered 2021-12-22: 650 mg via INTRAVENOUS
  Filled 2021-12-22: qty 13

## 2021-12-22 MED ORDER — SODIUM CHLORIDE 0.9% FLUSH
10.0000 mL | INTRAVENOUS | Status: DC | PRN
  Administered 2021-12-22: 10 mL

## 2021-12-22 MED ORDER — SODIUM CHLORIDE 0.9 % IV SOLN
400.0000 mg/m2 | Freq: Once | INTRAVENOUS | Status: AC
  Administered 2021-12-22: 680 mg via INTRAVENOUS
  Filled 2021-12-22: qty 34

## 2021-12-22 NOTE — Patient Instructions (Addendum)
[facility]  Discharge Instructions: Thank you for choosing [facility] to provide your oncology and hematology care.  If you have a lab appointment with the [facility], please go directly to the [facility] and check in at the registration area.   Wear comfortable clothing and clothing appropriate for easy access to any Portacath or PICC line.   We strive to give you quality time with your provider. You may need to reschedule your appointment if you arrive late (15 or more minutes).  Arriving late affects you and other patients whose appointments are after yours.  Also, if you miss three or more appointments without notifying the office, you may be dismissed from the clinic at the provider's discretion.      For prescription refill requests, have your pharmacy contact our office and allow 72 hours for refills to be completed.    Today you received the following chemotherapy and/or immunotherapy agents  I  To help prevent nausea and vomiting after your treatment, we encourage you to take your nausea medication as directed.  BELOW ARE SYMPTOMS THAT SHOULD BE REPORTED IMMEDIATELY: *FEVER GREATER THAN 100.4 F (38 C) OR HIGHER *CHILLS OR SWEATING *NAUSEA AND VOMITING THAT IS NOT CONTROLLED WITH YOUR NAUSEA MEDICATION *UNUSUAL SHORTNESS OF BREATH *UNUSUAL BRUISING OR BLEEDING *URINARY PROBLEMS (pain or burning when urinating, or frequent urination) *BOWEL PROBLEMS (unusual diarrhea, constipation, pain near the anus) TENDERNESS IN MOUTH AND THROAT WITH OR WITHOUT PRESENCE OF ULCERS (sore throat, sores in mouth, or a toothache) UNUSUAL RASH, SWELLING OR PAIN  UNUSUAL VAGINAL DISCHARGE OR ITCHING   Items with * indicate a potential emergency and should be followed up as soon as possible or go to the Emergency Department if any problems should occur.  Please show the CHEMOTHERAPY ALERT CARD or IMMUNOTHERAPY ALERT CARD at check-in to the Emergency  Department and triage nurse.  Should you have questions after your visit or need to cancel or reschedule your appointment, please contact [facility]  Dept: [phone]  and follow the prompts.  Office hours are 8:00 a.m. to 4:30 p.m. Monday - Friday. Please note that voicemails left after 4:00 p.m. may not be returned until the following business day.  We are closed weekends and major holidays. You have access to a nurse at all times for urgent questions. Please call the main number to the clinic Dept: [phone] and follow the prompts.  For any non-urgent questions, you may also contact your provider using MyChart. We now offer e-Visits for anyone 81 and older to request care online for non-urgent symptoms. For details visit [email or web address].   Also download the MyChart app! Go to the app store, search "MyChart", open the app, select [facility], and log in with your MyChart username and password.  Masks are optional in the cancer centers. If you would like for your care team to wear a mask while they are taking care of you, please let them know. You may have one support person who is at least 81 years old accompany you for your appointments. Methotrexate Injection What is this medication? METHOTREXATE (METH oh TREX ate) treats inflammatory conditions such as arthritis and psoriasis. It works by decreasing inflammation, which can reduce pain and prevent long-term injury to the joints and skin. It may also be used to treat some types of cancer. It works by slowing down the growth of cancer cells. This medicine may be used for other purposes; ask  your health care provider or pharmacist if you have questions. What should I tell my care team before I take this medication? They need to know if you have any of these conditions: Fluid in the stomach area or lungs If you often drink alcohol Infection or immune system problems Kidney disease Liver disease Low blood  counts (white cells, platelets, or red blood cells) Lung disease Recent or ongoing radiation Recent or upcoming vaccine Stomach ulcers Ulcerative colitis An unusual or allergic reaction to methotrexate, other medications, foods, dyes, or preservatives Pregnant or trying to get pregnant Breast-feeding How should I use this medication? This medication is for infusion into a vein or for injection into muscle or into the spinal fluid (whichever applies). It is usually given in a hospital or clinic setting. In rare cases, you might get this medication at home. You will be taught how to give this medication. Use exactly as directed. Take your medication at regular intervals. Do not take your medication more often than directed. If this medication is used for arthritis or psoriasis, it should be taken weekly, NOT daily. It is important that you put your used needles and syringes in a special sharps container. Do not put them in a trash can. If you do not have a sharps container, call your pharmacist or care team to get one. Talk to your care team about the use of this medication in children. While this medication may be prescribed for children as young as 2 years for selected conditions, precautions do apply. Overdosage: If you think you have taken too much of this medicine contact a poison control center or emergency room at once. NOTE: This medicine is only for you. Do not share this medicine with others. What if I miss a dose? It is important not to miss your dose. Call your care team if you are unable to keep an appointment. If you give yourself the medication, and you miss a dose, talk with your care team. Do not take double or extra doses. What may interact with this medication? Do not take this medication with any of the following: Acitretin This medication may also interact with the following: Aspirin or aspirin-like medications including salicylates Azathioprine Certain antibiotics like  chloramphenicol, penicillin, tetracycline Certain medications that treat or prevent blood clots like warfarin, apixaban, dabigatran, and rivaroxaban Certain medications for stomach problems like esomeprazole, omeprazole, pantoprazole Cyclosporine Dapsone Diuretics Folic acid Gold Hydroxychloroquine Live virus vaccines Medications for infection like acyclovir, adefovir, amphotericin B, bacitracin, cidofovir, foscarnet, ganciclovir, gentamicin, pentamidine, vancomycin Mercaptopurine NSAIDs, medications for pain and inflammation, like ibuprofen or naproxen Pamidronate Pemetrexed Penicillamine Phenylbutazone Phenytoin Probenecid Pyrimethamine Retinoids such as isotretinoin and tretinoin Steroid medications like prednisone or cortisone Sulfonamides like sulfasalazine and trimethoprim/sulfamethoxazole Theophylline Zoledronic acid This list may not describe all possible interactions. Give your health care provider a list of all the medicines, herbs, non-prescription drugs, or dietary supplements you use. Also tell them if you smoke, drink alcohol, or use illegal drugs. Some items may interact with your medicine. What should I watch for while using this medication? This medication may make you feel generally unwell. This is not uncommon as chemotherapy can affect healthy cells as well as cancer cells. Report any side effects. Continue your course of treatment even though you feel ill unless your care team tells you to stop. Your condition will be monitored carefully while you are receiving this medication. Avoid alcoholic drinks. This medication can cause serious side effects. To reduce the risk, your care  team may give you other medications to take before receiving this one. Be sure to follow the directions from your care team. This medication can make you more sensitive to the sun. Keep out of the sun. If you cannot avoid being in the sun, wear protective clothing and use sunscreen. Do not  use sun lamps or tanning beds/booths. You may get drowsy or dizzy. Do not drive, use machinery, or do anything that needs mental alertness until you know how this medication affects you. Do not stand or sit up quickly, especially if you are an older patient. This reduces the risk of dizzy or fainting spells. You may need blood work while you are taking this medication. Call your care team for advice if you get a fever, chills or sore throat, or other symptoms of a cold or flu. Do not treat yourself. This medication decreases your body's ability to fight infections. Try to avoid being around people who are sick. This medication may increase your risk to bruise or bleed. Call your care team if you notice any unusual bleeding. Be careful brushing or flossing your teeth or using a toothpick because you may get an infection or bleed more easily. If you have any dental work done, tell your dentist you are receiving this medication Check with your care team if you get an attack of severe diarrhea, nausea and vomiting, or if you sweat a lot. The loss of too much body fluid can make it dangerous for you to take this medication. Talk to your care team about your risk of cancer. You may be more at risk for certain types of cancers if you take this medication. Do not become pregnant while taking this medication or for 6 months after stopping it. Women should inform their care team if they wish to become pregnant or think they might be pregnant. Men should not father a child while taking this medication and for 3 months after stopping it. There is potential for serious harm to an unborn child. Talk to your care team for more information. Do not breast-feed an infant while taking this medication or for 1 week after stopping it. This medication may make it more difficult to get pregnant or father a child. Talk to your care team if you are concerned about your fertility. What side effects may I notice from receiving this  medication? Side effects that you should report to your care team as soon as possible: Allergic reactions--skin rash, itching, hives, swelling of the face, lips, tongue, or throat Blood clot--pain, swelling, or warmth in the leg, shortness of breath, chest pain Dry cough, shortness of breath or trouble breathing Infection--fever, chills, cough, sore throat, wounds that don't heal, pain or trouble when passing urine, general feeling of discomfort or being unwell Kidney injury--decrease in the amount of urine, swelling of the ankles, hands, or feet Liver injury--right upper belly pain, loss of appetite, nausea, light-colored stool, dark yellow or brown urine, yellowing of the skin or eyes, unusual weakness or fatigue Low red blood cell count--unusual weakness or fatigue, dizziness, headache, trouble breathing Redness, blistering, peeling, or loosening of the skin, including inside the mouth Seizures Unusual bruising or bleeding Side effects that usually do not require medical attention (report to your care team if they continue or are bothersome): Diarrhea Dizziness Hair loss Nausea Pain, redness, or swelling with sores inside the mouth or throat Vomiting This list may not describe all possible side effects. Call your doctor for medical advice about side  effects. You may report side effects to FDA at 1-800-FDA-1088. Where should I keep my medication? This medication is given in a hospital or clinic. It will not be stored at home. NOTE: This sheet is a summary. It may not cover all possible information. If you have questions about this medicine, talk to your doctor, pharmacist, or health care provider.  2023 Elsevier/Gold Standard (2020-04-26 00:00:00) Fluorouracil Injection What is this medication? FLUOROURACIL (flure oh YOOR a sil) treats some types of cancer. It works by slowing down the growth of cancer cells. This medicine may be used for other purposes; ask your health care provider or  pharmacist if you have questions. COMMON BRAND NAME(S): Adrucil What should I tell my care team before I take this medication? They need to know if you have any of these conditions: Blood disorders Dihydropyrimidine dehydrogenase (DPD) deficiency Infection, such as chickenpox, cold sores, herpes Kidney disease Liver disease Poor nutrition Recent or ongoing radiation therapy An unusual or allergic reaction to fluorouracil, other medications, foods, dyes, or preservatives If you or your partner are pregnant or trying to get pregnant Breast-feeding How should I use this medication? This medication is injected into a vein. It is administered by your care team in a hospital or clinic setting. Talk to your care team about the use of this medication in children. Special care may be needed. Overdosage: If you think you have taken too much of this medicine contact a poison control center or emergency room at once. NOTE: This medicine is only for you. Do not share this medicine with others. What if I miss a dose? Keep appointments for follow-up doses. It is important not to miss your dose. Call your care team if you are unable to keep an appointment. What may interact with this medication? Do not take this medication with any of the following: Live virus vaccines This medication may also interact with the following: Medications that treat or prevent blood clots, such as warfarin, enoxaparin, dalteparin This list may not describe all possible interactions. Give your health care provider a list of all the medicines, herbs, non-prescription drugs, or dietary supplements you use. Also tell them if you smoke, drink alcohol, or use illegal drugs. Some items may interact with your medicine. What should I watch for while using this medication? Your condition will be monitored carefully while you are receiving this medication. This medication may make you feel generally unwell. This is not uncommon as  chemotherapy can affect healthy cells as well as cancer cells. Report any side effects. Continue your course of treatment even though you feel ill unless your care team tells you to stop. In some cases, you may be given additional medications to help with side effects. Follow all directions for their use. This medication may increase your risk of getting an infection. Call your care team for advice if you get a fever, chills, sore throat, or other symptoms of a cold or flu. Do not treat yourself. Try to avoid being around people who are sick. This medication may increase your risk to bruise or bleed. Call your care team if you notice any unusual bleeding. Be careful brushing or flossing your teeth or using a toothpick because you may get an infection or bleed more easily. If you have any dental work done, tell your dentist you are receiving this medication. Avoid taking medications that contain aspirin, acetaminophen, ibuprofen, naproxen, or ketoprofen unless instructed by your care team. These medications may hide a fever. Do not  treat diarrhea with over the counter products. Contact your care team if you have diarrhea that lasts more than 2 days or if it is severe and watery. This medication can make you more sensitive to the sun. Keep out of the sun. If you cannot avoid being in the sun, wear protective clothing and sunscreen. Do not use sun lamps, tanning beds, or tanning booths. Talk to your care team if you or your partner wish to become pregnant or think you might be pregnant. This medication can cause serious birth defects if taken during pregnancy and for 3 months after the last dose. A reliable form of contraception is recommended while taking this medication and for 3 months after the last dose. Talk to your care team about effective forms of contraception. Do not father a child while taking this medication and for 3 months after the last dose. Use a condom while having sex during this time  period. Do not breastfeed while taking this medication. This medication may cause infertility. Talk to your care team if you are concerned about your fertility. What side effects may I notice from receiving this medication? Side effects that you should report to your care team as soon as possible: Allergic reactions--skin rash, itching, hives, swelling of the face, lips, tongue, or throat Heart attack--pain or tightness in the chest, shoulders, arms, or jaw, nausea, shortness of breath, cold or clammy skin, feeling faint or lightheaded Heart failure--shortness of breath, swelling of the ankles, feet, or hands, sudden weight gain, unusual weakness or fatigue Heart rhythm changes--fast or irregular heartbeat, dizziness, feeling faint or lightheaded, chest pain, trouble breathing High ammonia level--unusual weakness or fatigue, confusion, loss of appetite, nausea, vomiting, seizures Infection--fever, chills, cough, sore throat, wounds that don't heal, pain or trouble when passing urine, general feeling of discomfort or being unwell Low red blood cell level--unusual weakness or fatigue, dizziness, headache, trouble breathing Pain, tingling, or numbness in the hands or feet, muscle weakness, change in vision, confusion or trouble speaking, loss of balance or coordination, trouble walking, seizures Redness, swelling, and blistering of the skin over hands and feet Severe or prolonged diarrhea Unusual bruising or bleeding Side effects that usually do not require medical attention (report to your care team if they continue or are bothersome): Dry skin Headache Increased tears Nausea Pain, redness, or swelling with sores inside the mouth or throat Sensitivity to light Vomiting This list may not describe all possible side effects. Call your doctor for medical advice about side effects. You may report side effects to FDA at 1-800-FDA-1088. Where should I keep my medication? This medication is given in  a hospital or clinic. It will not be stored at home. NOTE: This sheet is a summary. It may not cover all possible information. If you have questions about this medicine, talk to your doctor, pharmacist, or health care provider.  2023 Elsevier/Gold Standard (2021-06-28 00:00:00) Cyclophosphamide Injection What is this medication? CYCLOPHOSPHAMIDE (sye kloe FOSS fa mide) treats some types of cancer. It works by slowing down the growth of cancer cells. This medicine may be used for other purposes; ask your health care provider or pharmacist if you have questions. COMMON BRAND NAME(S): Cyclophosphamide, Cytoxan, Neosar What should I tell my care team before I take this medication? They need to know if you have any of these conditions: Heart disease Irregular heartbeat or rhythm Infection Kidney problems Liver disease Low blood cell levels (white cells, platelets, or red blood cells) Lung disease Previous radiation Trouble passing  urine An unusual or allergic reaction to cyclophosphamide, other medications, foods, dyes, or preservatives Pregnant or trying to get pregnant Breast-feeding How should I use this medication? This medication is injected into a vein. It is given by your care team in a hospital or clinic setting. Talk to your care team about the use of this medication in children. Special care may be needed. Overdosage: If you think you have taken too much of this medicine contact a poison control center or emergency room at once. NOTE: This medicine is only for you. Do not share this medicine with others. What if I miss a dose? Keep appointments for follow-up doses. It is important not to miss your dose. Call your care team if you are unable to keep an appointment. What may interact with this medication? Amphotericin B Amiodarone Azathioprine Certain antivirals for HIV or hepatitis Certain medications for blood pressure, such as enalapril, lisinopril,  quinapril Cyclosporine Diuretics Etanercept Indomethacin Medications that relax muscles Metronidazole Natalizumab Tamoxifen Warfarin This list may not describe all possible interactions. Give your health care provider a list of all the medicines, herbs, non-prescription drugs, or dietary supplements you use. Also tell them if you smoke, drink alcohol, or use illegal drugs. Some items may interact with your medicine. What should I watch for while using this medication? This medication may make you feel generally unwell. This is not uncommon as chemotherapy can affect healthy cells as well as cancer cells. Report any side effects. Continue your course of treatment even though you feel ill unless your care team tells you to stop. You may need blood work while you are taking this medication. This medication may increase your risk of getting an infection. Call your care team for advice if you get a fever, chills, sore throat, or other symptoms of a cold or flu. Do not treat yourself. Try to avoid being around people who are sick. Avoid taking medications that contain aspirin, acetaminophen, ibuprofen, naproxen, or ketoprofen unless instructed by your care team. These medications may hide a fever. Be careful brushing or flossing your teeth or using a toothpick because you may get an infection or bleed more easily. If you have any dental work done, tell your dentist you are receiving this medication. Drink water or other fluids as directed. Urinate often, even at night. Some products may contain alcohol. Ask your care team if this medication contains alcohol. Be sure to tell all care teams you are taking this medicine. Certain medicines, like metronidazole and disulfiram, can cause an unpleasant reaction when taken with alcohol. The reaction includes flushing, headache, nausea, vomiting, sweating, and increased thirst. The reaction can last from 30 minutes to several hours. Talk to your care team if you  wish to become pregnant or think you might be pregnant. This medication can cause serious birth defects if taken during pregnancy and for 1 year after the last dose. A negative pregnancy test is required before starting this medication. A reliable form of contraception is recommended while taking this medication and for 1 year after the last dose. Talk to your care team about reliable forms of contraception. Do not father a child while taking this medication and for 4 months after the last dose. Use a condom during this time period. Do not breast-feed while taking this medication or for 1 week after the last dose. This medication may cause infertility. Talk to your care team if you are concerned about your fertility. Talk to your care team about your risk  of cancer. You may be more at risk for certain types of cancer if you take this medication. What side effects may I notice from receiving this medication? Side effects that you should report to your care team as soon as possible: Allergic reactions--skin rash, itching, hives, swelling of the face, lips, tongue, or throat Dry cough, shortness of breath or trouble breathing Heart failure--shortness of breath, swelling of the ankles, feet, or hands, sudden weight gain, unusual weakness or fatigue Heart muscle inflammation--unusual weakness or fatigue, shortness of breath, chest pain, fast or irregular heartbeat, dizziness, swelling of the ankles, feet, or hands Heart rhythm changes--fast or irregular heartbeat, dizziness, feeling faint or lightheaded, chest pain, trouble breathing Infection--fever, chills, cough, sore throat, wounds that don't heal, pain or trouble when passing urine, general feeling of discomfort or being unwell Kidney injury--decrease in the amount of urine, swelling of the ankles, hands, or feet Liver injury--right upper belly pain, loss of appetite, nausea, light-colored stool, dark yellow or brown urine, yellowing skin or eyes,  unusual weakness or fatigue Low red blood cell level--unusual weakness or fatigue, dizziness, headache, trouble breathing Low sodium level--muscle weakness, fatigue, dizziness, headache, confusion Red or dark brown urine Unusual bruising or bleeding Side effects that usually do not require medical attention (report to your care team if they continue or are bothersome): Hair loss Irregular menstrual cycles or spotting Loss of appetite Nausea Pain, redness, or swelling with sores inside the mouth or throat Vomiting This list may not describe all possible side effects. Call your doctor for medical advice about side effects. You may report side effects to FDA at 1-800-FDA-1088. Where should I keep my medication? This medication is given in a hospital or clinic. It will not be stored at home. NOTE: This sheet is a summary. It may not cover all possible information. If you have questions about this medicine, talk to your doctor, pharmacist, or health care provider.  2023 Elsevier/Gold Standard (2021-06-01 00:00:00)

## 2021-12-23 ENCOUNTER — Encounter: Payer: Self-pay | Admitting: Oncology

## 2022-01-09 NOTE — Progress Notes (Signed)
[facility]  [address] 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  Pathology revealed a 28 mm with 2 negative nodes in the background of high-grade ductal carcinoma in situ.  Margins were clear.  Estrogen receptors positive at 30% and progesterone receptors are negative and HER2 negative.  Ki-67 was 60%.    She was felt to be at higher risk for recurrence, so we discussed chemotherapy such as TC for 4 cycles.  However, we would have to take into consideration her advanced age of 87.  Her EndoPredict score then came back at 5.2, correlating with a 48% risk of distant recurrence in the next 10 years. She would have 24% benefit with chemotherapy.  It was decided to give her adjuvant CMF chemotherapy for 8 cycles, which is an older and usually a more easily tolerated regimen.  She had persistent cytopenias with her first cycle of CMF, so her second cycle was delayed a week and doses were reduced.  Her third cycle was delayed due to [place] exposure.  White blood cell count is normal and her platelets are fairly stable.  She now has borderline elevation of the bilirubin, which we will continue to monitor.  She will proceed with a 5th cycle of CMF this week. We will plan to see her back in 3 weeks prior to a 6th cycle.   The patient understands the plans discussed today and is in agreement with them.  She knows to contact our office if she develops concerns prior to her next appointment.    Ashley Pickles, PA-C  [facility] [address] [place] [address] Dept: [phone] Dept Fax: [phone]   No orders of the defined types were placed in this encounter.      CHIEF COMPLAINT:  CC: Stage IIB hormone receptor positive breast cancer  Current Treatment:  Adjuvant CMF chemotherapy every 3 weeks  HISTORY OF PRESENT ILLNESS:   Oncology History  Breast cancer of upper-outer quadrant of left female breast ([place])  06/01/2021 Initial Diagnosis   Breast cancer of upper-outer quadrant of left female breast ([place])   07/13/2021 Cancer Staging   Staging form: Breast, AJCC 8th Edition - Clinical stage from 07/13/2021: Stage IIB (cT2, cN0(sn), cM0, G3, ER+, PR-, HER2-) - Signed by [name], MD on 08/10/2021 Histopathologic type: Infiltrating duct carcinoma, NOS Stage prefix: Initial diagnosis Method of lymph node assessment: Sentinel lymph node biopsy Nuclear grade: G3 Histologic grading system: 3 grade system Laterality: Left Tumor size (mm): 28 Lymph-vascular invasion (LVI): LVI not present (absent)/not identified Diagnostic confirmation: Positive histology Specimen type: Excision Staged by: Managing physician Percentage of positive estrogen receptors (%): 30 Percentage of positive progesterone receptors (%): 0 HER2-IHC interpretation: Negative HER2-IHC value: Score 1+ Menopausal status: Postmenopausal Ki-67 (%): 60 Stage used in treatment planning: Yes National guidelines used in treatment planning: Yes Type of national guideline used in treatment planning: NCCN   09/29/2021 - 10/27/2021 Chemotherapy   Patient is on Treatment Plan : BREAST Adjuvant CMF IV q21d     09/29/2021 -  Chemotherapy   Patient is on Treatment Plan : BREAST Adjuvant CMF IV q21d         INTERVAL HISTORY:  Ashley Villa is here today for repeat clinical assessment prior to her 5th cycle of CMF chemotherapy.  She continues to tolerate this fairly well.  She reports constipation, for which she is using Colace and suppositories as needed.   She denies fevers or chills. She denies pain. Her appetite is good. Her weight has decreased 1 pounds over last 3 weeks .  I advised  her to try a laxative in addition to the stool softener for the constipation.  She could use senna, MiraLAX or even milk of magnesia.  REVIEW OF SYSTEMS:  Review of Systems  Constitutional:  Negative for appetite change, chills, fatigue, fever and unexpected weight change.  HENT:   Negative for lump/mass, mouth sores and sore throat.   Respiratory:  Negative for cough and shortness of breath.   Cardiovascular:  Negative for chest pain and leg swelling.  Gastrointestinal:  Positive for constipation. Negative for abdominal pain, blood in stool, diarrhea, nausea and vomiting.  Endocrine: Negative for hot flashes.  Genitourinary:  Negative for difficulty urinating, dysuria, frequency and hematuria.   Musculoskeletal:  Negative for arthralgias, back pain and myalgias.  Skin:  Negative for rash.  Neurological:  Negative for dizziness and headaches.  Hematological:  Negative for adenopathy. Does not bruise/bleed easily.  Psychiatric/Behavioral:  Negative for depression and sleep disturbance. The patient is not nervous/anxious.      VITALS:  Blood pressure (!) 150/67, pulse (!) 55, temperature 97.9 F (36.6 C), temperature source Oral, resp. rate 18, height _0  (1.626 m), weight 135 lb 9.6 oz (61.5 kg), SpO2 95 %.  Wt Readings from Last 3 Encounters:  01/10/22 135 lb 9.6 oz (61.5 kg)  12/22/21 136 lb 1.3 oz (61.7 kg)  12/20/21 135 lb 14.4 oz (61.6 kg)    Body mass index is 23.28 kg/m.  Performance status (ECOG): 1 - Symptomatic but completely ambulatory  PHYSICAL EXAM:  Physical Exam Vitals and nursing note reviewed.  Constitutional:      General: She is not in acute distress.    Appearance: Normal appearance.  HENT:     Head: Normocephalic and atraumatic.     Mouth/Throat:     Mouth: Mucous membranes are moist.     Pharynx: Oropharynx is clear. No oropharyngeal exudate or posterior oropharyngeal erythema.  Eyes:     General: No scleral icterus.    Extraocular Movements:  Extraocular movements intact.     Conjunctiva/sclera: Conjunctivae normal.     Pupils: Pupils are equal, round, and reactive to light.  Cardiovascular:     Rate and Rhythm: Normal rate and regular rhythm.     Heart sounds: Normal heart sounds. No murmur heard.    No friction rub. No gallop.  Pulmonary:     Effort: Pulmonary effort is normal.     Breath sounds: Normal breath sounds. No wheezing, rhonchi or rales.  Chest:     Comments: Breast exam is deferred Abdominal:     General: There is no distension.     Palpations: Abdomen is soft. There is no hepatomegaly, splenomegaly or mass.     Tenderness: There is no abdominal tenderness.  Musculoskeletal:        General: Normal range of motion.     Cervical back: Normal range of motion and neck supple. No tenderness.     Right lower leg: No edema.     Left lower leg: No edema.  Lymphadenopathy:     Cervical: No cervical adenopathy.     Upper Body:  Right upper body: No supraclavicular or axillary adenopathy.     Left upper body: No supraclavicular or axillary adenopathy.     Lower Body: No right inguinal adenopathy. No left inguinal adenopathy.  Skin:    General: Skin is warm and dry.     Coloration: Skin is not jaundiced.     Findings: No rash.  Neurological:     Mental Status: She is alert and oriented to person, place, and time.     Cranial Nerves: No cranial nerve deficit.  Psychiatric:        Mood and Affect: Mood normal.        Behavior: Behavior normal.        Thought Content: Thought content normal.    LABS:      Latest Ref Rng & Units 01/10/2022   10:57 AM 12/20/2021   12:00 AM 11/29/2021   12:00 AM  CBC  WBC 4.0 - 10.5 K/uL 3.5  3.8     5.3      Hemoglobin 12.0 - 15.0 g/dL 13.5  13.7     14.1      Hematocrit 36.0 - 46.0 % 42.5  41     42      Platelets 150 - 400 K/uL 129  125     124         This result is from an external source.      Latest Ref Rng & Units 01/10/2022   10:57 AM 12/20/2021   12:00 AM  11/29/2021   12:00 AM  CMP  Glucose 70 - 99 mg/dL 119     BUN 8 - 23 mg/dL _0 Creatinine 0.44 - 1.00 mg/dL 0.90  0.8     0.8      Sodium 135 - 145 mmol/L 145  136     140      Potassium 3.5 - 5.1 mmol/L 3.9  4.1     3.6      Chloride 98 - 111 mmol/L 104  105     106      CO2 22 - 32 mmol/L _1 Calcium 8.9 - 10.3 mg/dL 9.6  9.2     9.1      Total Protein 6.5 - 8.1 g/dL 6.3     Total Bilirubin 0.3 - 1.2 mg/dL 1.3     Alkaline Phos 38 - 126 U/L 71  78     79      AST 15 - 41 U/L 32  38     39      ALT 0 - 44 U/L _2 This result is from an external source.     No results found for: "CEA1", "CEA" / No results found for: "CEA1", "CEA" No results found for: "PSA1" No results found for: "WNI627" No results found for: "CAN125"  No results found for: "TOTALPROTELP", "ALBUMINELP", "A1GS", "A2GS", "BETS", "BETA2SER", "GAMS", "MSPIKE", "SPEI" No results found for: "TIBC", "FERRITIN", "IRONPCTSAT" No results found for: "LDH"  STUDIES:  No results found.    HISTORY:   Past Medical History:  Diagnosis Date   H/O mitral valve repair    HTN (hypertension)    Status post left breast lumpectomy     Past Surgical History:  Procedure Laterality Date   TONSILLECTOMY  Family History  Problem Relation Age of Onset   Cancer Mother    Cancer Maternal Aunt     Social History:  reports that she has never smoked. She has never used smokeless tobacco. She reports that she does not drink alcohol and does not use drugs.The patient is accompanied by her husband today.  Allergies: No Known Allergies  Current Medications: Current Outpatient Medications  Medication Sig Dispense Refill   amoxicillin (AMOXIL) 500 MG capsule Prn prior to dentist visit with hx of mitral valve prolapse (Patient not taking: Reported on 11/29/2021)     Calcium Citrate-Vitamin D 315-5 MG-MCG TABS Take 1 tablet by mouth daily.     Cholecalciferol 25 MCG (1000 UT)  capsule Take by mouth.     Coenzyme Q10 50 MG CAPS Take by mouth.     docusate sodium (COLACE) 100 MG capsule Take 100 mg by mouth 2 (two) times daily.     glucosamine-chondroitin 500-400 MG tablet Take 1 tablet by mouth daily.     levocetirizine (XYZAL) 5 MG tablet Take 5 mg by mouth daily.     losartan (COZAAR) 100 MG tablet Take 100 mg by mouth daily.     metoprolol tartrate (LOPRESSOR) 25 MG tablet Take by mouth.     multivitamin-lutein (OCUVITE-LUTEIN) CAPS capsule Take 1 capsule by mouth daily.     ondansetron (ZOFRAN) 8 MG tablet Take 1 tablet (8 mg total) by mouth 2 (two) times daily as needed for refractory nausea / vomiting (Start on day 3 after chemotherapy). 30 tablet 1   prochlorperazine (COMPAZINE) 10 MG tablet Take 1 tablet (10 mg total) by mouth every 6 (six) hours as needed for nausea or vomiting. 30 tablet 1   simvastatin (ZOCOR) 20 MG tablet Take 20 mg by mouth at bedtime.     No current facility-administered medications for this visit.

## 2022-01-09 NOTE — Assessment & Plan Note (Signed)
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  Pathology revealed a 28 mm with 2 negative nodes in the background of high-grade ductal carcinoma in situ.  Margins were clear.  Estrogen receptors positive at 30% and progesterone receptors are negative and HER2 negative.  Ki-67 was 60%.    She was felt to be at higher risk for recurrence, so we discussed chemotherapy such as TC for 4 cycles.  However, we would have to take into consideration her advanced age of 78.  Her EndoPredict score then came back at 5.2, correlating with a 48% risk of distant recurrence in the next 10 years. She would have 24% benefit with chemotherapy.  It was decided to give her adjuvant CMF chemotherapy, which is an older and usually a more easily tolerated regimen.  She had persistent cytopenias with her first cycle of CMF, so her second cycle was delayed a week and doses were reduced.  Her third cycle was delayed due to [place] exposure.  She will proceed with a 5th cycle of CMF this week. We will plan to see her back in 3 weeks prior to a 6th and final cycle.

## 2022-01-10 ENCOUNTER — Encounter: Payer: Self-pay | Admitting: Hematology and Oncology

## 2022-01-10 ENCOUNTER — Inpatient Hospital Stay: Payer: Medicare HMO | Attending: Oncology

## 2022-01-10 ENCOUNTER — Inpatient Hospital Stay: Payer: Medicare HMO | Admitting: Hematology and Oncology

## 2022-01-10 VITALS — BP 150/67 | HR 55 | Temp 97.9°F | Resp 18 | Ht 64.0 in | Wt 135.6 lb

## 2022-01-10 DIAGNOSIS — Z170 Estrogen receptor positive status [ER+]: Secondary | ICD-10-CM

## 2022-01-10 DIAGNOSIS — C50412 Malignant neoplasm of upper-outer quadrant of left female breast: Secondary | ICD-10-CM | POA: Insufficient documentation

## 2022-01-10 DIAGNOSIS — Z5111 Encounter for antineoplastic chemotherapy: Secondary | ICD-10-CM | POA: Insufficient documentation

## 2022-01-10 LAB — CBC WITH DIFFERENTIAL (CANCER CENTER ONLY)
Abs Immature Granulocytes: 0.01 10*3/uL (ref 0.00–0.07)
Basophils Absolute: 0 10*3/uL (ref 0.0–0.1)
Basophils Relative: 1 %
Eosinophils Absolute: 0.1 10*3/uL (ref 0.0–0.5)
Eosinophils Relative: 3 %
HCT: 42.5 % (ref 36.0–46.0)
Hemoglobin: 13.5 g/dL (ref 12.0–15.0)
Immature Granulocytes: 0 %
Lymphocytes Relative: 27 %
Lymphs Abs: 1 10*3/uL (ref 0.7–4.0)
MCH: 30.4 pg (ref 26.0–34.0)
MCHC: 31.8 g/dL (ref 30.0–36.0)
MCV: 95.7 fL (ref 80.0–100.0)
Monocytes Absolute: 0.7 10*3/uL (ref 0.1–1.0)
Monocytes Relative: 20 %
Neutro Abs: 1.7 10*3/uL (ref 1.7–7.7)
Neutrophils Relative %: 49 %
Platelet Count: 129 10*3/uL — ABNORMAL LOW (ref 150–400)
RBC: 4.44 MIL/uL (ref 3.87–5.11)
RDW: 14.6 % (ref 11.5–15.5)
WBC Count: 3.5 10*3/uL — ABNORMAL LOW (ref 4.0–10.5)
nRBC: 0 % (ref 0.0–0.2)

## 2022-01-10 LAB — CMP (CANCER CENTER ONLY)
ALT: 27 U/L (ref 0–44)
AST: 32 U/L (ref 15–41)
Albumin: 3.9 g/dL (ref 3.5–5.0)
Alkaline Phosphatase: 71 U/L (ref 38–126)
Anion gap: 12 (ref 5–15)
BUN: 21 mg/dL (ref 8–23)
CO2: 29 mmol/L (ref 22–32)
Calcium: 9.6 mg/dL (ref 8.9–10.3)
Chloride: 104 mmol/L (ref 98–111)
Creatinine: 0.9 mg/dL (ref 0.44–1.00)
GFR, Estimated: >60 mL/min (ref >60)
Glucose, Bld: 119 mg/dL — ABNORMAL HIGH (ref 70–99)
Potassium: 3.9 mmol/L (ref 3.5–5.1)
Sodium: 145 mmol/L (ref 135–145)
Total Bilirubin: 1.3 mg/dL — ABNORMAL HIGH (ref 0.3–1.2)
Total Protein: 6.3 g/dL — ABNORMAL LOW (ref 6.5–8.1)

## 2022-01-11 ENCOUNTER — Telehealth: Payer: Self-pay

## 2022-01-11 MED FILL — Methotrexate Sodium Inj PF 50 MG/2ML (25 MG/ML): INTRAMUSCULAR | Qty: 1.6 | Status: AC

## 2022-01-11 MED FILL — Cyclophosphamide For Inj 1 GM: INTRAMUSCULAR | Qty: 34 | Status: AC

## 2022-01-11 MED FILL — Dexamethasone Sodium Phosphate Inj 100 MG/10ML: INTRAMUSCULAR | Qty: 1 | Status: AC

## 2022-01-11 MED FILL — Fluorouracil IV Soln 2.5 GM/50ML (50 MG/ML): INTRAVENOUS | Qty: 13 | Status: AC

## 2022-01-11 NOTE — Telephone Encounter (Signed)
-----   Message from [name], PA-C sent at 01/11/2022  1:29 PM EST ----- Please let her know her white blood count and platelets are a little low, but good for treatment. Thanks

## 2022-01-11 NOTE — Telephone Encounter (Signed)
Message left

## 2022-01-12 ENCOUNTER — Encounter: Payer: Self-pay | Admitting: Oncology

## 2022-01-12 ENCOUNTER — Other Ambulatory Visit: Payer: Self-pay

## 2022-01-12 ENCOUNTER — Inpatient Hospital Stay: Payer: Medicare HMO

## 2022-01-12 VITALS — BP 143/84 | HR 56 | Temp 97.4°F | Resp 20 | Ht 64.0 in | Wt 136.8 lb

## 2022-01-12 DIAGNOSIS — Z170 Estrogen receptor positive status [ER+]: Secondary | ICD-10-CM | POA: Diagnosis not present

## 2022-01-12 DIAGNOSIS — Z5111 Encounter for antineoplastic chemotherapy: Secondary | ICD-10-CM | POA: Diagnosis not present

## 2022-01-12 DIAGNOSIS — C50412 Malignant neoplasm of upper-outer quadrant of left female breast: Secondary | ICD-10-CM | POA: Diagnosis not present

## 2022-01-12 MED ORDER — HEPARIN SOD (PORK) LOCK FLUSH 100 UNIT/ML IV SOLN
500.0000 [IU] | Freq: Once | INTRAVENOUS | Status: AC | PRN
  Administered 2022-01-12: 500 [IU]

## 2022-01-12 MED ORDER — SODIUM CHLORIDE 0.9 % IV SOLN: Freq: Once | INTRAVENOUS | Status: AC

## 2022-01-12 MED ORDER — SODIUM CHLORIDE 0.9% FLUSH
10.0000 mL | INTRAVENOUS | Status: DC | PRN
  Administered 2022-01-12: 10 mL

## 2022-01-12 MED ORDER — SODIUM CHLORIDE 0.9 % IV SOLN
10.0000 mg | Freq: Once | INTRAVENOUS | Status: AC
  Administered 2022-01-12: 10 mg via INTRAVENOUS
  Filled 2022-01-12: qty 1

## 2022-01-12 MED ORDER — FLUOROURACIL CHEMO INJECTION 2.5 GM/50ML
400.0000 mg/m2 | Freq: Once | INTRAVENOUS | Status: AC
  Administered 2022-01-12: 650 mg via INTRAVENOUS
  Filled 2022-01-12: qty 13

## 2022-01-12 MED ORDER — METHOTREXATE SODIUM CHEMO INJECTION (PF) 50 MG/2ML
23.7500 mg/m2 | Freq: Once | INTRAMUSCULAR | Status: AC
  Administered 2022-01-12: 40 mg via INTRAVENOUS
  Filled 2022-01-12: qty 1.6

## 2022-01-12 MED ORDER — SODIUM CHLORIDE 0.9 % IV SOLN
400.0000 mg/m2 | Freq: Once | INTRAVENOUS | Status: AC
  Administered 2022-01-12: 680 mg via INTRAVENOUS
  Filled 2022-01-12: qty 34

## 2022-01-12 MED ORDER — PALONOSETRON HCL INJECTION 0.25 MG/5ML
0.2500 mg | Freq: Once | INTRAVENOUS | Status: AC
  Administered 2022-01-12: 0.25 mg via INTRAVENOUS
  Filled 2022-01-12: qty 5

## 2022-01-12 NOTE — Patient Instructions (Signed)
Methotrexate Injection What is this medication? METHOTREXATE (METH oh TREX ate) treats inflammatory conditions such as arthritis and psoriasis. It works by decreasing inflammation, which can reduce pain and prevent long-term injury to the joints and skin. It may also be used to treat some types of cancer. It works by slowing down the growth of cancer cells. This medicine may be used for other purposes; ask your health care provider or pharmacist if you have questions. What should I tell my care team before I take this medication? They need to know if you have any of these conditions: Fluid in the stomach area or lungs If you often drink alcohol Infection or immune system problems Kidney disease Liver disease Low blood counts (white cells, platelets, or red blood cells) Lung disease Recent or ongoing radiation Recent or upcoming vaccine Stomach ulcers Ulcerative colitis An unusual or allergic reaction to methotrexate, other medications, foods, dyes, or preservatives Pregnant or trying to get pregnant Breast-feeding How should I use this medication? This medication is for infusion into a vein or for injection into muscle or into the spinal fluid (whichever applies). It is usually given in a hospital or clinic setting. In rare cases, you might get this medication at home. You will be taught how to give this medication. Use exactly as directed. Take your medication at regular intervals. Do not take your medication more often than directed. If this medication is used for arthritis or psoriasis, it should be taken weekly, NOT daily. It is important that you put your used needles and syringes in a special sharps container. Do not put them in a trash can. If you do not have a sharps container, call your pharmacist or care team to get one. Talk to your care team about the use of this medication in children. While this medication may be prescribed for children as young as 2 years for selected  conditions, precautions do apply. Overdosage: If you think you have taken too much of this medicine contact a poison control center or emergency room at once. NOTE: This medicine is only for you. Do not share this medicine with others. What if I miss a dose? It is important not to miss your dose. Call your care team if you are unable to keep an appointment. If you give yourself the medication, and you miss a dose, talk with your care team. Do not take double or extra doses. What may interact with this medication? Do not take this medication with any of the following: Acitretin This medication may also interact with the following: Aspirin or aspirin-like medications including salicylates Azathioprine Certain antibiotics like chloramphenicol, penicillin, tetracycline Certain medications that treat or prevent blood clots like warfarin, apixaban, dabigatran, and rivaroxaban Certain medications for stomach problems like esomeprazole, omeprazole, pantoprazole Cyclosporine Dapsone Diuretics Folic acid Gold Hydroxychloroquine Live virus vaccines Medications for infection like acyclovir, adefovir, amphotericin B, bacitracin, cidofovir, foscarnet, ganciclovir, gentamicin, pentamidine, vancomycin Mercaptopurine NSAIDs, medications for pain and inflammation, like ibuprofen or naproxen Pamidronate Pemetrexed Penicillamine Phenylbutazone Phenytoin Probenecid Pyrimethamine Retinoids such as isotretinoin and tretinoin Steroid medications like prednisone or cortisone Sulfonamides like sulfasalazine and trimethoprim/sulfamethoxazole Theophylline Zoledronic acid This list may not describe all possible interactions. Give your health care provider a list of all the medicines, herbs, non-prescription drugs, or dietary supplements you use. Also tell them if you smoke, drink alcohol, or use illegal drugs. Some items may interact with your medicine. What should I watch for while using this  medication? This medication may make you feel  generally unwell. This is not uncommon as chemotherapy can affect healthy cells as well as cancer cells. Report any side effects. Continue your course of treatment even though you feel ill unless your care team tells you to stop. Your condition will be monitored carefully while you are receiving this medication. Avoid alcoholic drinks. This medication can cause serious side effects. To reduce the risk, your care team may give you other medications to take before receiving this one. Be sure to follow the directions from your care team. This medication can make you more sensitive to the sun. Keep out of the sun. If you cannot avoid being in the sun, wear protective clothing and use sunscreen. Do not use sun lamps or tanning beds/booths. You may get drowsy or dizzy. Do not drive, use machinery, or do anything that needs mental alertness until you know how this medication affects you. Do not stand or sit up quickly, especially if you are an older patient. This reduces the risk of dizzy or fainting spells. You may need blood work while you are taking this medication. Call your care team for advice if you get a fever, chills or sore throat, or other symptoms of a cold or flu. Do not treat yourself. This medication decreases your body's ability to fight infections. Try to avoid being around people who are sick. This medication may increase your risk to bruise or bleed. Call your care team if you notice any unusual bleeding. Be careful brushing or flossing your teeth or using a toothpick because you may get an infection or bleed more easily. If you have any dental work done, tell your dentist you are receiving this medication Check with your care team if you get an attack of severe diarrhea, nausea and vomiting, or if you sweat a lot. The loss of too much body fluid can make it dangerous for you to take this medication. Talk to your care team about your risk of  cancer. You may be more at risk for certain types of cancers if you take this medication. Do not become pregnant while taking this medication or for 6 months after stopping it. Women should inform their care team if they wish to become pregnant or think they might be pregnant. Men should not father a child while taking this medication and for 3 months after stopping it. There is potential for serious harm to an unborn child. Talk to your care team for more information. Do not breast-feed an infant while taking this medication or for 1 week after stopping it. This medication may make it more difficult to get pregnant or father a child. Talk to your care team if you are concerned about your fertility. What side effects may I notice from receiving this medication? Side effects that you should report to your care team as soon as possible: Allergic reactions--skin rash, itching, hives, swelling of the face, lips, tongue, or throat Blood clot--pain, swelling, or warmth in the leg, shortness of breath, chest pain Dry cough, shortness of breath or trouble breathing Infection--fever, chills, cough, sore throat, wounds that don't heal, pain or trouble when passing urine, general feeling of discomfort or being unwell Kidney injury--decrease in the amount of urine, swelling of the ankles, hands, or feet Liver injury--right upper belly pain, loss of appetite, nausea, light-colored stool, dark yellow or brown urine, yellowing of the skin or eyes, unusual weakness or fatigue Low red blood cell count--unusual weakness or fatigue, dizziness, headache, trouble breathing Redness, blistering, peeling, or loosening  of the skin, including inside the mouth Seizures Unusual bruising or bleeding Side effects that usually do not require medical attention (report to your care team if they continue or are bothersome): Diarrhea Dizziness Hair loss Nausea Pain, redness, or swelling with sores inside the mouth or  throat Vomiting This list may not describe all possible side effects. Call your doctor for medical advice about side effects. You may report side effects to FDA at 1-800-FDA-1088. Where should I keep my medication? This medication is given in a hospital or clinic. It will not be stored at home. NOTE: This sheet is a summary. It may not cover all possible information. If you have questions about this medicine, talk to your doctor, pharmacist, or health care provider.  2023 [employer]/Gold Standard (2020-04-26 00:00:00) Fluorouracil Injection What is this medication? FLUOROURACIL (flure oh YOOR a sil) treats some types of cancer. It works by slowing down the growth of cancer cells. This medicine may be used for other purposes; ask your health care provider or pharmacist if you have questions. COMMON BRAND NAME(S): Adrucil What should I tell my care team before I take this medication? They need to know if you have any of these conditions: Blood disorders Dihydropyrimidine dehydrogenase (DPD) deficiency Infection, such as chickenpox, cold sores, herpes Kidney disease Liver disease Poor nutrition Recent or ongoing radiation therapy An unusual or allergic reaction to fluorouracil, other medications, foods, dyes, or preservatives If you or your partner are pregnant or trying to get pregnant Breast-feeding How should I use this medication? This medication is injected into a vein. It is administered by your care team in a hospital or clinic setting. Talk to your care team about the use of this medication in children. Special care may be needed. Overdosage: If you think you have taken too much of this medicine contact a poison control center or emergency room at once. NOTE: This medicine is only for you. Do not share this medicine with others. What if I miss a dose? Keep appointments for follow-up doses. It is important not to miss your dose. Call your care team if you are unable to keep an  appointment. What may interact with this medication? Do not take this medication with any of the following: Live virus vaccines This medication may also interact with the following: Medications that treat or prevent blood clots, such as warfarin, enoxaparin, dalteparin This list may not describe all possible interactions. Give your health care provider a list of all the medicines, herbs, non-prescription drugs, or dietary supplements you use. Also tell them if you smoke, drink alcohol, or use illegal drugs. Some items may interact with your medicine. What should I watch for while using this medication? Your condition will be monitored carefully while you are receiving this medication. This medication may make you feel generally unwell. This is not uncommon as chemotherapy can affect healthy cells as well as cancer cells. Report any side effects. Continue your course of treatment even though you feel ill unless your care team tells you to stop. In some cases, you may be given additional medications to help with side effects. Follow all directions for their use. This medication may increase your risk of getting an infection. Call your care team for advice if you get a fever, chills, sore throat, or other symptoms of a cold or flu. Do not treat yourself. Try to avoid being around people who are sick. This medication may increase your risk to bruise or bleed. Call your care team if  you notice any unusual bleeding. Be careful brushing or flossing your teeth or using a toothpick because you may get an infection or bleed more easily. If you have any dental work done, tell your dentist you are receiving this medication. Avoid taking medications that contain aspirin, acetaminophen, ibuprofen, naproxen, or ketoprofen unless instructed by your care team. These medications may hide a fever. Do not treat diarrhea with over the counter products. Contact your care team if you have diarrhea that lasts more than 2  days or if it is severe and watery. This medication can make you more sensitive to the sun. Keep out of the sun. If you cannot avoid being in the sun, wear protective clothing and sunscreen. Do not use sun lamps, tanning beds, or tanning booths. Talk to your care team if you or your partner wish to become pregnant or think you might be pregnant. This medication can cause serious birth defects if taken during pregnancy and for 3 months after the last dose. A reliable form of contraception is recommended while taking this medication and for 3 months after the last dose. Talk to your care team about effective forms of contraception. Do not father a child while taking this medication and for 3 months after the last dose. Use a condom while having sex during this time period. Do not breastfeed while taking this medication. This medication may cause infertility. Talk to your care team if you are concerned about your fertility. What side effects may I notice from receiving this medication? Side effects that you should report to your care team as soon as possible: Allergic reactions--skin rash, itching, hives, swelling of the face, lips, tongue, or throat Heart attack--pain or tightness in the chest, shoulders, arms, or jaw, nausea, shortness of breath, cold or clammy skin, feeling faint or lightheaded Heart failure--shortness of breath, swelling of the ankles, feet, or hands, sudden weight gain, unusual weakness or fatigue Heart rhythm changes--fast or irregular heartbeat, dizziness, feeling faint or lightheaded, chest pain, trouble breathing High ammonia level--unusual weakness or fatigue, confusion, loss of appetite, nausea, vomiting, seizures Infection--fever, chills, cough, sore throat, wounds that don't heal, pain or trouble when passing urine, general feeling of discomfort or being unwell Low red blood cell level--unusual weakness or fatigue, dizziness, headache, trouble breathing Pain, tingling, or  numbness in the hands or feet, muscle weakness, change in vision, confusion or trouble speaking, loss of balance or coordination, trouble walking, seizures Redness, swelling, and blistering of the skin over hands and feet Severe or prolonged diarrhea Unusual bruising or bleeding Side effects that usually do not require medical attention (report to your care team if they continue or are bothersome): Dry skin Headache Increased tears Nausea Pain, redness, or swelling with sores inside the mouth or throat Sensitivity to light Vomiting This list may not describe all possible side effects. Call your doctor for medical advice about side effects. You may report side effects to FDA at 1-800-FDA-1088. Where should I keep my medication? This medication is given in a hospital or clinic. It will not be stored at home. NOTE: This sheet is a summary. It may not cover all possible information. If you have questions about this medicine, talk to your doctor, pharmacist, or health care provider.  2023 [employer]/Gold Standard (2021-06-21 00:00:00) Cyclophosphamide Injection What is this medication? CYCLOPHOSPHAMIDE (sye kloe FOSS fa mide) treats some types of cancer. It works by slowing down the growth of cancer cells. This medicine may be used for other purposes; ask your  health care provider or pharmacist if you have questions. COMMON BRAND NAME(S): Cyclophosphamide, Cytoxan, Neosar What should I tell my care team before I take this medication? They need to know if you have any of these conditions: Heart disease Irregular heartbeat or rhythm Infection Kidney problems Liver disease Low blood cell levels (white cells, platelets, or red blood cells) Lung disease Previous radiation Trouble passing urine An unusual or allergic reaction to cyclophosphamide, other medications, foods, dyes, or preservatives Pregnant or trying to get pregnant Breast-feeding How should I use this medication? This  medication is injected into a vein. It is given by your care team in a hospital or clinic setting. Talk to your care team about the use of this medication in children. Special care may be needed. Overdosage: If you think you have taken too much of this medicine contact a poison control center or emergency room at once. NOTE: This medicine is only for you. Do not share this medicine with others. What if I miss a dose? Keep appointments for follow-up doses. It is important not to miss your dose. Call your care team if you are unable to keep an appointment. What may interact with this medication? Amphotericin B Amiodarone Azathioprine Certain antivirals for HIV or hepatitis Certain medications for blood pressure, such as enalapril, lisinopril, quinapril Cyclosporine Diuretics Etanercept Indomethacin Medications that relax muscles Metronidazole Natalizumab Tamoxifen Warfarin This list may not describe all possible interactions. Give your health care provider a list of all the medicines, herbs, non-prescription drugs, or dietary supplements you use. Also tell them if you smoke, drink alcohol, or use illegal drugs. Some items may interact with your medicine. What should I watch for while using this medication? This medication may make you feel generally unwell. This is not uncommon as chemotherapy can affect healthy cells as well as cancer cells. Report any side effects. Continue your course of treatment even though you feel ill unless your care team tells you to stop. You may need blood work while you are taking this medication. This medication may increase your risk of getting an infection. Call your care team for advice if you get a fever, chills, sore throat, or other symptoms of a cold or flu. Do not treat yourself. Try to avoid being around people who are sick. Avoid taking medications that contain aspirin, acetaminophen, ibuprofen, naproxen, or ketoprofen unless instructed by your care  team. These medications may hide a fever. Be careful brushing or flossing your teeth or using a toothpick because you may get an infection or bleed more easily. If you have any dental work done, tell your dentist you are receiving this medication. Drink water or other fluids as directed. Urinate often, even at night. Some products may contain alcohol. Ask your care team if this medication contains alcohol. Be sure to tell all care teams you are taking this medicine. Certain medicines, like metronidazole and disulfiram, can cause an unpleasant reaction when taken with alcohol. The reaction includes flushing, headache, nausea, vomiting, sweating, and increased thirst. The reaction can last from 30 minutes to several hours. Talk to your care team if you wish to become pregnant or think you might be pregnant. This medication can cause serious birth defects if taken during pregnancy and for 1 year after the last dose. A negative pregnancy test is required before starting this medication. A reliable form of contraception is recommended while taking this medication and for 1 year after the last dose. Talk to your care team about reliable forms  of contraception. Do not father a child while taking this medication and for 4 months after the last dose. Use a condom during this time period. Do not breast-feed while taking this medication or for 1 week after the last dose. This medication may cause infertility. Talk to your care team if you are concerned about your fertility. Talk to your care team about your risk of cancer. You may be more at risk for certain types of cancer if you take this medication. What side effects may I notice from receiving this medication? Side effects that you should report to your care team as soon as possible: Allergic reactions--skin rash, itching, hives, swelling of the face, lips, tongue, or throat Dry cough, shortness of breath or trouble breathing Heart failure--shortness of breath,  swelling of the ankles, feet, or hands, sudden weight gain, unusual weakness or fatigue Heart muscle inflammation--unusual weakness or fatigue, shortness of breath, chest pain, fast or irregular heartbeat, dizziness, swelling of the ankles, feet, or hands Heart rhythm changes--fast or irregular heartbeat, dizziness, feeling faint or lightheaded, chest pain, trouble breathing Infection--fever, chills, cough, sore throat, wounds that don't heal, pain or trouble when passing urine, general feeling of discomfort or being unwell Kidney injury--decrease in the amount of urine, swelling of the ankles, hands, or feet Liver injury--right upper belly pain, loss of appetite, nausea, light-colored stool, dark yellow or brown urine, yellowing skin or eyes, unusual weakness or fatigue Low red blood cell level--unusual weakness or fatigue, dizziness, headache, trouble breathing Low sodium level--muscle weakness, fatigue, dizziness, headache, confusion Red or dark brown urine Unusual bruising or bleeding Side effects that usually do not require medical attention (report to your care team if they continue or are bothersome): Hair loss Irregular menstrual cycles or spotting Loss of appetite Nausea Pain, redness, or swelling with sores inside the mouth or throat Vomiting This list may not describe all possible side effects. Call your doctor for medical advice about side effects. You may report side effects to FDA at 1-800-FDA-1088. Where should I keep my medication? This medication is given in a hospital or clinic. It will not be stored at home. NOTE: This sheet is a summary. It may not cover all possible information. If you have questions about this medicine, talk to your doctor, pharmacist, or health care provider.  2023 [employer]/Gold Standard (2021-04-12 00:00:00)

## 2022-01-31 ENCOUNTER — Encounter: Payer: Self-pay | Admitting: Oncology

## 2022-01-31 ENCOUNTER — Inpatient Hospital Stay: Payer: Medicare HMO | Admitting: Oncology

## 2022-01-31 ENCOUNTER — Inpatient Hospital Stay: Payer: Medicare HMO

## 2022-01-31 ENCOUNTER — Telehealth: Payer: Self-pay

## 2022-01-31 VITALS — BP 167/87 | HR 59 | Temp 97.6°F | Resp 18 | Ht 64.0 in | Wt 135.5 lb

## 2022-01-31 DIAGNOSIS — Z780 Asymptomatic menopausal state: Secondary | ICD-10-CM

## 2022-01-31 DIAGNOSIS — M8580 Other specified disorders of bone density and structure, unspecified site: Secondary | ICD-10-CM

## 2022-01-31 DIAGNOSIS — C50412 Malignant neoplasm of upper-outer quadrant of left female breast: Secondary | ICD-10-CM

## 2022-01-31 DIAGNOSIS — D649 Anemia, unspecified: Secondary | ICD-10-CM | POA: Diagnosis not present

## 2022-01-31 DIAGNOSIS — Z170 Estrogen receptor positive status [ER+]: Secondary | ICD-10-CM | POA: Diagnosis not present

## 2022-01-31 DIAGNOSIS — Z5111 Encounter for antineoplastic chemotherapy: Secondary | ICD-10-CM | POA: Diagnosis not present

## 2022-01-31 LAB — CBC AND DIFFERENTIAL
HCT: 40 (ref 36–46)
Hemoglobin: 13.3 (ref 12.0–16.0)
Neutrophils Absolute: 1.58
Platelets: 122 10*3/uL — AB (ref 150–400)
WBC: 3.1

## 2022-01-31 LAB — CMP (CANCER CENTER ONLY)
ALT: 24 U/L (ref 0–44)
AST: 31 U/L (ref 15–41)
Albumin: 4 g/dL (ref 3.5–5.0)
Alkaline Phosphatase: 73 U/L (ref 38–126)
Anion gap: 5 (ref 5–15)
BUN: 20 mg/dL (ref 8–23)
CO2: 29 mmol/L (ref 22–32)
Calcium: 9.2 mg/dL (ref 8.9–10.3)
Chloride: 105 mmol/L (ref 98–111)
Creatinine: 0.8 mg/dL (ref 0.44–1.00)
GFR, Estimated: >60 mL/min (ref >60)
Glucose, Bld: 90 mg/dL (ref 70–99)
Potassium: 4 mmol/L (ref 3.5–5.1)
Sodium: 139 mmol/L (ref 135–145)
Total Bilirubin: 1.1 mg/dL (ref 0.3–1.2)
Total Protein: 6.9 g/dL (ref 6.5–8.1)

## 2022-01-31 LAB — CBC: RBC: 4.28 (ref 3.87–5.11)

## 2022-01-31 LAB — CBC W DIFFERENTIAL (~~LOC~~ CC SCANNED REPORT)

## 2022-01-31 NOTE — Progress Notes (Signed)
[name]  [address] [place],  [place]  [address] [phone]  Clinic Day:  01/31/22   Referring physician: [name], MD  ASSESSMENT & PLAN:   Stage IIB left breast cancer This is a grade 3 infiltrating ductal carcinoma with a T2 N0 M0 measuring 28 mm with 2 negative nodes.  She also has high-grade ductal carcinoma in situ.  Margins are clear.  Estrogen receptors positive at 30% and progesterone receptors are negative with HER2 negative and a Ki-67 of 60%.  I feel she is at higher risk for recurrence and discussed chemotherapy such as TC for 4 cycles.  However we have to take into consideration her advanced age of 42 and whether she would tolerate treatment.  Her EndoPredict score came back at 5.2, correlating with a 48% risk of distant recurrence in the next 10 years. She would have 24% benefit with chemotherapy. She decided to proceed with CMF chemotherapy and that was started 09/29/2021. We plan 6 months of treatment.    Osteopenia Bone density scan was done in September 2022 and reveals osteopenia of the hip with a T score of -2.0.  The spine readings were normal.  She was offered Fosamax but declined.  Large seroma/hematoma of the left breast This is stable.  Positive family history for breast cancer She has 2 maternal aunts and a maternal cousin as well as a first paternal cousin all with breast cancer, primarily in their 5s and 27s.  I discussed the option of genetic testing but she seems not inclined to pursue this since she has no children of her own and is 81 years old, so it would not likely change her management.  Neutropenia This resulted in a delay after her first cycle. Her white count has done well since the 20% dose reduction.Her total white count is 3.1 with an ANC of 1.58 today.  Mild thrombocytopenia  This is new and very mild, so will probably not be significant. We will monitor.    Plan: We will see her back in 3  weeks for her treatment and reevaluation with CBC and CMP on 02/21/2022 and then she'll receive her 7th cycle two days later on 02/23/2022. She is anxious to have her port removed as son as she is done. I discussed the assessment and treatment plan with the patient.  The patient was provided an opportunity to ask questions and all were answered.  The patient agreed with the plan and demonstrated an understanding of the instructions.  The patient was advised to call back if the symptoms worsen or if the condition fails to improve as anticipated.          [name], MD [facility] AT [facility] [address] [place] [address] Dept: [phone] Dept Fax: [phone]   Orders Placed This Encounter  Procedures   CBC and differential    This external order was created through the Results Console.   CBC    This external order was created through the Results Console.      CHIEF COMPLAINT:  CC: Stage IIB hormone receptor positive breast cancer  Current Treatment: Adjuvant cyclophosphamide/methotrexate/5-fluorouracil every 3 weeks  HISTORY OF PRESENT ILLNESS:  Ashley Villa 81 y.o. female is here because of recent diagnosis of left breast carcinoma. The cancer was detected by screening mammogram on March 6 which showed a possible mass. The cancer was not palpable prior to diagnosis.  She  was sent for diagnostic mammogram on March 23 and this confirmed a persistent mass in the slightly upper left breast, middle depth.  An ultrasound confirmed a 1.6 cm irregular hypoechoic mass at 12:00, 2 cm from the nipple.  She then had a biopsy performed on March 29 which revealed invasive ductal carcinoma.  Estrogen receptors were positive at 30% with progesterone receptors negative and HER2 negative although it did show 1+ on immuno histochemistry.  Ki 67 with 60%.  She was referred to Dr. Jerel Shepherd who performed a lumpectomy on May  10.  The final pathology revealed a grade 3 invasive ductal carcinoma with high-grade ductal carcinoma in situ.  Margins were clear and 2 sentinel nodes were negative.  The final measurement was 28 mm for a T2 N0 M0.  She does have yearly mammograms.   I reviewed her records extensively and collaborated the history with the patient. Oncology History  Breast cancer of upper-outer quadrant of left female breast ([place])  06/01/2021 Initial Diagnosis   Breast cancer of upper-outer quadrant of left female breast ([place])   07/13/2021 Cancer Staging   Staging form: Breast, AJCC 8th Edition - Clinical stage from 07/13/2021: Stage IIB (cT2, cN0(sn), cM0, G3, ER+, PR-, HER2-) - Signed by [name], MD on 08/10/2021 Histopathologic type: Infiltrating duct carcinoma, NOS Stage prefix: Initial diagnosis Method of lymph node assessment: Sentinel lymph node biopsy Nuclear grade: G3 Histologic grading system: 3 grade system Laterality: Left Tumor size (mm): 28 Lymph-vascular invasion (LVI): LVI not present (absent)/not identified Diagnostic confirmation: Positive histology Specimen type: Excision Staged by: Managing physician Percentage of positive estrogen receptors (%): 30 Percentage of positive progesterone receptors (%): 0 HER2-IHC interpretation: Negative HER2-IHC value: Score 1+ Menopausal status: Postmenopausal Ki-67 (%): 60 Stage used in treatment planning: Yes National guidelines used in treatment planning: Yes Type of national guideline used in treatment planning: NCCN   09/29/2021 - 10/27/2021 Chemotherapy   Patient is on Treatment Plan : BREAST Adjuvant CMF IV q21d     09/29/2021 -  Chemotherapy   Patient is on Treatment Plan : BREAST Adjuvant CMF IV q21d         INTERVAL HISTORY:  Mane is here today for a routine follow up prio to cycle 6 of CMF, and states she is feeling okay currently. She states she was feeling bad and had nausea on 01/28/2022 and felt normal the next day.  She reports itching and stinging where her port is placed. Her next upcoming appointment is 02/21/2022 with CBC and CMP for her 7th cycle two days later on 02/23/2022. Her appetite is well and she has lost 1 pound since her last visit. She denies any gastrointestinal or cardiorespiratory symptoms.  She denies fever, chills or other signs of infection. She denies nausea, vomiting, bowel issues, or abdominal pain.  She denies sore throat, cough, dyspnea, or chest pain.  REVIEW OF SYSTEMS:  Review of Systems  Constitutional:  Negative for appetite change, chills, fatigue, fever and unexpected weight change.  HENT:   Negative for lump/mass, mouth sores and sore throat.   Respiratory:  Negative for cough and shortness of breath.   Cardiovascular:  Negative for chest pain and leg swelling.  Gastrointestinal:  Negative for abdominal pain, constipation, diarrhea, nausea and vomiting.  Endocrine: Negative for hot flashes.  Genitourinary:  Negative for difficulty urinating, dysuria, frequency and hematuria.   Musculoskeletal:  Negative for arthralgias, back pain and myalgias.  Skin:  Negative for rash.  Neurological:  Negative for  dizziness and headaches.  Hematological:  Negative for adenopathy. Does not bruise/bleed easily.  Psychiatric/Behavioral:  Negative for depression and sleep disturbance. The patient is not nervous/anxious.      VITALS:  Blood pressure (!) 167/87, pulse (!) 59, temperature 97.6 F (36.4 C), temperature source Oral, resp. rate 18, height _0  (1.626 m), weight 135 lb 8 oz (61.5 kg), SpO2 97 %.  Wt Readings from Last 3 Encounters:  02/02/22 134 lb (60.8 kg)  01/31/22 135 lb 8 oz (61.5 kg)  01/12/22 136 lb 12 oz (62 kg)    Body mass index is 23.26 kg/m.  Performance status (ECOG): 0 - Asymptomatic  PHYSICAL EXAM:  Physical Exam Vitals and nursing note reviewed.  Constitutional:      General: She is not in acute distress.    Appearance: Normal appearance.  HENT:      Head: Normocephalic and atraumatic.     Mouth/Throat:     Mouth: Mucous membranes are moist.     Pharynx: Oropharynx is clear. No oropharyngeal exudate or posterior oropharyngeal erythema.  Eyes:     General: No scleral icterus.    Extraocular Movements: Extraocular movements intact.     Conjunctiva/sclera: Conjunctivae normal.     Pupils: Pupils are equal, round, and reactive to light.  Cardiovascular:     Rate and Rhythm: Normal rate and regular rhythm.     Pulses: Normal pulses.     Heart sounds: Normal heart sounds. No murmur heard.    No friction rub. No gallop.  Pulmonary:     Effort: Pulmonary effort is normal.     Breath sounds: Normal breath sounds. No wheezing, rhonchi or rales.  Chest:     Comments: Firm and non tender seroma in the chest measuring 3.5 cm across, A well healed scar in the upper inner quadrant. Healed left axillary scar.   Right upper side is tender mild fibrocystic changes in the inferior right breast. Faded scar in the upper outer breast.   Abdominal:     General: There is no distension.     Palpations: Abdomen is soft. There is no hepatomegaly, splenomegaly or mass.     Tenderness: There is no abdominal tenderness.  Musculoskeletal:        General: Normal range of motion.     Cervical back: Normal range of motion and neck supple. No tenderness.     Right lower leg: No edema.     Left lower leg: No edema.  Lymphadenopathy:     Cervical: No cervical adenopathy.     Upper Body:     Right upper body: No supraclavicular or axillary adenopathy.     Left upper body: No supraclavicular or axillary adenopathy.     Lower Body: No right inguinal adenopathy. No left inguinal adenopathy.  Skin:    General: Skin is warm and dry.     Coloration: Skin is not jaundiced.     Findings: No rash.  Neurological:     Mental Status: She is alert and oriented to person, place, and time.     Cranial Nerves: No cranial nerve deficit.  Psychiatric:        Mood and  Affect: Mood normal.        Behavior: Behavior normal.        Thought Content: Thought content normal.    LABS:      Latest Ref Rng & Units 01/31/2022   12:00 AM 01/10/2022   10:57 AM 12/20/2021   12:00 AM  CBC  WBC  3.1     3.5  3.8      Hemoglobin 12.0 - 16.0 13.3     13.5  13.7      Hematocrit 36 - 46 40     42.5  41      Platelets 150 - 400 K/uL 122     129  125         This result is from an external source.      Latest Ref Rng & Units 01/31/2022   10:41 AM 01/10/2022   10:57 AM 12/20/2021   12:00 AM  CMP  Glucose 70 - 99 mg/dL 90  119    BUN 8 - 23 mg/dL _0 Creatinine 0.44 - 1.00 mg/dL 0.80  0.90  0.8      Sodium 135 - 145 mmol/L 139  145  136      Potassium 3.5 - 5.1 mmol/L 4.0  3.9  4.1      Chloride 98 - 111 mmol/L 105  104  105      CO2 22 - 32 mmol/L _1 Calcium 8.9 - 10.3 mg/dL 9.2  9.6  9.2      Total Protein 6.5 - 8.1 g/dL 6.9  6.3    Total Bilirubin 0.3 - 1.2 mg/dL 1.1  1.3    Alkaline Phos 38 - 126 U/L 73  71  78      AST 15 - 41 U/L 31  32  38      ALT 0 - 44 U/L _2 This result is from an external source.     No results found for: "CEA1", "CEA" / No results found for: "CEA1", "CEA" No results found for: "PSA1" No results found for: "KUV750" No results found for: "CAN125"  No results found for: "TOTALPROTELP", "ALBUMINELP", "A1GS", "A2GS", "BETS", "BETA2SER", "GAMS", "MSPIKE", "SPEI" No results found for: "TIBC", "FERRITIN", "IRONPCTSAT" No results found for: "LDH"  STUDIES:  No results found.  EXAM:05/26/21 DIGITAL DIAGNOSTIC UNILATERAL LEFT MAMMOGRAM IMPRESSION: Highly suspicious 1.6 cm upper left breast mass. Tissue sampling is recommending. No abnormal appearing Left axillary lymph nodes.  HISTORY:   Past Medical History:  Diagnosis Date   H/O mitral valve repair    HTN (hypertension)    Status post left breast lumpectomy     Past Surgical History:  Procedure Laterality Date   TONSILLECTOMY       Family History  Problem Relation Age of Onset   Cancer Mother    Cancer Maternal Aunt     Social History:  reports that she has never smoked. She has never used smokeless tobacco. She reports that she does not drink alcohol and does not use drugs.The patient is accompanied by her husband today.  Allergies: No Known Allergies  Current Medications: Current Outpatient Medications  Medication Sig Dispense Refill   amoxicillin (AMOXIL) 500 MG capsule Prn prior to dentist visit with hx of mitral valve prolapse (Patient not taking: Reported on 11/29/2021)     Calcium Citrate-Vitamin D 315-5 MG-MCG TABS Take 1 tablet by mouth daily.     Cholecalciferol 25 MCG (1000 UT) capsule Take by mouth.     Coenzyme Q10 50 MG CAPS Take by mouth.     docusate sodium (COLACE) 100 MG capsule Take 100 mg by mouth 2 (two) times daily.  glucosamine-chondroitin 500-400 MG tablet Take 1 tablet by mouth daily.     levocetirizine (XYZAL) 5 MG tablet Take 5 mg by mouth daily.     losartan (COZAAR) 100 MG tablet Take 100 mg by mouth daily.     metoprolol tartrate (LOPRESSOR) 25 MG tablet Take by mouth.     multivitamin-lutein (OCUVITE-LUTEIN) CAPS capsule Take 1 capsule by mouth daily.     ondansetron (ZOFRAN) 8 MG tablet Take 1 tablet (8 mg total) by mouth 2 (two) times daily as needed for refractory nausea / vomiting (Start on day 3 after chemotherapy). 30 tablet 1   prochlorperazine (COMPAZINE) 10 MG tablet Take 1 tablet (10 mg total) by mouth every 6 (six) hours as needed for nausea or vomiting. 30 tablet 1   simvastatin (ZOCOR) 20 MG tablet Take 20 mg by mouth at bedtime.     No current facility-administered medications for this visit.         I,Gabriella Ballesteros,acting as a scribe for [name], MD.,have documented all relevant documentation on the behalf of [name], MD,as directed by  [name], MD while in the presence of [name], MD.

## 2022-01-31 NOTE — Telephone Encounter (Signed)
Attempted to contact patient to make her aware of her lab results. No answer. WBC 3.1, Hgb and platelet normal

## 2022-02-01 MED FILL — Cyclophosphamide For Inj 1 GM: INTRAMUSCULAR | Qty: 34 | Status: AC

## 2022-02-01 MED FILL — Methotrexate Sodium Inj PF 50 MG/2ML (25 MG/ML): INTRAMUSCULAR | Qty: 1.6 | Status: AC

## 2022-02-01 MED FILL — Fluorouracil IV Soln 2.5 GM/50ML (50 MG/ML): INTRAVENOUS | Qty: 13 | Status: AC

## 2022-02-01 MED FILL — Dexamethasone Sodium Phosphate Inj 100 MG/10ML: INTRAMUSCULAR | Qty: 1 | Status: AC

## 2022-02-02 ENCOUNTER — Inpatient Hospital Stay: Payer: Medicare HMO

## 2022-02-02 VITALS — BP 183/88 | HR 65 | Temp 98.0°F | Resp 18 | Ht 64.0 in | Wt 134.0 lb

## 2022-02-02 DIAGNOSIS — Z170 Estrogen receptor positive status [ER+]: Secondary | ICD-10-CM | POA: Diagnosis not present

## 2022-02-02 DIAGNOSIS — Z5111 Encounter for antineoplastic chemotherapy: Secondary | ICD-10-CM | POA: Diagnosis not present

## 2022-02-02 DIAGNOSIS — C50412 Malignant neoplasm of upper-outer quadrant of left female breast: Secondary | ICD-10-CM | POA: Diagnosis not present

## 2022-02-02 MED ORDER — SODIUM CHLORIDE 0.9 % IV SOLN: Freq: Once | INTRAVENOUS | Status: AC

## 2022-02-02 MED ORDER — PALONOSETRON HCL INJECTION 0.25 MG/5ML
0.2500 mg | Freq: Once | INTRAVENOUS | Status: AC
  Administered 2022-02-02: 0.25 mg via INTRAVENOUS
  Filled 2022-02-02: qty 5

## 2022-02-02 MED ORDER — SODIUM CHLORIDE 0.9 % IV SOLN
400.0000 mg/m2 | Freq: Once | INTRAVENOUS | Status: AC
  Administered 2022-02-02: 680 mg via INTRAVENOUS
  Filled 2022-02-02: qty 34

## 2022-02-02 MED ORDER — HEPARIN SOD (PORK) LOCK FLUSH 100 UNIT/ML IV SOLN
500.0000 [IU] | Freq: Once | INTRAVENOUS | Status: AC | PRN
  Administered 2022-02-02: 500 [IU]

## 2022-02-02 MED ORDER — SODIUM CHLORIDE 0.9 % IV SOLN
10.0000 mg | Freq: Once | INTRAVENOUS | Status: AC
  Administered 2022-02-02: 10 mg via INTRAVENOUS
  Filled 2022-02-02: qty 10

## 2022-02-02 MED ORDER — FLUOROURACIL CHEMO INJECTION 2.5 GM/50ML
400.0000 mg/m2 | Freq: Once | INTRAVENOUS | Status: AC
  Administered 2022-02-02: 650 mg via INTRAVENOUS
  Filled 2022-02-02: qty 13

## 2022-02-02 MED ORDER — METHOTREXATE SODIUM CHEMO INJECTION (PF) 50 MG/2ML
23.7500 mg/m2 | Freq: Once | INTRAMUSCULAR | Status: AC
  Administered 2022-02-02: 40 mg via INTRAVENOUS
  Filled 2022-02-02: qty 1.6

## 2022-02-02 MED ORDER — SODIUM CHLORIDE 0.9% FLUSH
10.0000 mL | INTRAVENOUS | Status: DC | PRN
  Administered 2022-02-02: 10 mL

## 2022-02-02 NOTE — Patient Instructions (Signed)
Fluorouracil Injection What is this medication? FLUOROURACIL (flure oh YOOR a sil) treats some types of cancer. It works by slowing down the growth of cancer cells. This medicine may be used for other purposes; ask your health care provider or pharmacist if you have questions. COMMON BRAND NAME(S): Adrucil What should I tell my care team before I take this medication? They need to know if you have any of these conditions: Blood disorders Dihydropyrimidine dehydrogenase (DPD) deficiency Infection, such as chickenpox, cold sores, herpes Kidney disease Liver disease Poor nutrition Recent or ongoing radiation therapy An unusual or allergic reaction to fluorouracil, other medications, foods, dyes, or preservatives If you or your partner are pregnant or trying to get pregnant Breast-feeding How should I use this medication? This medication is injected into a vein. It is administered by your care team in a hospital or clinic setting. Talk to your care team about the use of this medication in children. Special care may be needed. Overdosage: If you think you have taken too much of this medicine contact a poison control center or emergency room at once. NOTE: This medicine is only for you. Do not share this medicine with others. What if I miss a dose? Keep appointments for follow-up doses. It is important not to miss your dose. Call your care team if you are unable to keep an appointment. What may interact with this medication? Do not take this medication with any of the following: Live virus vaccines This medication may also interact with the following: Medications that treat or prevent blood clots, such as warfarin, enoxaparin, dalteparin This list may not describe all possible interactions. Give your health care provider a list of all the medicines, herbs, non-prescription drugs, or dietary supplements you use. Also tell them if you smoke, drink alcohol, or use illegal drugs. Some items may  interact with your medicine. What should I watch for while using this medication? Your condition will be monitored carefully while you are receiving this medication. This medication may make you feel generally unwell. This is not uncommon as chemotherapy can affect healthy cells as well as cancer cells. Report any side effects. Continue your course of treatment even though you feel ill unless your care team tells you to stop. In some cases, you may be given additional medications to help with side effects. Follow all directions for their use. This medication may increase your risk of getting an infection. Call your care team for advice if you get a fever, chills, sore throat, or other symptoms of a cold or flu. Do not treat yourself. Try to avoid being around people who are sick. This medication may increase your risk to bruise or bleed. Call your care team if you notice any unusual bleeding. Be careful brushing or flossing your teeth or using a toothpick because you may get an infection or bleed more easily. If you have any dental work done, tell your dentist you are receiving this medication. Avoid taking medications that contain aspirin, acetaminophen, ibuprofen, naproxen, or ketoprofen unless instructed by your care team. These medications may hide a fever. Do not treat diarrhea with over the counter products. Contact your care team if you have diarrhea that lasts more than 2 days or if it is severe and watery. This medication can make you more sensitive to the sun. Keep out of the sun. If you cannot avoid being in the sun, wear protective clothing and sunscreen. Do not use sun lamps, tanning beds, or tanning booths. Talk to   your care team if you or your partner wish to become pregnant or think you might be pregnant. This medication can cause serious birth defects if taken during pregnancy and for 3 months after the last dose. A reliable form of contraception is recommended while taking this  medication and for 3 months after the last dose. Talk to your care team about effective forms of contraception. Do not father a child while taking this medication and for 3 months after the last dose. Use a condom while having sex during this time period. Do not breastfeed while taking this medication. This medication may cause infertility. Talk to your care team if you are concerned about your fertility. What side effects may I notice from receiving this medication? Side effects that you should report to your care team as soon as possible: Allergic reactions--skin rash, itching, hives, swelling of the face, lips, tongue, or throat Heart attack--pain or tightness in the chest, shoulders, arms, or jaw, nausea, shortness of breath, cold or clammy skin, feeling faint or lightheaded Heart failure--shortness of breath, swelling of the ankles, feet, or hands, sudden weight gain, unusual weakness or fatigue Heart rhythm changes--fast or irregular heartbeat, dizziness, feeling faint or lightheaded, chest pain, trouble breathing High ammonia level--unusual weakness or fatigue, confusion, loss of appetite, nausea, vomiting, seizures Infection--fever, chills, cough, sore throat, wounds that don't heal, pain or trouble when passing urine, general feeling of discomfort or being unwell Low red blood cell level--unusual weakness or fatigue, dizziness, headache, trouble breathing Pain, tingling, or numbness in the hands or feet, muscle weakness, change in vision, confusion or trouble speaking, loss of balance or coordination, trouble walking, seizures Redness, swelling, and blistering of the skin over hands and feet Severe or prolonged diarrhea Unusual bruising or bleeding Side effects that usually do not require medical attention (report to your care team if they continue or are bothersome): Dry skin Headache Increased tears Nausea Pain, redness, or swelling with sores inside the mouth or throat Sensitivity  to light Vomiting This list may not describe all possible side effects. Call your doctor for medical advice about side effects. You may report side effects to FDA at 1-800-FDA-1088. Where should I keep my medication? This medication is given in a hospital or clinic. It will not be stored at home. NOTE: This sheet is a summary. It may not cover all possible information. If you have questions about this medicine, talk to your doctor, pharmacist, or health care provider.  2023 [employer]/Gold Standard (2021-06-21 00:00:00) Cyclophosphamide Injection What is this medication? CYCLOPHOSPHAMIDE (sye kloe FOSS fa mide) treats some types of cancer. It works by slowing down the growth of cancer cells. This medicine may be used for other purposes; ask your health care provider or pharmacist if you have questions. COMMON BRAND NAME(S): Cyclophosphamide, Cytoxan, Neosar What should I tell my care team before I take this medication? They need to know if you have any of these conditions: Heart disease Irregular heartbeat or rhythm Infection Kidney problems Liver disease Low blood cell levels (white cells, platelets, or red blood cells) Lung disease Previous radiation Trouble passing urine An unusual or allergic reaction to cyclophosphamide, other medications, foods, dyes, or preservatives Pregnant or trying to get pregnant Breast-feeding How should I use this medication? This medication is injected into a vein. It is given by your care team in a hospital or clinic setting. Talk to your care team about the use of this medication in children. Special care may be needed. Overdosage: If you  think you have taken too much of this medicine contact a poison control center or emergency room at once. NOTE: This medicine is only for you. Do not share this medicine with others. What if I miss a dose? Keep appointments for follow-up doses. It is important not to miss your dose. Call your care team if you are  unable to keep an appointment. What may interact with this medication? Amphotericin B Amiodarone Azathioprine Certain antivirals for HIV or hepatitis Certain medications for blood pressure, such as enalapril, lisinopril, quinapril Cyclosporine Diuretics Etanercept Indomethacin Medications that relax muscles Metronidazole Natalizumab Tamoxifen Warfarin This list may not describe all possible interactions. Give your health care provider a list of all the medicines, herbs, non-prescription drugs, or dietary supplements you use. Also tell them if you smoke, drink alcohol, or use illegal drugs. Some items may interact with your medicine. What should I watch for while using this medication? This medication may make you feel generally unwell. This is not uncommon as chemotherapy can affect healthy cells as well as cancer cells. Report any side effects. Continue your course of treatment even though you feel ill unless your care team tells you to stop. You may need blood work while you are taking this medication. This medication may increase your risk of getting an infection. Call your care team for advice if you get a fever, chills, sore throat, or other symptoms of a cold or flu. Do not treat yourself. Try to avoid being around people who are sick. Avoid taking medications that contain aspirin, acetaminophen, ibuprofen, naproxen, or ketoprofen unless instructed by your care team. These medications may hide a fever. Be careful brushing or flossing your teeth or using a toothpick because you may get an infection or bleed more easily. If you have any dental work done, tell your dentist you are receiving this medication. Drink water or other fluids as directed. Urinate often, even at night. Some products may contain alcohol. Ask your care team if this medication contains alcohol. Be sure to tell all care teams you are taking this medicine. Certain medicines, like metronidazole and disulfiram, can cause  an unpleasant reaction when taken with alcohol. The reaction includes flushing, headache, nausea, vomiting, sweating, and increased thirst. The reaction can last from 30 minutes to several hours. Talk to your care team if you wish to become pregnant or think you might be pregnant. This medication can cause serious birth defects if taken during pregnancy and for 1 year after the last dose. A negative pregnancy test is required before starting this medication. A reliable form of contraception is recommended while taking this medication and for 1 year after the last dose. Talk to your care team about reliable forms of contraception. Do not father a child while taking this medication and for 4 months after the last dose. Use a condom during this time period. Do not breast-feed while taking this medication or for 1 week after the last dose. This medication may cause infertility. Talk to your care team if you are concerned about your fertility. Talk to your care team about your risk of cancer. You may be more at risk for certain types of cancer if you take this medication. What side effects may I notice from receiving this medication? Side effects that you should report to your care team as soon as possible: Allergic reactions--skin rash, itching, hives, swelling of the face, lips, tongue, or throat Dry cough, shortness of breath or trouble breathing Heart failure--shortness of breath, swelling  of the ankles, feet, or hands, sudden weight gain, unusual weakness or fatigue Heart muscle inflammation--unusual weakness or fatigue, shortness of breath, chest pain, fast or irregular heartbeat, dizziness, swelling of the ankles, feet, or hands Heart rhythm changes--fast or irregular heartbeat, dizziness, feeling faint or lightheaded, chest pain, trouble breathing Infection--fever, chills, cough, sore throat, wounds that don't heal, pain or trouble when passing urine, general feeling of discomfort or being  unwell Kidney injury--decrease in the amount of urine, swelling of the ankles, hands, or feet Liver injury--right upper belly pain, loss of appetite, nausea, light-colored stool, dark yellow or brown urine, yellowing skin or eyes, unusual weakness or fatigue Low red blood cell level--unusual weakness or fatigue, dizziness, headache, trouble breathing Low sodium level--muscle weakness, fatigue, dizziness, headache, confusion Red or dark brown urine Unusual bruising or bleeding Side effects that usually do not require medical attention (report to your care team if they continue or are bothersome): Hair loss Irregular menstrual cycles or spotting Loss of appetite Nausea Pain, redness, or swelling with sores inside the mouth or throat Vomiting This list may not describe all possible side effects. Call your doctor for medical advice about side effects. You may report side effects to FDA at 1-800-FDA-1088. Where should I keep my medication? This medication is given in a hospital or clinic. It will not be stored at home. NOTE: This sheet is a summary. It may not cover all possible information. If you have questions about this medicine, talk to your doctor, pharmacist, or health care provider.  2023 [employer]/Gold Standard (2021-04-12 00:00:00) Methotrexate Injection What is this medication? METHOTREXATE (METH oh TREX ate) treats inflammatory conditions such as arthritis and psoriasis. It works by decreasing inflammation, which can reduce pain and prevent long-term injury to the joints and skin. It may also be used to treat some types of cancer. It works by slowing down the growth of cancer cells. This medicine may be used for other purposes; ask your health care provider or pharmacist if you have questions. What should I tell my care team before I take this medication? They need to know if you have any of these conditions: Fluid in the stomach area or lungs If you often drink alcohol Infection or  immune system problems Kidney disease Liver disease Low blood counts (white cells, platelets, or red blood cells) Lung disease Recent or ongoing radiation Recent or upcoming vaccine Stomach ulcers Ulcerative colitis An unusual or allergic reaction to methotrexate, other medications, foods, dyes, or preservatives Pregnant or trying to get pregnant Breast-feeding How should I use this medication? This medication is for infusion into a vein or for injection into muscle or into the spinal fluid (whichever applies). It is usually given in a hospital or clinic setting. In rare cases, you might get this medication at home. You will be taught how to give this medication. Use exactly as directed. Take your medication at regular intervals. Do not take your medication more often than directed. If this medication is used for arthritis or psoriasis, it should be taken weekly, NOT daily. It is important that you put your used needles and syringes in a special sharps container. Do not put them in a trash can. If you do not have a sharps container, call your pharmacist or care team to get one. Talk to your care team about the use of this medication in children. While this medication may be prescribed for children as young as 2 years for selected conditions, precautions do apply. Overdosage: If you  think you have taken too much of this medicine contact a poison control center or emergency room at once. NOTE: This medicine is only for you. Do not share this medicine with others. What if I miss a dose? It is important not to miss your dose. Call your care team if you are unable to keep an appointment. If you give yourself the medication, and you miss a dose, talk with your care team. Do not take double or extra doses. What may interact with this medication? Do not take this medication with any of the following: Acitretin This medication may also interact with the following: Aspirin or aspirin-like medications  including salicylates Azathioprine Certain antibiotics like chloramphenicol, penicillin, tetracycline Certain medications that treat or prevent blood clots like warfarin, apixaban, dabigatran, and rivaroxaban Certain medications for stomach problems like esomeprazole, omeprazole, pantoprazole Cyclosporine Dapsone Diuretics Folic acid Gold Hydroxychloroquine Live virus vaccines Medications for infection like acyclovir, adefovir, amphotericin B, bacitracin, cidofovir, foscarnet, ganciclovir, gentamicin, pentamidine, vancomycin Mercaptopurine NSAIDs, medications for pain and inflammation, like ibuprofen or naproxen Pamidronate Pemetrexed Penicillamine Phenylbutazone Phenytoin Probenecid Pyrimethamine Retinoids such as isotretinoin and tretinoin Steroid medications like prednisone or cortisone Sulfonamides like sulfasalazine and trimethoprim/sulfamethoxazole Theophylline Zoledronic acid This list may not describe all possible interactions. Give your health care provider a list of all the medicines, herbs, non-prescription drugs, or dietary supplements you use. Also tell them if you smoke, drink alcohol, or use illegal drugs. Some items may interact with your medicine. What should I watch for while using this medication? This medication may make you feel generally unwell. This is not uncommon as chemotherapy can affect healthy cells as well as cancer cells. Report any side effects. Continue your course of treatment even though you feel ill unless your care team tells you to stop. Your condition will be monitored carefully while you are receiving this medication. Avoid alcoholic drinks. This medication can cause serious side effects. To reduce the risk, your care team may give you other medications to take before receiving this one. Be sure to follow the directions from your care team. This medication can make you more sensitive to the sun. Keep out of the sun. If you cannot avoid being in  the sun, wear protective clothing and use sunscreen. Do not use sun lamps or tanning beds/booths. You may get drowsy or dizzy. Do not drive, use machinery, or do anything that needs mental alertness until you know how this medication affects you. Do not stand or sit up quickly, especially if you are an older patient. This reduces the risk of dizzy or fainting spells. You may need blood work while you are taking this medication. Call your care team for advice if you get a fever, chills or sore throat, or other symptoms of a cold or flu. Do not treat yourself. This medication decreases your body's ability to fight infections. Try to avoid being around people who are sick. This medication may increase your risk to bruise or bleed. Call your care team if you notice any unusual bleeding. Be careful brushing or flossing your teeth or using a toothpick because you may get an infection or bleed more easily. If you have any dental work done, tell your dentist you are receiving this medication Check with your care team if you get an attack of severe diarrhea, nausea and vomiting, or if you sweat a lot. The loss of too much body fluid can make it dangerous for you to take this medication. Talk to your care team  about your risk of cancer. You may be more at risk for certain types of cancers if you take this medication. Do not become pregnant while taking this medication or for 6 months after stopping it. Women should inform their care team if they wish to become pregnant or think they might be pregnant. Men should not father a child while taking this medication and for 3 months after stopping it. There is potential for serious harm to an unborn child. Talk to your care team for more information. Do not breast-feed an infant while taking this medication or for 1 week after stopping it. This medication may make it more difficult to get pregnant or father a child. Talk to your care team if you are concerned about your  fertility. What side effects may I notice from receiving this medication? Side effects that you should report to your care team as soon as possible: Allergic reactions--skin rash, itching, hives, swelling of the face, lips, tongue, or throat Blood clot--pain, swelling, or warmth in the leg, shortness of breath, chest pain Dry cough, shortness of breath or trouble breathing Infection--fever, chills, cough, sore throat, wounds that don't heal, pain or trouble when passing urine, general feeling of discomfort or being unwell Kidney injury--decrease in the amount of urine, swelling of the ankles, hands, or feet Liver injury--right upper belly pain, loss of appetite, nausea, light-colored stool, dark yellow or brown urine, yellowing of the skin or eyes, unusual weakness or fatigue Low red blood cell count--unusual weakness or fatigue, dizziness, headache, trouble breathing Redness, blistering, peeling, or loosening of the skin, including inside the mouth Seizures Unusual bruising or bleeding Side effects that usually do not require medical attention (report to your care team if they continue or are bothersome): Diarrhea Dizziness Hair loss Nausea Pain, redness, or swelling with sores inside the mouth or throat Vomiting This list may not describe all possible side effects. Call your doctor for medical advice about side effects. You may report side effects to FDA at 1-800-FDA-1088. Where should I keep my medication? This medication is given in a hospital or clinic. It will not be stored at home. NOTE: This sheet is a summary. It may not cover all possible information. If you have questions about this medicine, talk to your doctor, pharmacist, or health care provider.  2023 [employer]vier/Gold Standard (2020-04-26 00:00:00)

## 2022-02-15 ENCOUNTER — Encounter: Payer: Self-pay | Admitting: Oncology

## 2022-02-16 ENCOUNTER — Other Ambulatory Visit: Payer: Self-pay

## 2022-02-17 ENCOUNTER — Encounter: Payer: Self-pay | Admitting: Hematology and Oncology

## 2022-02-17 DIAGNOSIS — Z803 Family history of malignant neoplasm of breast: Secondary | ICD-10-CM

## 2022-02-17 HISTORY — DX: Family history of malignant neoplasm of breast: Z80.3

## 2022-02-17 NOTE — Assessment & Plan Note (Signed)
She has 2 maternal aunts and a maternal cousin as well as a first paternal cousin all with breast cancer, primarily in their 66s and 63s.  We offered to have her see the genetic counselor for consideration of testing for hereditary cancer syndromes, but she seems not inclined to pursue this since she has no children of her own.  Since is 81 years old, so it would not likely change her management.

## 2022-02-17 NOTE — Assessment & Plan Note (Signed)
We obtained her bone density scan from Dr. Hezzie Bump office from September 2022.  This revealed osteopenia with a T score of -2 in the femur and a T score of 0 in the spine.  The patient states she is taking calcium and vitamin D daily.  She does not remember any discussion about further treatment of her osteopenia.  As her risk of hip fracture in the next 10 years is 4.6, we  recommended starting treatment for her osteopenia, however, she is hesitant to do so while on chemotherapy treatment.  She understands that the adjuvant hormonal therapy can worsen her osteopenia.  We will plan to revisit this again once chemotherapy is complete.

## 2022-02-17 NOTE — Progress Notes (Signed)
Ashley Villa  [address] [place],  [place]  [address] [phone]  Clinic Day:  02/21/2022  Referring physician: [name], MD  ASSESSMENT & PLAN:   Assessment & Plan: Breast cancer of upper-outer quadrant of left female breast ([place]) Stage IIB (T2 N0 M0) grade 3, infiltrating ductal carcinoma.  Pathology revealed a 28 mm with 2 negative nodes in the background of high-grade ductal carcinoma in situ.  Margins were clear.  Estrogen receptors positive at 30% and progesterone receptors are negative and HER2 negative.  Ki-67 was 60%.    She was felt to be at higher risk for recurrence, so we discussed chemotherapy such as TC for 4 cycles.  However, we would have to take into consideration her advanced age of 81.  Her EndoPredict score then came back at 5.2, correlating with a 48% risk of distant recurrence in the next 10 years. She would have 24% benefit with chemotherapy.  It was decided to give her adjuvant CMF chemotherapy for 8 cycles, which is an older and usually a more easily tolerated regimen.  She had persistent cytopenias with her first cycle of CMF, so her second cycle was delayed a week and doses were reduced.  Her third cycle was delayed due to [place] exposure.  White blood cell count is normal and her platelets are fairly stable.  She now has borderline elevation of the bilirubin, which we will continue to monitor.  She will proceed with a 7th cycle of CMF this week. We will plan to see her back in 3 weeks prior to an 8th and final cycle.  Osteopenia after menopause We obtained her bone density scan from Dr. Hezzie Bump office from September 2022.  This revealed osteopenia with a T score of -2 in the femur and a T score of 0 in the spine.  The patient states she is taking calcium and vitamin D daily.  She does not remember any discussion about further treatment of her osteopenia.  As her risk of hip fracture in the next 10 years is 4.6, we   recommended starting treatment for her osteopenia, however, she is hesitant to do so while on chemotherapy treatment.  She understands that the adjuvant hormonal therapy can worsen her osteopenia.  We will plan to revisit this again once chemotherapy is complete.  Family history of breast cancer She has 2 maternal aunts and a maternal cousin as well as a first paternal cousin all with breast cancer, primarily in their 33s and 68s.  We offered to have her see the genetic counselor for consideration of testing for hereditary cancer syndromes, but she seems not inclined to pursue this since she has no children of her own.  Since is 45 years old, so it would not likely change her management.    The patient understands the plans discussed today and is in agreement with them.  She knows to contact our office if she develops concerns prior to her next appointment.   I provided 15 minutes of face-to-face time during this encounter and > 50% was spent counseling as documented under my assessment and plan.    [name], PA-C  [facility] [address] [place] [address] Dept: [phone] Dept Fax: [phone]   Orders Placed This Encounter  Procedures   CBC and differential    This external order was created through the Results Console.   CBC    This external  order was created through the Results Console.      CHIEF COMPLAINT:  CC: Stage IIB hormone receptor positive breast cancer  Current Treatment: CMF chemotherapy  HISTORY OF PRESENT ILLNESS:   Oncology History  Breast cancer of upper-outer quadrant of left female breast ([place])  06/01/2021 Initial Diagnosis   Breast cancer of upper-outer quadrant of left female breast ([place])   07/13/2021 Cancer Staging   Staging form: Breast, AJCC 8th Edition - Clinical stage from 07/13/2021: Stage IIB (cT2, cN0(sn), cM0, G3, ER+, PR-, HER2-) - Signed by [name], MD  on 08/10/2021 Histopathologic type: Infiltrating duct carcinoma, NOS Stage prefix: Initial diagnosis Method of lymph node assessment: Sentinel lymph node biopsy Nuclear grade: G3 Histologic grading system: 3 grade system Laterality: Left Tumor size (mm): 28 Lymph-vascular invasion (LVI): LVI not present (absent)/not identified Diagnostic confirmation: Positive histology Specimen type: Excision Staged by: Managing physician Percentage of positive estrogen receptors (%): 30 Percentage of positive progesterone receptors (%): 0 HER2-IHC interpretation: Negative HER2-IHC value: Score 1+ Menopausal status: Postmenopausal Ki-67 (%): 60 Stage used in treatment planning: Yes National guidelines used in treatment planning: Yes Type of national guideline used in treatment planning: NCCN   09/29/2021 - 10/27/2021 Chemotherapy   Patient is on Treatment Plan : BREAST Adjuvant CMF IV q21d     09/29/2021 -  Chemotherapy   Patient is on Treatment Plan : BREAST Adjuvant CMF IV q21d         INTERVAL HISTORY:  Ashley Villa is here today for repeat clinical assessment prior to his 7th cycle of CMF. She denies fevers or chills. She denies pain. Her appetite is good. Her weight has been stable.  She states her blood pressure was 147/99 this morning around 10:00 at which time she took her blood pressure medication.  I am concerned about the diastolic hypertension, so asked her to keep a close eye on this and contact Dr. Otho Perl and or Dr. Melody Haver if this persists.  REVIEW OF SYSTEMS:  Review of Systems  Constitutional:  Negative for appetite change, chills, fatigue, fever and unexpected weight change.  HENT:   Negative for lump/mass, mouth sores and sore throat.   Respiratory:  Negative for cough and shortness of breath.   Cardiovascular:  Positive for palpitations. Negative for chest pain and leg swelling.  Gastrointestinal:  Positive for constipation. Negative for abdominal pain, diarrhea, nausea and vomiting.   Endocrine: Negative for hot flashes.  Genitourinary:  Negative for difficulty urinating, dysuria, frequency and hematuria.   Musculoskeletal:  Negative for arthralgias, back pain and myalgias.  Skin:  Negative for rash.  Neurological:  Negative for dizziness and headaches.  Hematological:  Negative for adenopathy. Does not bruise/bleed easily.  Psychiatric/Behavioral:  Negative for depression and sleep disturbance. The patient is not nervous/anxious.      VITALS:  Blood pressure (!) 154/110, pulse (!) 57, temperature 98.1 F (36.7 C), temperature source Oral, resp. rate 18, height _0  (1.626 m), weight 134 lb 11.2 oz (61.1 kg), SpO2 96 %.  Wt Readings from Last 3 Encounters:  02/21/22 134 lb 11.2 oz (61.1 kg)  02/02/22 134 lb (60.8 kg)  01/31/22 135 lb 8 oz (61.5 kg)    Body mass index is 23.12 kg/m.  Performance status (ECOG): 1 - Symptomatic but completely ambulatory  PHYSICAL EXAM:  Physical Exam Vitals and nursing note reviewed.  Constitutional:      General: She is not in acute distress.    Appearance: Normal appearance.  HENT:  Head: Normocephalic and atraumatic.     Mouth/Throat:     Mouth: Mucous membranes are moist.     Pharynx: Oropharynx is clear. No oropharyngeal exudate or posterior oropharyngeal erythema.  Eyes:     General: No scleral icterus.    Extraocular Movements: Extraocular movements intact.     Conjunctiva/sclera: Conjunctivae normal.     Pupils: Pupils are equal, round, and reactive to light.  Cardiovascular:     Rate and Rhythm: Regular rhythm. Bradycardia present. Occasional Extrasystoles are present.    Heart sounds: Murmur heard.     Systolic murmur is present with a grade of 2/6.     No friction rub. No gallop.  Pulmonary:     Effort: Pulmonary effort is normal.     Breath sounds: Normal breath sounds. No wheezing, rhonchi or rales.  Abdominal:     General: There is no distension.     Palpations: Abdomen is soft. There is no  hepatomegaly, splenomegaly or mass.     Tenderness: There is no abdominal tenderness.  Musculoskeletal:        General: Normal range of motion.     Cervical back: Normal range of motion and neck supple. No tenderness.     Right lower leg: No edema.     Left lower leg: No edema.  Lymphadenopathy:     Cervical: No cervical adenopathy.     Upper Body:     Right upper body: No supraclavicular or axillary adenopathy.     Left upper body: No supraclavicular or axillary adenopathy.     Lower Body: No right inguinal adenopathy. No left inguinal adenopathy.  Skin:    General: Skin is warm and dry.     Coloration: Skin is not jaundiced.     Findings: No rash.  Neurological:     Mental Status: She is alert and oriented to person, place, and time.     Cranial Nerves: No cranial nerve deficit.  Psychiatric:        Mood and Affect: Mood normal.        Behavior: Behavior normal.        Thought Content: Thought content normal.    LABS:      Latest Ref Rng & Units 02/21/2022   12:00 AM 01/31/2022   12:00 AM 01/10/2022   10:57 AM  CBC  WBC  3.1     3.1     3.5   Hemoglobin 12.0 - 16.0 13.5     13.3     13.5   Hematocrit 36 - 46 41     40     42.5   Platelets 150 - 400 K/uL 125     122     129      This result is from an external source.      Latest Ref Rng & Units 02/21/2022   11:02 AM 01/31/2022   10:41 AM 01/10/2022   10:57 AM  CMP  Glucose 70 - 99 mg/dL 115  90  119   BUN 8 - 23 mg/dL _0 Creatinine 0.44 - 1.00 mg/dL 0.90  0.80  0.90   Sodium 135 - 145 mmol/L 142  139  145   Potassium 3.5 - 5.1 mmol/L 4.0  4.0  3.9   Chloride 98 - 111 mmol/L 106  105  104   CO2 22 - 32 mmol/L _1 Calcium 8.9 - 10.3 mg/dL 9.0  9.2  9.6   Total Protein 6.5 - 8.1 g/dL 6.5  6.9  6.3   Total Bilirubin 0.3 - 1.2 mg/dL 0.9  1.1  1.3   Alkaline Phos 38 - 126 U/L 63  73  71   AST 15 - 41 U/L 36  31  32   ALT 0 - 44 U/L 33  24  27      No results found for: "CEA1", "CEA" / No  results found for: "CEA1", "CEA" No results found for: "PSA1" No results found for: "IRW431" No results found for: "CAN125"  No results found for: "TOTALPROTELP", "ALBUMINELP", "A1GS", "A2GS", "BETS", "BETA2SER", "GAMS", "MSPIKE", "SPEI" No results found for: "TIBC", "FERRITIN", "IRONPCTSAT" No results found for: "LDH"  STUDIES:  No results found.    HISTORY:   Past Medical History:  Diagnosis Date   Family history of breast cancer 02/17/2022   H/O mitral valve repair    HTN (hypertension)    Status post left breast lumpectomy     Past Surgical History:  Procedure Laterality Date   TONSILLECTOMY      Family History  Problem Relation Age of Onset   Cancer Mother    Cancer Maternal Aunt     Social History:  reports that she has never smoked. She has never used smokeless tobacco. She reports that she does not drink alcohol and does not use drugs.The patient is accompanied by her husband today.  Allergies: No Known Allergies  Current Medications: Current Outpatient Medications  Medication Sig Dispense Refill   amoxicillin (AMOXIL) 500 MG capsule Prn prior to dentist visit with hx of mitral valve prolapse (Patient not taking: Reported on 11/29/2021)     Calcium Citrate-Vitamin D 315-5 MG-MCG TABS Take 1 tablet by mouth daily.     Cholecalciferol 25 MCG (1000 UT) capsule Take by mouth.     Coenzyme Q10 50 MG CAPS Take by mouth.     docusate sodium (COLACE) 100 MG capsule Take 100 mg by mouth 2 (two) times daily.     FLUAD QUADRIVALENT 0.5 ML injection      glucosamine-chondroitin 500-400 MG tablet Take 1 tablet by mouth daily.     levocetirizine (XYZAL) 5 MG tablet Take 5 mg by mouth daily.     losartan (COZAAR) 100 MG tablet Take 100 mg by mouth daily.     metoprolol tartrate (LOPRESSOR) 25 MG tablet Take by mouth.     multivitamin-lutein (OCUVITE-LUTEIN) CAPS capsule Take 1 capsule by mouth daily.     ondansetron (ZOFRAN) 8 MG tablet Take 1 tablet (8 mg total) by mouth 2  (two) times daily as needed for refractory nausea / vomiting (Start on day 3 after chemotherapy). 30 tablet 1   prochlorperazine (COMPAZINE) 10 MG tablet Take 1 tablet (10 mg total) by mouth every 6 (six) hours as needed for nausea or vomiting. 30 tablet 1   simvastatin (ZOCOR) 20 MG tablet Take 20 mg by mouth at bedtime.     No current facility-administered medications for this visit.

## 2022-02-17 NOTE — Assessment & Plan Note (Signed)
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  Pathology revealed a 28 mm with 2 negative nodes in the background of high-grade ductal carcinoma in situ.  Margins were clear.  Estrogen receptors positive at 30% and progesterone receptors are negative and HER2 negative.  Ki-67 was 60%.    She was felt to be at higher risk for recurrence, so we discussed chemotherapy such as TC for 4 cycles.  However, we would have to take into consideration her advanced age of 57.  Her EndoPredict score then came back at 5.2, correlating with a 48% risk of distant recurrence in the next 10 years. She would have 24% benefit with chemotherapy.  It was decided to give her adjuvant CMF chemotherapy for 8 cycles, which is an older and usually a more easily tolerated regimen.  She had persistent cytopenias with her first cycle of CMF, so her second cycle was delayed a week and doses were reduced.  Her third cycle was delayed due to [place] exposure.  White blood cell count is normal and her platelets are fairly stable.  She now has borderline elevation of the bilirubin, which we will continue to monitor.  She will proceed with a 7th cycle of CMF this week. We will plan to see her back in 3 weeks prior to an 8th and final cycle.

## 2022-02-21 ENCOUNTER — Encounter: Payer: Self-pay | Admitting: Hematology and Oncology

## 2022-02-21 ENCOUNTER — Inpatient Hospital Stay: Payer: Medicare HMO | Attending: Oncology

## 2022-02-21 ENCOUNTER — Inpatient Hospital Stay: Payer: Medicare HMO | Admitting: Hematology and Oncology

## 2022-02-21 VITALS — BP 154/110 | HR 57 | Temp 98.1°F | Resp 18 | Ht 64.0 in | Wt 134.7 lb

## 2022-02-21 DIAGNOSIS — C50412 Malignant neoplasm of upper-outer quadrant of left female breast: Secondary | ICD-10-CM | POA: Insufficient documentation

## 2022-02-21 DIAGNOSIS — Z803 Family history of malignant neoplasm of breast: Secondary | ICD-10-CM | POA: Diagnosis not present

## 2022-02-21 DIAGNOSIS — Z5111 Encounter for antineoplastic chemotherapy: Secondary | ICD-10-CM | POA: Diagnosis not present

## 2022-02-21 DIAGNOSIS — Z780 Asymptomatic menopausal state: Secondary | ICD-10-CM

## 2022-02-21 DIAGNOSIS — Z170 Estrogen receptor positive status [ER+]: Secondary | ICD-10-CM

## 2022-02-21 DIAGNOSIS — M8589 Other specified disorders of bone density and structure, multiple sites: Secondary | ICD-10-CM | POA: Diagnosis not present

## 2022-02-21 DIAGNOSIS — Z20822 Contact with and (suspected) exposure to covid-19: Secondary | ICD-10-CM | POA: Diagnosis not present

## 2022-02-21 DIAGNOSIS — M8580 Other specified disorders of bone density and structure, unspecified site: Secondary | ICD-10-CM

## 2022-02-21 DIAGNOSIS — I10 Essential (primary) hypertension: Secondary | ICD-10-CM | POA: Insufficient documentation

## 2022-02-21 DIAGNOSIS — D649 Anemia, unspecified: Secondary | ICD-10-CM | POA: Diagnosis not present

## 2022-02-21 LAB — CMP (CANCER CENTER ONLY)
ALT: 33 U/L (ref 0–44)
AST: 36 U/L (ref 15–41)
Albumin: 3.6 g/dL (ref 3.5–5.0)
Alkaline Phosphatase: 63 U/L (ref 38–126)
Anion gap: 7 (ref 5–15)
BUN: 22 mg/dL (ref 8–23)
CO2: 29 mmol/L (ref 22–32)
Calcium: 9 mg/dL (ref 8.9–10.3)
Chloride: 106 mmol/L (ref 98–111)
Creatinine: 0.9 mg/dL (ref 0.44–1.00)
GFR, Estimated: >60 mL/min (ref >60)
Glucose, Bld: 115 mg/dL — ABNORMAL HIGH (ref 70–99)
Potassium: 4 mmol/L (ref 3.5–5.1)
Sodium: 142 mmol/L (ref 135–145)
Total Bilirubin: 0.9 mg/dL (ref 0.3–1.2)
Total Protein: 6.5 g/dL (ref 6.5–8.1)

## 2022-02-21 LAB — CBC AND DIFFERENTIAL
HCT: 41 (ref 36–46)
Hemoglobin: 13.5 (ref 12.0–16.0)
MCV: 95 (ref 81–99)
Neutrophils Absolute: 1.55
Platelets: 125 10*3/uL — AB (ref 150–400)
WBC: 3.1

## 2022-02-21 LAB — CBC: RBC: 4.27 (ref 3.87–5.11)

## 2022-02-21 LAB — CBC W DIFFERENTIAL (~~LOC~~ CC SCANNED REPORT)

## 2022-02-21 NOTE — Progress Notes (Signed)
Face to face contact with pt in [place]. Pt is currently getting chemo and reports that she is doing well. Her only complaint is constipation for the first three to fours days after treatment. Pt is having a treatment this week.

## 2022-02-22 ENCOUNTER — Other Ambulatory Visit: Payer: Self-pay

## 2022-02-22 MED FILL — Methotrexate Sodium Inj PF 50 MG/2ML (25 MG/ML): INTRAMUSCULAR | Qty: 1.6 | Status: AC

## 2022-02-22 MED FILL — Fluorouracil IV Soln 2.5 GM/50ML (50 MG/ML): INTRAVENOUS | Qty: 13 | Status: AC

## 2022-02-22 MED FILL — Cyclophosphamide For Inj 1 GM: INTRAMUSCULAR | Qty: 34 | Status: AC

## 2022-02-22 MED FILL — Dexamethasone Sodium Phosphate Inj 100 MG/10ML: INTRAMUSCULAR | Qty: 1 | Status: AC

## 2022-02-23 ENCOUNTER — Inpatient Hospital Stay: Payer: Medicare HMO

## 2022-02-23 VITALS — BP 167/88 | HR 75 | Temp 97.4°F | Resp 18

## 2022-02-23 DIAGNOSIS — C50412 Malignant neoplasm of upper-outer quadrant of left female breast: Secondary | ICD-10-CM

## 2022-02-23 DIAGNOSIS — M8589 Other specified disorders of bone density and structure, multiple sites: Secondary | ICD-10-CM | POA: Diagnosis not present

## 2022-02-23 DIAGNOSIS — Z5111 Encounter for antineoplastic chemotherapy: Secondary | ICD-10-CM | POA: Diagnosis not present

## 2022-02-23 DIAGNOSIS — I10 Essential (primary) hypertension: Secondary | ICD-10-CM | POA: Diagnosis not present

## 2022-02-23 DIAGNOSIS — Z170 Estrogen receptor positive status [ER+]: Secondary | ICD-10-CM | POA: Diagnosis not present

## 2022-02-23 DIAGNOSIS — Z803 Family history of malignant neoplasm of breast: Secondary | ICD-10-CM | POA: Diagnosis not present

## 2022-02-23 DIAGNOSIS — Z20822 Contact with and (suspected) exposure to covid-19: Secondary | ICD-10-CM | POA: Diagnosis not present

## 2022-02-23 DIAGNOSIS — Z780 Asymptomatic menopausal state: Secondary | ICD-10-CM | POA: Diagnosis not present

## 2022-02-23 MED ORDER — SODIUM CHLORIDE 0.9 % IV SOLN
400.0000 mg/m2 | Freq: Once | INTRAVENOUS | Status: AC
  Administered 2022-02-23: 680 mg via INTRAVENOUS
  Filled 2022-02-23: qty 5

## 2022-02-23 MED ORDER — HEPARIN SOD (PORK) LOCK FLUSH 100 UNIT/ML IV SOLN
500.0000 [IU] | Freq: Once | INTRAVENOUS | Status: AC | PRN
  Administered 2022-02-23: 500 [IU]

## 2022-02-23 MED ORDER — SODIUM CHLORIDE 0.9% FLUSH
10.0000 mL | INTRAVENOUS | Status: DC | PRN
  Administered 2022-02-23: 10 mL

## 2022-02-23 MED ORDER — PALONOSETRON HCL INJECTION 0.25 MG/5ML
0.2500 mg | Freq: Once | INTRAVENOUS | Status: AC
  Administered 2022-02-23: 0.25 mg via INTRAVENOUS
  Filled 2022-02-23: qty 5

## 2022-02-23 MED ORDER — SODIUM CHLORIDE 0.9 % IV SOLN
10.0000 mg | Freq: Once | INTRAVENOUS | Status: AC
  Administered 2022-02-23: 10 mg via INTRAVENOUS
  Filled 2022-02-23: qty 10

## 2022-02-23 MED ORDER — FLUOROURACIL CHEMO INJECTION 2.5 GM/50ML
400.0000 mg/m2 | Freq: Once | INTRAVENOUS | Status: AC
  Administered 2022-02-23: 650 mg via INTRAVENOUS
  Filled 2022-02-23: qty 13

## 2022-02-23 MED ORDER — SODIUM CHLORIDE 0.9 % IV SOLN: Freq: Once | INTRAVENOUS | Status: AC

## 2022-02-23 MED ORDER — METHOTREXATE SODIUM CHEMO INJECTION (PF) 50 MG/2ML
23.7500 mg/m2 | Freq: Once | INTRAMUSCULAR | Status: AC
  Administered 2022-02-23: 40 mg via INTRAVENOUS
  Filled 2022-02-23: qty 1.6

## 2022-02-23 NOTE — Patient Instructions (Signed)
Fluorouracil Injection What is this medication? FLUOROURACIL (flure oh YOOR a sil) treats some types of cancer. It works by slowing down the growth of cancer cells. This medicine may be used for other purposes; ask your health care provider or pharmacist if you have questions. COMMON BRAND NAME(S): Adrucil What should I tell my care team before I take this medication? They need to know if you have any of these conditions: Blood disorders Dihydropyrimidine dehydrogenase (DPD) deficiency Infection, such as chickenpox, cold sores, herpes Kidney disease Liver disease Poor nutrition Recent or ongoing radiation therapy An unusual or allergic reaction to fluorouracil, other medications, foods, dyes, or preservatives If you or your partner are pregnant or trying to get pregnant Breast-feeding How should I use this medication? This medication is injected into a vein. It is administered by your care team in a hospital or clinic setting. Talk to your care team about the use of this medication in children. Special care may be needed. Overdosage: If you think you have taken too much of this medicine contact a poison control center or emergency room at once. NOTE: This medicine is only for you. Do not share this medicine with others. What if I miss a dose? Keep appointments for follow-up doses. It is important not to miss your dose. Call your care team if you are unable to keep an appointment. What may interact with this medication? Do not take this medication with any of the following: Live virus vaccines This medication may also interact with the following: Medications that treat or prevent blood clots, such as warfarin, enoxaparin, dalteparin This list may not describe all possible interactions. Give your health care provider a list of all the medicines, herbs, non-prescription drugs, or dietary supplements you use. Also tell them if you smoke, drink alcohol, or use illegal drugs. Some items may  interact with your medicine. What should I watch for while using this medication? Your condition will be monitored carefully while you are receiving this medication. This medication may make you feel generally unwell. This is not uncommon as chemotherapy can affect healthy cells as well as cancer cells. Report any side effects. Continue your course of treatment even though you feel ill unless your care team tells you to stop. In some cases, you may be given additional medications to help with side effects. Follow all directions for their use. This medication may increase your risk of getting an infection. Call your care team for advice if you get a fever, chills, sore throat, or other symptoms of a cold or flu. Do not treat yourself. Try to avoid being around people who are sick. This medication may increase your risk to bruise or bleed. Call your care team if you notice any unusual bleeding. Be careful brushing or flossing your teeth or using a toothpick because you may get an infection or bleed more easily. If you have any dental work done, tell your dentist you are receiving this medication. Avoid taking medications that contain aspirin, acetaminophen, ibuprofen, naproxen, or ketoprofen unless instructed by your care team. These medications may hide a fever. Do not treat diarrhea with over the counter products. Contact your care team if you have diarrhea that lasts more than 2 days or if it is severe and watery. This medication can make you more sensitive to the sun. Keep out of the sun. If you cannot avoid being in the sun, wear protective clothing and sunscreen. Do not use sun lamps, tanning beds, or tanning booths. Talk to   your care team if you or your partner wish to become pregnant or think you might be pregnant. This medication can cause serious birth defects if taken during pregnancy and for 3 months after the last dose. A reliable form of contraception is recommended while taking this  medication and for 3 months after the last dose. Talk to your care team about effective forms of contraception. Do not father a child while taking this medication and for 3 months after the last dose. Use a condom while having sex during this time period. Do not breastfeed while taking this medication. This medication may cause infertility. Talk to your care team if you are concerned about your fertility. What side effects may I notice from receiving this medication? Side effects that you should report to your care team as soon as possible: Allergic reactions--skin rash, itching, hives, swelling of the face, lips, tongue, or throat Heart attack--pain or tightness in the chest, shoulders, arms, or jaw, nausea, shortness of breath, cold or clammy skin, feeling faint or lightheaded Heart failure--shortness of breath, swelling of the ankles, feet, or hands, sudden weight gain, unusual weakness or fatigue Heart rhythm changes--fast or irregular heartbeat, dizziness, feeling faint or lightheaded, chest pain, trouble breathing High ammonia level--unusual weakness or fatigue, confusion, loss of appetite, nausea, vomiting, seizures Infection--fever, chills, cough, sore throat, wounds that don't heal, pain or trouble when passing urine, general feeling of discomfort or being unwell Low red blood cell level--unusual weakness or fatigue, dizziness, headache, trouble breathing Pain, tingling, or numbness in the hands or feet, muscle weakness, change in vision, confusion or trouble speaking, loss of balance or coordination, trouble walking, seizures Redness, swelling, and blistering of the skin over hands and feet Severe or prolonged diarrhea Unusual bruising or bleeding Side effects that usually do not require medical attention (report to your care team if they continue or are bothersome): Dry skin Headache Increased tears Nausea Pain, redness, or swelling with sores inside the mouth or throat Sensitivity  to light Vomiting This list may not describe all possible side effects. Call your doctor for medical advice about side effects. You may report side effects to FDA at 1-800-FDA-1088. Where should I keep my medication? This medication is given in a hospital or clinic. It will not be stored at home. NOTE: This sheet is a summary. It may not cover all possible information. If you have questions about this medicine, talk to your doctor, pharmacist, or health care provider.  2023 [employer]/Gold Standard (2021-06-21 00:00:00) Methotrexate Injection What is this medication? METHOTREXATE (METH oh TREX ate) treats inflammatory conditions such as arthritis and psoriasis. It works by decreasing inflammation, which can reduce pain and prevent long-term injury to the joints and skin. It may also be used to treat some types of cancer. It works by slowing down the growth of cancer cells. This medicine may be used for other purposes; ask your health care provider or pharmacist if you have questions. What should I tell my care team before I take this medication? They need to know if you have any of these conditions: Fluid in the stomach area or lungs If you often drink alcohol Infection or immune system problems Kidney disease Liver disease Low blood counts (white cells, platelets, or red blood cells) Lung disease Recent or ongoing radiation Recent or upcoming vaccine Stomach ulcers Ulcerative colitis An unusual or allergic reaction to methotrexate, other medications, foods, dyes, or preservatives Pregnant or trying to get pregnant Breast-feeding How should I use this medication?  This medication is for infusion into a vein or for injection into muscle or into the spinal fluid (whichever applies). It is usually given in a hospital or clinic setting. In rare cases, you might get this medication at home. You will be taught how to give this medication. Use exactly as directed. Take your medication at regular  intervals. Do not take your medication more often than directed. If this medication is used for arthritis or psoriasis, it should be taken weekly, NOT daily. It is important that you put your used needles and syringes in a special sharps container. Do not put them in a trash can. If you do not have a sharps container, call your pharmacist or care team to get one. Talk to your care team about the use of this medication in children. While this medication may be prescribed for children as young as 2 years for selected conditions, precautions do apply. Overdosage: If you think you have taken too much of this medicine contact a poison control center or emergency room at once. NOTE: This medicine is only for you. Do not share this medicine with others. What if I miss a dose? It is important not to miss your dose. Call your care team if you are unable to keep an appointment. If you give yourself the medication, and you miss a dose, talk with your care team. Do not take double or extra doses. What may interact with this medication? Do not take this medication with any of the following: Acitretin This medication may also interact with the following: Aspirin or aspirin-like medications including salicylates Azathioprine Certain antibiotics like chloramphenicol, penicillin, tetracycline Certain medications that treat or prevent blood clots like warfarin, apixaban, dabigatran, and rivaroxaban Certain medications for stomach problems like esomeprazole, omeprazole, pantoprazole Cyclosporine Dapsone Diuretics Folic acid Gold Hydroxychloroquine Live virus vaccines Medications for infection like acyclovir, adefovir, amphotericin B, bacitracin, cidofovir, foscarnet, ganciclovir, gentamicin, pentamidine, vancomycin Mercaptopurine NSAIDs, medications for pain and inflammation, like ibuprofen or naproxen Pamidronate Pemetrexed Penicillamine Phenylbutazone Phenytoin Probenecid Pyrimethamine Retinoids  such as isotretinoin and tretinoin Steroid medications like prednisone or cortisone Sulfonamides like sulfasalazine and trimethoprim/sulfamethoxazole Theophylline Zoledronic acid This list may not describe all possible interactions. Give your health care provider a list of all the medicines, herbs, non-prescription drugs, or dietary supplements you use. Also tell them if you smoke, drink alcohol, or use illegal drugs. Some items may interact with your medicine. What should I watch for while using this medication? This medication may make you feel generally unwell. This is not uncommon as chemotherapy can affect healthy cells as well as cancer cells. Report any side effects. Continue your course of treatment even though you feel ill unless your care team tells you to stop. Your condition will be monitored carefully while you are receiving this medication. Avoid alcoholic drinks. This medication can cause serious side effects. To reduce the risk, your care team may give you other medications to take before receiving this one. Be sure to follow the directions from your care team. This medication can make you more sensitive to the sun. Keep out of the sun. If you cannot avoid being in the sun, wear protective clothing and use sunscreen. Do not use sun lamps or tanning beds/booths. You may get drowsy or dizzy. Do not drive, use machinery, or do anything that needs mental alertness until you know how this medication affects you. Do not stand or sit up quickly, especially if you are an older patient. This reduces the risk of  dizzy or fainting spells. You may need blood work while you are taking this medication. Call your care team for advice if you get a fever, chills or sore throat, or other symptoms of a cold or flu. Do not treat yourself. This medication decreases your body's ability to fight infections. Try to avoid being around people who are sick. This medication may increase your risk to bruise or  bleed. Call your care team if you notice any unusual bleeding. Be careful brushing or flossing your teeth or using a toothpick because you may get an infection or bleed more easily. If you have any dental work done, tell your dentist you are receiving this medication Check with your care team if you get an attack of severe diarrhea, nausea and vomiting, or if you sweat a lot. The loss of too much body fluid can make it dangerous for you to take this medication. Talk to your care team about your risk of cancer. You may be more at risk for certain types of cancers if you take this medication. Do not become pregnant while taking this medication or for 6 months after stopping it. Women should inform their care team if they wish to become pregnant or think they might be pregnant. Men should not father a child while taking this medication and for 3 months after stopping it. There is potential for serious harm to an unborn child. Talk to your care team for more information. Do not breast-feed an infant while taking this medication or for 1 week after stopping it. This medication may make it more difficult to get pregnant or father a child. Talk to your care team if you are concerned about your fertility. What side effects may I notice from receiving this medication? Side effects that you should report to your care team as soon as possible: Allergic reactions--skin rash, itching, hives, swelling of the face, lips, tongue, or throat Blood clot--pain, swelling, or warmth in the leg, shortness of breath, chest pain Dry cough, shortness of breath or trouble breathing Infection--fever, chills, cough, sore throat, wounds that don't heal, pain or trouble when passing urine, general feeling of discomfort or being unwell Kidney injury--decrease in the amount of urine, swelling of the ankles, hands, or feet Liver injury--right upper belly pain, loss of appetite, nausea, light-colored stool, dark yellow or brown urine,  yellowing of the skin or eyes, unusual weakness or fatigue Low red blood cell count--unusual weakness or fatigue, dizziness, headache, trouble breathing Redness, blistering, peeling, or loosening of the skin, including inside the mouth Seizures Unusual bruising or bleeding Side effects that usually do not require medical attention (report to your care team if they continue or are bothersome): Diarrhea Dizziness Hair loss Nausea Pain, redness, or swelling with sores inside the mouth or throat Vomiting This list may not describe all possible side effects. Call your doctor for medical advice about side effects. You may report side effects to FDA at 1-800-FDA-1088. Where should I keep my medication? This medication is given in a hospital or clinic. It will not be stored at home. NOTE: This sheet is a summary. It may not cover all possible information. If you have questions about this medicine, talk to your doctor, pharmacist, or health care provider.  2023 [employer]/Gold Standard (2020-04-26 00:00:00) Cyclophosphamide Injection What is this medication? CYCLOPHOSPHAMIDE (sye kloe FOSS fa mide) treats some types of cancer. It works by slowing down the growth of cancer cells. This medicine may be used for other purposes; ask your  health care provider or pharmacist if you have questions. COMMON BRAND NAME(S): Cyclophosphamide, Cytoxan, Neosar What should I tell my care team before I take this medication? They need to know if you have any of these conditions: Heart disease Irregular heartbeat or rhythm Infection Kidney problems Liver disease Low blood cell levels (white cells, platelets, or red blood cells) Lung disease Previous radiation Trouble passing urine An unusual or allergic reaction to cyclophosphamide, other medications, foods, dyes, or preservatives Pregnant or trying to get pregnant Breast-feeding How should I use this medication? This medication is injected into a vein. It is  given by your care team in a hospital or clinic setting. Talk to your care team about the use of this medication in children. Special care may be needed. Overdosage: If you think you have taken too much of this medicine contact a poison control center or emergency room at once. NOTE: This medicine is only for you. Do not share this medicine with others. What if I miss a dose? Keep appointments for follow-up doses. It is important not to miss your dose. Call your care team if you are unable to keep an appointment. What may interact with this medication? Amphotericin B Amiodarone Azathioprine Certain antivirals for HIV or hepatitis Certain medications for blood pressure, such as enalapril, lisinopril, quinapril Cyclosporine Diuretics Etanercept Indomethacin Medications that relax muscles Metronidazole Natalizumab Tamoxifen Warfarin This list may not describe all possible interactions. Give your health care provider a list of all the medicines, herbs, non-prescription drugs, or dietary supplements you use. Also tell them if you smoke, drink alcohol, or use illegal drugs. Some items may interact with your medicine. What should I watch for while using this medication? This medication may make you feel generally unwell. This is not uncommon as chemotherapy can affect healthy cells as well as cancer cells. Report any side effects. Continue your course of treatment even though you feel ill unless your care team tells you to stop. You may need blood work while you are taking this medication. This medication may increase your risk of getting an infection. Call your care team for advice if you get a fever, chills, sore throat, or other symptoms of a cold or flu. Do not treat yourself. Try to avoid being around people who are sick. Avoid taking medications that contain aspirin, acetaminophen, ibuprofen, naproxen, or ketoprofen unless instructed by your care team. These medications may hide a fever. Be  careful brushing or flossing your teeth or using a toothpick because you may get an infection or bleed more easily. If you have any dental work done, tell your dentist you are receiving this medication. Drink water or other fluids as directed. Urinate often, even at night. Some products may contain alcohol. Ask your care team if this medication contains alcohol. Be sure to tell all care teams you are taking this medicine. Certain medicines, like metronidazole and disulfiram, can cause an unpleasant reaction when taken with alcohol. The reaction includes flushing, headache, nausea, vomiting, sweating, and increased thirst. The reaction can last from 30 minutes to several hours. Talk to your care team if you wish to become pregnant or think you might be pregnant. This medication can cause serious birth defects if taken during pregnancy and for 1 year after the last dose. A negative pregnancy test is required before starting this medication. A reliable form of contraception is recommended while taking this medication and for 1 year after the last dose. Talk to your care team about reliable forms  of contraception. Do not father a child while taking this medication and for 4 months after the last dose. Use a condom during this time period. Do not breast-feed while taking this medication or for 1 week after the last dose. This medication may cause infertility. Talk to your care team if you are concerned about your fertility. Talk to your care team about your risk of cancer. You may be more at risk for certain types of cancer if you take this medication. What side effects may I notice from receiving this medication? Side effects that you should report to your care team as soon as possible: Allergic reactions--skin rash, itching, hives, swelling of the face, lips, tongue, or throat Dry cough, shortness of breath or trouble breathing Heart failure--shortness of breath, swelling of the ankles, feet, or hands,  sudden weight gain, unusual weakness or fatigue Heart muscle inflammation--unusual weakness or fatigue, shortness of breath, chest pain, fast or irregular heartbeat, dizziness, swelling of the ankles, feet, or hands Heart rhythm changes--fast or irregular heartbeat, dizziness, feeling faint or lightheaded, chest pain, trouble breathing Infection--fever, chills, cough, sore throat, wounds that don't heal, pain or trouble when passing urine, general feeling of discomfort or being unwell Kidney injury--decrease in the amount of urine, swelling of the ankles, hands, or feet Liver injury--right upper belly pain, loss of appetite, nausea, light-colored stool, dark yellow or brown urine, yellowing skin or eyes, unusual weakness or fatigue Low red blood cell level--unusual weakness or fatigue, dizziness, headache, trouble breathing Low sodium level--muscle weakness, fatigue, dizziness, headache, confusion Red or dark brown urine Unusual bruising or bleeding Side effects that usually do not require medical attention (report to your care team if they continue or are bothersome): Hair loss Irregular menstrual cycles or spotting Loss of appetite Nausea Pain, redness, or swelling with sores inside the mouth or throat Vomiting This list may not describe all possible side effects. Call your doctor for medical advice about side effects. You may report side effects to FDA at 1-800-FDA-1088. Where should I keep my medication? This medication is given in a hospital or clinic. It will not be stored at home. NOTE: This sheet is a summary. It may not cover all possible information. If you have questions about this medicine, talk to your doctor, pharmacist, or health care provider.  2023 [employer]/Gold Standard (2021-04-12 00:00:00)  2023 [employer]/Gold Standard (2021-04-12 00:00:00)

## 2022-03-14 ENCOUNTER — Other Ambulatory Visit: Payer: Self-pay

## 2022-03-14 ENCOUNTER — Other Ambulatory Visit: Payer: Self-pay | Admitting: Oncology

## 2022-03-14 ENCOUNTER — Inpatient Hospital Stay: Payer: Medicare HMO | Attending: Oncology | Admitting: Oncology

## 2022-03-14 ENCOUNTER — Inpatient Hospital Stay: Payer: Medicare HMO

## 2022-03-14 ENCOUNTER — Encounter: Payer: Self-pay | Admitting: Oncology

## 2022-03-14 VITALS — BP 172/106 | HR 61 | Temp 97.8°F | Resp 18 | Ht 64.0 in | Wt 132.6 lb

## 2022-03-14 DIAGNOSIS — C50412 Malignant neoplasm of upper-outer quadrant of left female breast: Secondary | ICD-10-CM | POA: Diagnosis not present

## 2022-03-14 DIAGNOSIS — D696 Thrombocytopenia, unspecified: Secondary | ICD-10-CM

## 2022-03-14 DIAGNOSIS — Z170 Estrogen receptor positive status [ER+]: Secondary | ICD-10-CM | POA: Insufficient documentation

## 2022-03-14 DIAGNOSIS — Z780 Asymptomatic menopausal state: Secondary | ICD-10-CM | POA: Diagnosis not present

## 2022-03-14 DIAGNOSIS — D702 Other drug-induced agranulocytosis: Secondary | ICD-10-CM | POA: Insufficient documentation

## 2022-03-14 DIAGNOSIS — M85859 Other specified disorders of bone density and structure, unspecified thigh: Secondary | ICD-10-CM | POA: Diagnosis not present

## 2022-03-14 DIAGNOSIS — Z5111 Encounter for antineoplastic chemotherapy: Secondary | ICD-10-CM | POA: Diagnosis not present

## 2022-03-14 DIAGNOSIS — M8580 Other specified disorders of bone density and structure, unspecified site: Secondary | ICD-10-CM

## 2022-03-14 DIAGNOSIS — Z79899 Other long term (current) drug therapy: Secondary | ICD-10-CM | POA: Diagnosis not present

## 2022-03-14 DIAGNOSIS — D709 Neutropenia, unspecified: Secondary | ICD-10-CM | POA: Diagnosis not present

## 2022-03-14 LAB — CBC WITH DIFFERENTIAL (CANCER CENTER ONLY)
Abs Immature Granulocytes: 0.01 10*3/uL (ref 0.00–0.07)
Basophils Absolute: 0 10*3/uL (ref 0.0–0.1)
Basophils Relative: 1 %
Eosinophils Absolute: 0.1 10*3/uL (ref 0.0–0.5)
Eosinophils Relative: 3 %
HCT: 40.8 % (ref 36.0–46.0)
Hemoglobin: 13.2 g/dL (ref 12.0–15.0)
Immature Granulocytes: 0 %
Lymphocytes Relative: 19 %
Lymphs Abs: 0.7 10*3/uL (ref 0.7–4.0)
MCH: 31.6 pg (ref 26.0–34.0)
MCHC: 32.4 g/dL (ref 30.0–36.0)
MCV: 97.6 fL (ref 80.0–100.0)
Monocytes Absolute: 0.6 10*3/uL (ref 0.1–1.0)
Monocytes Relative: 18 %
Neutro Abs: 2.1 10*3/uL (ref 1.7–7.7)
Neutrophils Relative %: 59 %
Platelet Count: 125 10*3/uL — ABNORMAL LOW (ref 150–400)
RBC: 4.18 MIL/uL (ref 3.87–5.11)
RDW: 14.6 % (ref 11.5–15.5)
WBC Count: 3.5 10*3/uL — ABNORMAL LOW (ref 4.0–10.5)
nRBC: 0 % (ref 0.0–0.2)

## 2022-03-14 LAB — CMP (CANCER CENTER ONLY)
ALT: 21 U/L (ref 0–44)
AST: 29 U/L (ref 15–41)
Albumin: 3.7 g/dL (ref 3.5–5.0)
Alkaline Phosphatase: 75 U/L (ref 38–126)
Anion gap: 8 (ref 5–15)
BUN: 20 mg/dL (ref 8–23)
CO2: 28 mmol/L (ref 22–32)
Calcium: 9.2 mg/dL (ref 8.9–10.3)
Chloride: 104 mmol/L (ref 98–111)
Creatinine: 0.88 mg/dL (ref 0.44–1.00)
GFR, Estimated: >60 mL/min (ref >60)
Glucose, Bld: 139 mg/dL — ABNORMAL HIGH (ref 70–99)
Potassium: 3.4 mmol/L — ABNORMAL LOW (ref 3.5–5.1)
Sodium: 140 mmol/L (ref 135–145)
Total Bilirubin: 1.1 mg/dL (ref 0.3–1.2)
Total Protein: 6.6 g/dL (ref 6.5–8.1)

## 2022-03-14 LAB — VITAMIN B12: Vitamin B-12: 1053 pg/mL — ABNORMAL HIGH (ref 180–914)

## 2022-03-14 NOTE — Progress Notes (Signed)
Face to face contact with pt in exam room. Pt is here for follow up with [facility]. Pt reports that she has one more treatment left and is doing well. Pt still has problems with constipation after treatment but is taking medication to help with the problem.

## 2022-03-14 NOTE — Progress Notes (Signed)
[place]  [address] [place],  [place]  [address] [phone]  Clinic Day:  03/14/22    Referring physician: [name], MD  ASSESSMENT & PLAN:   Stage IIB left breast cancer This is a grade 3 infiltrating ductal carcinoma with a T2 N0 M0 measuring 28 mm with 2 negative nodes.  She also has high-grade ductal carcinoma in situ.  Margins are clear.  Estrogen receptors positive at 30% and progesterone receptors are negative with HER2 negative and a Ki-67 of 60%.  I feel she is at higher risk for recurrence and discussed chemotherapy such as TC for 4 cycles.  However we have to take into consideration her advanced age of 82 and whether she would tolerate treatment.  Her EndoPredict score came back at 5.2, correlating with a 48% risk of distant recurrence in the next 10 years. She would have 24% benefit with chemotherapy. She decided to proceed with CMF chemotherapy, and that was started 09/29/2021. We are now completing 6 months of treatment this week.    Osteopenia Bone density scan was done in September 2022 and reveals osteopenia of the hip with a T score of -2.0.  The spine readings were normal.  She was offered Fosamax but declined.  Large seroma/hematoma of the left breast This is stable.  Positive family history for breast cancer She has 2 maternal aunts and a maternal cousin as well as a first paternal cousin all with breast cancer, primarily in their 58s and 49s.  I discussed the option of genetic testing but she seems not inclined to pursue this since she has no children of her own and is 39 years old, so it would not likely change her management.  Neutropenia This resulted in a delay after her first cycle. Her white count has done well since the 20% dose reduction. Her total white count is 3.1 with an [place] of 1.58 last visit, Today's labs are pending..  Mild thrombocytopenia  This is new and very mild.  Today the platelet count is  stable at 125,000. We will monitor.    Plan: We will see her back in 1 month with CBC and CMP and this will be after her final cycle on 03/16/22 .We will discuss hormone therapy for the next visit, and we will then go to every 3 month follow up if all is well.  I have recommended that she continue to monitor and write down her BP readings at home to show to Dr.Burgart to further evaluate. She is scheduled for her annual diagnostic mammogram in March of 2024. She will be due for her bone density scan in September of 2024. We did discuss that she can have her port removed at any time.  I discussed the assessment and treatment plan with the patient.  The patient was provided an opportunity to ask questions and all were answered.  The patient agreed with the plan and demonstrated an understanding of the instructions.  The patient was advised to call back if the symptoms worsen or if the condition fails to improve as anticipated.   ADDENDUM:  The WBC's are better at 3.5 with an [place] of 2.1 and platelets stable at 125,000. Her B12 level is high at 1053. CMP is unremarkable other than a low potassium of 3.4. I will advise her to increase this in her diet.    [name], MD [facility] [place]  [place] [address] Dept: [phone] Dept Fax: [phone]   CHIEF COMPLAINT:  CC: Stage IIB hormone receptor positive breast cancer  Current Treatment: Adjuvant cyclophosphamide/methotrexate/5-fluorouracil every 3 weeks  HISTORY OF PRESENT ILLNESS:  Ashley Villa 82 y.o. female is here because of recent diagnosis of left breast carcinoma. The cancer was detected by screening mammogram on March 6 which showed a possible mass. The cancer was not palpable prior to diagnosis.  She was sent for diagnostic mammogram on March 23 and this confirmed a persistent mass in the slightly upper left breast, middle depth.  An ultrasound  confirmed a 1.6 cm irregular hypoechoic mass at 12:00, 2 cm from the nipple.  She then had a biopsy performed on March 29 which revealed invasive ductal carcinoma.  Estrogen receptors were positive at 30% with progesterone receptors negative and HER2 negative although it did show 1+ on immuno histochemistry.  Ki 67 with 60%.  She was referred to Dr. Jerel Shepherd who performed a lumpectomy on May 10.  The final pathology revealed a grade 3 invasive ductal carcinoma with high-grade ductal carcinoma in situ.  Margins were clear and 2 sentinel nodes were negative.  The final measurement was 28 mm for a T2 N0 M0.  She does have yearly mammograms.   I reviewed her records extensively and collaborated the history with the patient. Oncology History  Breast cancer of upper-outer quadrant of left female breast ([place])  06/01/2021 Initial Diagnosis   Breast cancer of upper-outer quadrant of left female breast ([place])   07/13/2021 Cancer Staging   Staging form: Breast, AJCC 8th Edition - Clinical stage from 07/13/2021: Stage IIB (cT2, cN0(sn), cM0, G3, ER+, PR-, HER2-) - Signed by [name], MD on 08/10/2021 Histopathologic type: Infiltrating duct carcinoma, NOS Stage prefix: Initial diagnosis Method of lymph node assessment: Sentinel lymph node biopsy Nuclear grade: G3 Histologic grading system: 3 grade system Laterality: Left Tumor size (mm): 28 Lymph-vascular invasion (LVI): LVI not present (absent)/not identified Diagnostic confirmation: Positive histology Specimen type: Excision Staged by: Managing physician Percentage of positive estrogen receptors (%): 30 Percentage of positive progesterone receptors (%): 0 HER2-IHC interpretation: Negative HER2-IHC value: Score 1+ Menopausal status: Postmenopausal Ki-67 (%): 60 Stage used in treatment planning: Yes National guidelines used in treatment planning: Yes Type of national guideline used in treatment planning: NCCN   09/29/2021 - 10/27/2021  Chemotherapy   Patient is on Treatment Plan : BREAST Adjuvant CMF IV q21d     09/29/2021 - 03/16/2022 Chemotherapy   Patient is on Treatment Plan : BREAST Adjuvant CMF IV q21d         INTERVAL HISTORY:  Dunia is here today for a routine follow up prior to cycle 8 of CMF, and states she is feeling okay currently. Her blood pressure reading today was 172/106, she does state she checks it at home but says readings are inconsistent. She completes an echocardiogram on April 17, 2022. She states she has been feeling anxious which may be the reason for elevated BP. I have recommended that she write down her readings until she sees Dr.Burgart for further evaluation. I recommend that she sees Dr.Burgart this month. She states that her stomach has been hurting after taking medication, she describes the feeling of discomfort and nausea. She will be seeing a dermatologist 03/21/22 for a spot on her nose. I have let her know that if they were to do any surgical procedures to wait a few weeks due to her chemotherapy. For her next visit we  will discuss hormonal therapy.  We will add B12 to her labs today. She is scheduled for her annual diagnostic mammogram in March. She will be due for her next bone density scan in September of 2024. We discussed that she can have her port removed any time. Her appetite is well and she has lost 1 pound since her last visit. She denies any gastrointestinal or cardiorespiratory symptoms.  She denies fever, chills or other signs of infection. She denies nausea, vomiting, bowel issues, or abdominal pain.  She denies sore throat, cough, dyspnea, or chest pain.  REVIEW OF SYSTEMS:  Review of Systems  Constitutional:  Negative for appetite change, chills, fatigue, fever and unexpected weight change.  HENT:   Negative for lump/mass, mouth sores and sore throat.   Respiratory:  Negative for cough and shortness of breath.   Cardiovascular:  Negative for chest pain and leg swelling.   Gastrointestinal:  Negative for abdominal pain, constipation, diarrhea, nausea and vomiting.  Endocrine: Negative for hot flashes.  Genitourinary:  Negative for difficulty urinating, dysuria, frequency and hematuria.   Musculoskeletal:  Negative for arthralgias, back pain and myalgias.  Skin:  Negative for rash.  Neurological:  Negative for dizziness and headaches.  Hematological:  Negative for adenopathy. Does not bruise/bleed easily.  Psychiatric/Behavioral:  Negative for depression and sleep disturbance. The patient is not nervous/anxious.      VITALS:  Blood pressure (!) 172/106, pulse 61, temperature 97.8 F (36.6 C), temperature source Oral, resp. rate 18, height '5\' 4"'$  (1.626 m), weight 132 lb 9.6 oz (60.1 kg), SpO2 97 %.  Wt Readings from Last 3 Encounters:  03/16/22 132 lb (59.9 kg)  03/14/22 132 lb 9.6 oz (60.1 kg)  02/21/22 134 lb 11.2 oz (61.1 kg)    Body mass index is 22.76 kg/m.  Performance status (ECOG): 0 - Asymptomatic  PHYSICAL EXAM:  Physical Exam Vitals and nursing note reviewed.  Constitutional:      General: She is not in acute distress.    Appearance: Normal appearance.  HENT:     Head: Normocephalic and atraumatic.     Mouth/Throat:     Mouth: Mucous membranes are moist.     Pharynx: Oropharynx is clear. No oropharyngeal exudate or posterior oropharyngeal erythema.  Eyes:     General: No scleral icterus.    Extraocular Movements: Extraocular movements intact.     Conjunctiva/sclera: Conjunctivae normal.     Pupils: Pupils are equal, round, and reactive to light.  Cardiovascular:     Rate and Rhythm: Bradycardia present. Rhythm irregular.     Pulses: Normal pulses.     Heart sounds: Normal heart sounds. No murmur heard.    No friction rub. No gallop.     Comments: Occasional irregular beats,  2/6 systolic murmur Pulmonary:     Effort: Pulmonary effort is normal.     Breath sounds: Normal breath sounds. No wheezing, rhonchi or rales.  Chest:      Comments: Tiny faded scar above the right areolar complex. Her port in right upper chest is normal. Healing scar in the medial aspect of the left breast at about 9 o'clock. She has a firm seroma superior to that, measuring 3-4 cm.  Abdominal:     General: There is no distension.     Palpations: Abdomen is soft. There is no hepatomegaly, splenomegaly or mass.     Tenderness: There is no abdominal tenderness.  Musculoskeletal:        General: Normal range of motion.  Cervical back: Normal range of motion and neck supple. No tenderness.     Right lower leg: No edema.     Left lower leg: No edema.  Lymphadenopathy:     Cervical: No cervical adenopathy.     Upper Body:     Right upper body: No supraclavicular or axillary adenopathy.     Left upper body: No supraclavicular or axillary adenopathy.     Lower Body: No right inguinal adenopathy. No left inguinal adenopathy.  Skin:    General: Skin is warm and dry.     Coloration: Skin is not jaundiced.     Findings: No rash.  Neurological:     Mental Status: She is alert and oriented to person, place, and time.     Cranial Nerves: No cranial nerve deficit.  Psychiatric:        Mood and Affect: Mood normal.        Behavior: Behavior normal.        Thought Content: Thought content normal.    LABS:      Latest Ref Rng & Units 03/14/2022    9:20 AM 02/21/2022   12:00 AM 01/31/2022   12:00 AM  CBC  WBC 4.0 - 10.5 K/uL 3.5  3.1     3.1      Hemoglobin 12.0 - 15.0 g/dL 13.2  13.5     13.3      Hematocrit 36.0 - 46.0 % 40.8  41     40      Platelets 150 - 400 K/uL 125  125     122         This result is from an external source.      Latest Ref Rng & Units 03/14/2022    9:20 AM 02/21/2022   11:02 AM 01/31/2022   10:41 AM  CMP  Glucose 70 - 99 mg/dL 139  115  90   BUN 8 - 23 mg/dL '20  22  20   '$ Creatinine 0.44 - 1.00 mg/dL 0.88  0.90  0.80   Sodium 135 - 145 mmol/L 140  142  139   Potassium 3.5 - 5.1 mmol/L 3.4  4.0  4.0   Chloride  98 - 111 mmol/L 104  106  105   CO2 22 - 32 mmol/L '28  29  29   '$ Calcium 8.9 - 10.3 mg/dL 9.2  9.0  9.2   Total Protein 6.5 - 8.1 g/dL 6.6  6.5  6.9   Total Bilirubin 0.3 - 1.2 mg/dL 1.1  0.9  1.1   Alkaline Phos 38 - 126 U/L 75  63  73   AST 15 - 41 U/L 29  36  31   ALT 0 - 44 U/L 21  33  24      No results found for: "CEA1", "CEA" / No results found for: "CEA1", "CEA" No results found for: "PSA1" No results found for: "GTX646" No results found for: "CAN125"  No results found for: "TOTALPROTELP", "ALBUMINELP", "A1GS", "A2GS", "BETS", "BETA2SER", "GAMS", "MSPIKE", "SPEI" No results found for: "TIBC", "FERRITIN", "IRONPCTSAT" No results found for: "LDH"  STUDIES:  No results found.  EXAM:05/26/21 DIGITAL DIAGNOSTIC UNILATERAL LEFT MAMMOGRAM IMPRESSION: Highly suspicious 1.6 cm upper left breast mass. Tissue sampling is recommending. No abnormal appearing Left axillary lymph nodes.  HISTORY:   Past Medical History:  Diagnosis Date   Family history of breast cancer 02/17/2022   H/O mitral valve repair    HTN (hypertension)  Status post left breast lumpectomy     Past Surgical History:  Procedure Laterality Date   TONSILLECTOMY      Family History  Problem Relation Age of Onset   Cancer Mother    Cancer Maternal Aunt     Social History:  reports that she has never smoked. She has never used smokeless tobacco. She reports that she does not drink alcohol and does not use drugs.The patient is accompanied by her husband today.  Allergies: No Known Allergies  Current Medications: Current Outpatient Medications  Medication Sig Dispense Refill   amoxicillin (AMOXIL) 500 MG capsule Prn prior to dentist visit with hx of mitral valve prolapse (Patient not taking: Reported on 11/29/2021)     Calcium Citrate-Vitamin D 315-5 MG-MCG TABS Take 1 tablet by mouth daily.     Cholecalciferol 25 MCG (1000 UT) capsule Take by mouth.     Coenzyme Q10 50 MG CAPS Take by mouth.      docusate sodium (COLACE) 100 MG capsule Take 100 mg by mouth 2 (two) times daily.     FLUAD QUADRIVALENT 0.5 ML injection      glucosamine-chondroitin 500-400 MG tablet Take 1 tablet by mouth daily.     levocetirizine (XYZAL) 5 MG tablet Take 5 mg by mouth daily.     losartan (COZAAR) 100 MG tablet Take 100 mg by mouth daily.     metoprolol tartrate (LOPRESSOR) 25 MG tablet Take by mouth.     multivitamin-lutein (OCUVITE-LUTEIN) CAPS capsule Take 1 capsule by mouth daily.     ondansetron (ZOFRAN) 8 MG tablet Take 1 tablet (8 mg total) by mouth 2 (two) times daily as needed for refractory nausea / vomiting (Start on day 3 after chemotherapy). 30 tablet 1   prochlorperazine (COMPAZINE) 10 MG tablet Take 1 tablet (10 mg total) by mouth every 6 (six) hours as needed for nausea or vomiting. 30 tablet 1   simvastatin (ZOCOR) 20 MG tablet Take 20 mg by mouth at bedtime.     No current facility-administered medications for this visit.         I,Gabriella Ballesteros,acting as a scribe for [name], MD.,have documented all relevant documentation on the behalf of [name], MD,as directed by  [name], MD while in the presence of [name], MD.

## 2022-03-15 ENCOUNTER — Other Ambulatory Visit: Payer: Self-pay

## 2022-03-15 MED FILL — Dexamethasone Sodium Phosphate Inj 100 MG/10ML: INTRAMUSCULAR | Qty: 1 | Status: AC

## 2022-03-15 MED FILL — Cyclophosphamide For Inj 1 GM: INTRAMUSCULAR | Qty: 34 | Status: AC

## 2022-03-15 MED FILL — Fluorouracil IV Soln 2.5 GM/50ML (50 MG/ML): INTRAVENOUS | Qty: 13 | Status: AC

## 2022-03-15 MED FILL — Methotrexate Sodium Inj PF 50 MG/2ML (25 MG/ML): INTRAMUSCULAR | Qty: 1.6 | Status: AC

## 2022-03-16 ENCOUNTER — Inpatient Hospital Stay: Payer: Medicare HMO

## 2022-03-16 ENCOUNTER — Telehealth: Payer: Self-pay

## 2022-03-16 VITALS — BP 121/72 | HR 59 | Temp 98.0°F | Resp 18 | Ht 64.0 in | Wt 132.0 lb

## 2022-03-16 DIAGNOSIS — D696 Thrombocytopenia, unspecified: Secondary | ICD-10-CM | POA: Diagnosis not present

## 2022-03-16 DIAGNOSIS — C50412 Malignant neoplasm of upper-outer quadrant of left female breast: Secondary | ICD-10-CM

## 2022-03-16 DIAGNOSIS — Z5111 Encounter for antineoplastic chemotherapy: Secondary | ICD-10-CM | POA: Diagnosis not present

## 2022-03-16 DIAGNOSIS — M85859 Other specified disorders of bone density and structure, unspecified thigh: Secondary | ICD-10-CM | POA: Diagnosis not present

## 2022-03-16 DIAGNOSIS — Z170 Estrogen receptor positive status [ER+]: Secondary | ICD-10-CM | POA: Diagnosis not present

## 2022-03-16 DIAGNOSIS — Z79899 Other long term (current) drug therapy: Secondary | ICD-10-CM | POA: Diagnosis not present

## 2022-03-16 MED ORDER — SODIUM CHLORIDE 0.9 % IV SOLN
10.0000 mg | Freq: Once | INTRAVENOUS | Status: AC
  Administered 2022-03-16: 10 mg via INTRAVENOUS
  Filled 2022-03-16: qty 10

## 2022-03-16 MED ORDER — SODIUM CHLORIDE 0.9% FLUSH
10.0000 mL | INTRAVENOUS | Status: DC | PRN
  Administered 2022-03-16: 10 mL

## 2022-03-16 MED ORDER — PALONOSETRON HCL INJECTION 0.25 MG/5ML
0.2500 mg | Freq: Once | INTRAVENOUS | Status: AC
  Administered 2022-03-16: 0.25 mg via INTRAVENOUS
  Filled 2022-03-16: qty 5

## 2022-03-16 MED ORDER — FLUOROURACIL CHEMO INJECTION 2.5 GM/50ML
400.0000 mg/m2 | Freq: Once | INTRAVENOUS | Status: AC
  Administered 2022-03-16: 650 mg via INTRAVENOUS
  Filled 2022-03-16: qty 13

## 2022-03-16 MED ORDER — SODIUM CHLORIDE 0.9 % IV SOLN
400.0000 mg/m2 | Freq: Once | INTRAVENOUS | Status: AC
  Administered 2022-03-16: 680 mg via INTRAVENOUS
  Filled 2022-03-16: qty 25

## 2022-03-16 MED ORDER — HEPARIN SOD (PORK) LOCK FLUSH 100 UNIT/ML IV SOLN
500.0000 [IU] | Freq: Once | INTRAVENOUS | Status: AC | PRN
  Administered 2022-03-16: 500 [IU]

## 2022-03-16 MED ORDER — SODIUM CHLORIDE 0.9 % IV SOLN: Freq: Once | INTRAVENOUS | Status: AC

## 2022-03-16 MED ORDER — METHOTREXATE SODIUM CHEMO INJECTION (PF) 50 MG/2ML
23.7500 mg/m2 | Freq: Once | INTRAMUSCULAR | Status: AC
  Administered 2022-03-16: 40 mg via INTRAVENOUS
  Filled 2022-03-16: qty 1.6

## 2022-03-16 NOTE — Telephone Encounter (Signed)
Attempted to contact patient. No answer and no VM.  

## 2022-03-16 NOTE — Telephone Encounter (Signed)
-----   Message from [name], MD sent at 03/15/2022  5:47 PM EST ----- Regarding: call Tell her WBC mildly low and K mildly low, otherwise labs are good, and she has plenty of infection fighting cells. I rec she increase K in her diet and we'll recheck when she returns.

## 2022-03-16 NOTE — Patient Instructions (Signed)
Fluorouracil Injection What is this medication? FLUOROURACIL (flure oh YOOR a sil) treats some types of cancer. It works by slowing down the growth of cancer cells. This medicine may be used for other purposes; ask your health care provider or pharmacist if you have questions. COMMON BRAND NAME(S): Adrucil What should I tell my care team before I take this medication? They need to know if you have any of these conditions: Blood disorders Dihydropyrimidine dehydrogenase (DPD) deficiency Infection, such as chickenpox, cold sores, herpes Kidney disease Liver disease Poor nutrition Recent or ongoing radiation therapy An unusual or allergic reaction to fluorouracil, other medications, foods, dyes, or preservatives If you or your partner are pregnant or trying to get pregnant Breast-feeding How should I use this medication? This medication is injected into a vein. It is administered by your care team in a hospital or clinic setting. Talk to your care team about the use of this medication in children. Special care may be needed. Overdosage: If you think you have taken too much of this medicine contact a poison control center or emergency room at once. NOTE: This medicine is only for you. Do not share this medicine with others. What if I miss a dose? Keep appointments for follow-up doses. It is important not to miss your dose. Call your care team if you are unable to keep an appointment. What may interact with this medication? Do not take this medication with any of the following: Live virus vaccines This medication may also interact with the following: Medications that treat or prevent blood clots, such as warfarin, enoxaparin, dalteparin This list may not describe all possible interactions. Give your health care provider a list of all the medicines, herbs, non-prescription drugs, or dietary supplements you use. Also tell them if you smoke, drink alcohol, or use illegal drugs. Some items may  interact with your medicine. What should I watch for while using this medication? Your condition will be monitored carefully while you are receiving this medication. This medication may make you feel generally unwell. This is not uncommon as chemotherapy can affect healthy cells as well as cancer cells. Report any side effects. Continue your course of treatment even though you feel ill unless your care team tells you to stop. In some cases, you may be given additional medications to help with side effects. Follow all directions for their use. This medication may increase your risk of getting an infection. Call your care team for advice if you get a fever, chills, sore throat, or other symptoms of a cold or flu. Do not treat yourself. Try to avoid being around people who are sick. This medication may increase your risk to bruise or bleed. Call your care team if you notice any unusual bleeding. Be careful brushing or flossing your teeth or using a toothpick because you may get an infection or bleed more easily. If you have any dental work done, tell your dentist you are receiving this medication. Avoid taking medications that contain aspirin, acetaminophen, ibuprofen, naproxen, or ketoprofen unless instructed by your care team. These medications may hide a fever. Do not treat diarrhea with over the counter products. Contact your care team if you have diarrhea that lasts more than 2 days or if it is severe and watery. This medication can make you more sensitive to the sun. Keep out of the sun. If you cannot avoid being in the sun, wear protective clothing and sunscreen. Do not use sun lamps, tanning beds, or tanning booths. Talk to   your care team if you or your partner wish to become pregnant or think you might be pregnant. This medication can cause serious birth defects if taken during pregnancy and for 3 months after the last dose. A reliable form of contraception is recommended while taking this  medication and for 3 months after the last dose. Talk to your care team about effective forms of contraception. Do not father a child while taking this medication and for 3 months after the last dose. Use a condom while having sex during this time period. Do not breastfeed while taking this medication. This medication may cause infertility. Talk to your care team if you are concerned about your fertility. What side effects may I notice from receiving this medication? Side effects that you should report to your care team as soon as possible: Allergic reactions--skin rash, itching, hives, swelling of the face, lips, tongue, or throat Heart attack--pain or tightness in the chest, shoulders, arms, or jaw, nausea, shortness of breath, cold or clammy skin, feeling faint or lightheaded Heart failure--shortness of breath, swelling of the ankles, feet, or hands, sudden weight gain, unusual weakness or fatigue Heart rhythm changes--fast or irregular heartbeat, dizziness, feeling faint or lightheaded, chest pain, trouble breathing High ammonia level--unusual weakness or fatigue, confusion, loss of appetite, nausea, vomiting, seizures Infection--fever, chills, cough, sore throat, wounds that don't heal, pain or trouble when passing urine, general feeling of discomfort or being unwell Low red blood cell level--unusual weakness or fatigue, dizziness, headache, trouble breathing Pain, tingling, or numbness in the hands or feet, muscle weakness, change in vision, confusion or trouble speaking, loss of balance or coordination, trouble walking, seizures Redness, swelling, and blistering of the skin over hands and feet Severe or prolonged diarrhea Unusual bruising or bleeding Side effects that usually do not require medical attention (report to your care team if they continue or are bothersome): Dry skin Headache Increased tears Nausea Pain, redness, or swelling with sores inside the mouth or throat Sensitivity  to light Vomiting This list may not describe all possible side effects. Call your doctor for medical advice about side effects. You may report side effects to FDA at 1-800-FDA-1088. Where should I keep my medication? This medication is given in a hospital or clinic. It will not be stored at home. NOTE: This sheet is a summary. It may not cover all possible information. If you have questions about this medicine, talk to your doctor, pharmacist, or health care provider.  2023 [employer]/Gold Standard (2021-06-21 00:00:00) Cyclophosphamide Injection What is this medication? CYCLOPHOSPHAMIDE (sye kloe FOSS fa mide) treats some types of cancer. It works by slowing down the growth of cancer cells. This medicine may be used for other purposes; ask your health care provider or pharmacist if you have questions. COMMON BRAND NAME(S): Cyclophosphamide, Cytoxan, Neosar What should I tell my care team before I take this medication? They need to know if you have any of these conditions: Heart disease Irregular heartbeat or rhythm Infection Kidney problems Liver disease Low blood cell levels (white cells, platelets, or red blood cells) Lung disease Previous radiation Trouble passing urine An unusual or allergic reaction to cyclophosphamide, other medications, foods, dyes, or preservatives Pregnant or trying to get pregnant Breast-feeding How should I use this medication? This medication is injected into a vein. It is given by your care team in a hospital or clinic setting. Talk to your care team about the use of this medication in children. Special care may be needed. Overdosage: If you  think you have taken too much of this medicine contact a poison control center or emergency room at once. NOTE: This medicine is only for you. Do not share this medicine with others. What if I miss a dose? Keep appointments for follow-up doses. It is important not to miss your dose. Call your care team if you are  unable to keep an appointment. What may interact with this medication? Amphotericin B Amiodarone Azathioprine Certain antivirals for HIV or hepatitis Certain medications for blood pressure, such as enalapril, lisinopril, quinapril Cyclosporine Diuretics Etanercept Indomethacin Medications that relax muscles Metronidazole Natalizumab Tamoxifen Warfarin This list may not describe all possible interactions. Give your health care provider a list of all the medicines, herbs, non-prescription drugs, or dietary supplements you use. Also tell them if you smoke, drink alcohol, or use illegal drugs. Some items may interact with your medicine. What should I watch for while using this medication? This medication may make you feel generally unwell. This is not uncommon as chemotherapy can affect healthy cells as well as cancer cells. Report any side effects. Continue your course of treatment even though you feel ill unless your care team tells you to stop. You may need blood work while you are taking this medication. This medication may increase your risk of getting an infection. Call your care team for advice if you get a fever, chills, sore throat, or other symptoms of a cold or flu. Do not treat yourself. Try to avoid being around people who are sick. Avoid taking medications that contain aspirin, acetaminophen, ibuprofen, naproxen, or ketoprofen unless instructed by your care team. These medications may hide a fever. Be careful brushing or flossing your teeth or using a toothpick because you may get an infection or bleed more easily. If you have any dental work done, tell your dentist you are receiving this medication. Drink water or other fluids as directed. Urinate often, even at night. Some products may contain alcohol. Ask your care team if this medication contains alcohol. Be sure to tell all care teams you are taking this medicine. Certain medicines, like metronidazole and disulfiram, can cause  an unpleasant reaction when taken with alcohol. The reaction includes flushing, headache, nausea, vomiting, sweating, and increased thirst. The reaction can last from 30 minutes to several hours. Talk to your care team if you wish to become pregnant or think you might be pregnant. This medication can cause serious birth defects if taken during pregnancy and for 1 year after the last dose. A negative pregnancy test is required before starting this medication. A reliable form of contraception is recommended while taking this medication and for 1 year after the last dose. Talk to your care team about reliable forms of contraception. Do not father a child while taking this medication and for 4 months after the last dose. Use a condom during this time period. Do not breast-feed while taking this medication or for 1 week after the last dose. This medication may cause infertility. Talk to your care team if you are concerned about your fertility. Talk to your care team about your risk of cancer. You may be more at risk for certain types of cancer if you take this medication. What side effects may I notice from receiving this medication? Side effects that you should report to your care team as soon as possible: Allergic reactions--skin rash, itching, hives, swelling of the face, lips, tongue, or throat Dry cough, shortness of breath or trouble breathing Heart failure--shortness of breath, swelling  of the ankles, feet, or hands, sudden weight gain, unusual weakness or fatigue Heart muscle inflammation--unusual weakness or fatigue, shortness of breath, chest pain, fast or irregular heartbeat, dizziness, swelling of the ankles, feet, or hands Heart rhythm changes--fast or irregular heartbeat, dizziness, feeling faint or lightheaded, chest pain, trouble breathing Infection--fever, chills, cough, sore throat, wounds that don't heal, pain or trouble when passing urine, general feeling of discomfort or being  unwell Kidney injury--decrease in the amount of urine, swelling of the ankles, hands, or feet Liver injury--right upper belly pain, loss of appetite, nausea, light-colored stool, dark yellow or brown urine, yellowing skin or eyes, unusual weakness or fatigue Low red blood cell level--unusual weakness or fatigue, dizziness, headache, trouble breathing Low sodium level--muscle weakness, fatigue, dizziness, headache, confusion Red or dark brown urine Unusual bruising or bleeding Side effects that usually do not require medical attention (report to your care team if they continue or are bothersome): Hair loss Irregular menstrual cycles or spotting Loss of appetite Nausea Pain, redness, or swelling with sores inside the mouth or throat Vomiting This list may not describe all possible side effects. Call your doctor for medical advice about side effects. You may report side effects to FDA at 1-800-FDA-1088. Where should I keep my medication? This medication is given in a hospital or clinic. It will not be stored at home. NOTE: This sheet is a summary. It may not cover all possible information. If you have questions about this medicine, talk to your doctor, pharmacist, or health care provider.  2023 [employer]/Gold Standard (2021-04-12 00:00:00) Methotrexate Injection What is this medication? METHOTREXATE (METH oh TREX ate) treats inflammatory conditions such as arthritis and psoriasis. It works by decreasing inflammation, which can reduce pain and prevent long-term injury to the joints and skin. It may also be used to treat some types of cancer. It works by slowing down the growth of cancer cells. This medicine may be used for other purposes; ask your health care provider or pharmacist if you have questions. What should I tell my care team before I take this medication? They need to know if you have any of these conditions: Fluid in the stomach area or lungs If you often drink alcohol Infection or  immune system problems Kidney disease Liver disease Low blood counts (white cells, platelets, or red blood cells) Lung disease Recent or ongoing radiation Recent or upcoming vaccine Stomach ulcers Ulcerative colitis An unusual or allergic reaction to methotrexate, other medications, foods, dyes, or preservatives Pregnant or trying to get pregnant Breast-feeding How should I use this medication? This medication is for infusion into a vein or for injection into muscle or into the spinal fluid (whichever applies). It is usually given in a hospital or clinic setting. In rare cases, you might get this medication at home. You will be taught how to give this medication. Use exactly as directed. Take your medication at regular intervals. Do not take your medication more often than directed. If this medication is used for arthritis or psoriasis, it should be taken weekly, NOT daily. It is important that you put your used needles and syringes in a special sharps container. Do not put them in a trash can. If you do not have a sharps container, call your pharmacist or care team to get one. Talk to your care team about the use of this medication in children. While this medication may be prescribed for children as young as 2 years for selected conditions, precautions do apply. Overdosage: If you  think you have taken too much of this medicine contact a poison control center or emergency room at once. NOTE: This medicine is only for you. Do not share this medicine with others. What if I miss a dose? It is important not to miss your dose. Call your care team if you are unable to keep an appointment. If you give yourself the medication, and you miss a dose, talk with your care team. Do not take double or extra doses. What may interact with this medication? Do not take this medication with any of the following: Acitretin This medication may also interact with the following: Aspirin or aspirin-like medications  including salicylates Azathioprine Certain antibiotics like chloramphenicol, penicillin, tetracycline Certain medications that treat or prevent blood clots like warfarin, apixaban, dabigatran, and rivaroxaban Certain medications for stomach problems like esomeprazole, omeprazole, pantoprazole Cyclosporine Dapsone Diuretics Folic acid Gold Hydroxychloroquine Live virus vaccines Medications for infection like acyclovir, adefovir, amphotericin B, bacitracin, cidofovir, foscarnet, ganciclovir, gentamicin, pentamidine, vancomycin Mercaptopurine NSAIDs, medications for pain and inflammation, like ibuprofen or naproxen Pamidronate Pemetrexed Penicillamine Phenylbutazone Phenytoin Probenecid Pyrimethamine Retinoids such as isotretinoin and tretinoin Steroid medications like prednisone or cortisone Sulfonamides like sulfasalazine and trimethoprim/sulfamethoxazole Theophylline Zoledronic acid This list may not describe all possible interactions. Give your health care provider a list of all the medicines, herbs, non-prescription drugs, or dietary supplements you use. Also tell them if you smoke, drink alcohol, or use illegal drugs. Some items may interact with your medicine. What should I watch for while using this medication? This medication may make you feel generally unwell. This is not uncommon as chemotherapy can affect healthy cells as well as cancer cells. Report any side effects. Continue your course of treatment even though you feel ill unless your care team tells you to stop. Your condition will be monitored carefully while you are receiving this medication. Avoid alcoholic drinks. This medication can cause serious side effects. To reduce the risk, your care team may give you other medications to take before receiving this one. Be sure to follow the directions from your care team. This medication can make you more sensitive to the sun. Keep out of the sun. If you cannot avoid being in  the sun, wear protective clothing and use sunscreen. Do not use sun lamps or tanning beds/booths. You may get drowsy or dizzy. Do not drive, use machinery, or do anything that needs mental alertness until you know how this medication affects you. Do not stand or sit up quickly, especially if you are an older patient. This reduces the risk of dizzy or fainting spells. You may need blood work while you are taking this medication. Call your care team for advice if you get a fever, chills or sore throat, or other symptoms of a cold or flu. Do not treat yourself. This medication decreases your body's ability to fight infections. Try to avoid being around people who are sick. This medication may increase your risk to bruise or bleed. Call your care team if you notice any unusual bleeding. Be careful brushing or flossing your teeth or using a toothpick because you may get an infection or bleed more easily. If you have any dental work done, tell your dentist you are receiving this medication Check with your care team if you get an attack of severe diarrhea, nausea and vomiting, or if you sweat a lot. The loss of too much body fluid can make it dangerous for you to take this medication. Talk to your care team  about your risk of cancer. You may be more at risk for certain types of cancers if you take this medication. Do not become pregnant while taking this medication or for 6 months after stopping it. Women should inform their care team if they wish to become pregnant or think they might be pregnant. Men should not father a child while taking this medication and for 3 months after stopping it. There is potential for serious harm to an unborn child. Talk to your care team for more information. Do not breast-feed an infant while taking this medication or for 1 week after stopping it. This medication may make it more difficult to get pregnant or father a child. Talk to your care team if you are concerned about your  fertility. What side effects may I notice from receiving this medication? Side effects that you should report to your care team as soon as possible: Allergic reactions--skin rash, itching, hives, swelling of the face, lips, tongue, or throat Blood clot--pain, swelling, or warmth in the leg, shortness of breath, chest pain Dry cough, shortness of breath or trouble breathing Infection--fever, chills, cough, sore throat, wounds that don't heal, pain or trouble when passing urine, general feeling of discomfort or being unwell Kidney injury--decrease in the amount of urine, swelling of the ankles, hands, or feet Liver injury--right upper belly pain, loss of appetite, nausea, light-colored stool, dark yellow or brown urine, yellowing of the skin or eyes, unusual weakness or fatigue Low red blood cell count--unusual weakness or fatigue, dizziness, headache, trouble breathing Redness, blistering, peeling, or loosening of the skin, including inside the mouth Seizures Unusual bruising or bleeding Side effects that usually do not require medical attention (report to your care team if they continue or are bothersome): Diarrhea Dizziness Hair loss Nausea Pain, redness, or swelling with sores inside the mouth or throat Vomiting This list may not describe all possible side effects. Call your doctor for medical advice about side effects. You may report side effects to FDA at 1-800-FDA-1088. Where should I keep my medication? This medication is given in a hospital or clinic. It will not be stored at home. NOTE: This sheet is a summary. It may not cover all possible information. If you have questions about this medicine, talk to your doctor, pharmacist, or health care provider.  2023 [employer]vier/Gold Standard (2020-04-26 00:00:00)

## 2022-03-17 ENCOUNTER — Telehealth: Payer: Self-pay

## 2022-03-17 ENCOUNTER — Other Ambulatory Visit: Payer: Self-pay | Admitting: Oncology

## 2022-03-17 NOTE — Telephone Encounter (Signed)
-----  Message from [name], MD sent at 03/15/2022  5:47 PM EST ----- Regarding: call Tell her WBC mildly low and K mildly low, otherwise labs are good, and she has plenty of infection fighting cells. I rec she increase K in her diet and we'll recheck when she returns.

## 2022-03-17 NOTE — Telephone Encounter (Signed)
Patient notified of lab results and to increase potassium in her diet. Patient voiced her understanding

## 2022-03-21 DIAGNOSIS — L578 Other skin changes due to chronic exposure to nonionizing radiation: Secondary | ICD-10-CM | POA: Diagnosis not present

## 2022-03-21 DIAGNOSIS — L814 Other melanin hyperpigmentation: Secondary | ICD-10-CM | POA: Diagnosis not present

## 2022-03-21 DIAGNOSIS — I781 Nevus, non-neoplastic: Secondary | ICD-10-CM | POA: Diagnosis not present

## 2022-04-13 NOTE — Progress Notes (Signed)
[name]  [address] [place],  [place]  [address] [phone]  Clinic Day:  04/18/22   Referring physician: [name], MD  ASSESSMENT & PLAN:   Stage IIB left breast cancer This is a grade 3 infiltrating ductal carcinoma with a T2 N0 M0 measuring 28 mm with 2 negative nodes.  She also has high-grade ductal carcinoma in situ.  Margins are clear.  Estrogen receptors positive at 30% and progesterone receptors are negative with HER2 negative and a Ki-67 of 60%.  I feel she is at higher risk for recurrence and discussed chemotherapy such as TC for 4 cycles.  However we have to take into consideration her advanced age of 25 and whether she would tolerate treatment.  Her EndoPredict score came back at 5.2, correlating with a 48% risk of distant recurrence in the next 10 years. She would have 24% benefit with chemotherapy. She decided to proceed with CMF chemotherapy, and that was started 09/29/2021. She now completed 6 months of treatment in mid January.   Osteopenia Bone density scan was done in September 2022 and reveals osteopenia of the hip with a T score of -2.0.  The spine readings were normal.  She was offered Fosamax but declined.  Large seroma/hematoma of the left breast This is stable.  Positive family history for breast cancer She has 2 maternal aunts and a maternal cousin as well as a first paternal cousin all with breast cancer, primarily in their 34's and 47's.  I discussed the option of genetic testing but she seems not inclined to pursue this since she has no children of her own and is 75 years old, so it would not likely change her management.  Hypertension Her blood pressure is too high today but she tells me it fluctuates up and down. She will monitor this daily and follow up with Dr.Burgart. If her readings remain high, she needs to see her sooner.     Plan: Labs from today are pending. We will see her back in 3 months with CBC  and CMP. I have recommended that she continue to monitor and write down her BP readings at home to show to Dr.Burgart to further evaluate. We did discuss hormone therapy which she will start Anastrozole 1 mg daily, 1 pill a day for the next 5 years.  She is scheduled for her annual diagnostic mammogram in March of 2024. She will be due for her bone density scan in September of 2024. I discussed the assessment and treatment plan with the patient.  The patient was provided an opportunity to ask questions and all were answered.  The patient agreed with the plan and demonstrated an understanding of the instructions.  The patient was advised to call back if the symptoms worsen or if the condition fails to improve as anticipated.     Ashley Kaplan, MD [facility] [address] [place] [address] Dept: [phone] Dept Fax: [phone]   CHIEF COMPLAINT:  CC: Stage IIB hormone receptor positive breast cancer  Current Treatment: Adjuvant cyclophosphamide/methotrexate/5-fluorouracil every 3 weeks  HISTORY OF PRESENT ILLNESS:  Ashley Villa 82 y.o. female is here because of recent diagnosis of left breast carcinoma. The cancer was detected by screening mammogram on March 6 which showed a possible mass. The cancer was not palpable prior to diagnosis.  She was sent for diagnostic mammogram on March 23 and this confirmed a persistent mass  in the slightly upper left breast, middle depth.  An ultrasound confirmed a 1.6 cm irregular hypoechoic mass at 12:00, 2 cm from the nipple.  She then had a biopsy performed on March 29 which revealed invasive ductal carcinoma.  Estrogen receptors were positive at 30% with progesterone receptors negative and HER2 negative although it did show 1+ on immuno histochemistry.  Ki 67 with 60%.  She was referred to Dr. Jerel Shepherd who performed a lumpectomy on May 10.  The final pathology revealed a  grade 3 invasive ductal carcinoma with high-grade ductal carcinoma in situ.  Margins were clear and 2 sentinel nodes were negative.  The final measurement was 28 mm for a T2 N0 M0.  She does have yearly mammograms.   I reviewed her records extensively and collaborated the history with the patient. Oncology History  Breast cancer of upper-outer quadrant of left female breast ([facility])  06/01/2021 Initial Diagnosis   Breast cancer of upper-outer quadrant of left female breast ([facility])   07/13/2021 Cancer Staging   Staging form: Breast, AJCC 8th Edition - Clinical stage from 07/13/2021: Stage IIB (cT2, cN0(sn), cM0, G3, ER+, PR-, HER2-) - Signed by Ashley Kaplan, MD on 08/10/2021 Histopathologic type: Infiltrating duct carcinoma, NOS Stage prefix: Initial diagnosis Method of lymph node assessment: Sentinel lymph node biopsy Nuclear grade: G3 Histologic grading system: 3 grade system Laterality: Left Tumor size (mm): 28 Lymph-vascular invasion (LVI): LVI not present (absent)/not identified Diagnostic confirmation: Positive histology Specimen type: Excision Staged by: Managing physician Percentage of positive estrogen receptors (%): 30 Percentage of positive progesterone receptors (%): 0 HER2-IHC interpretation: Negative HER2-IHC value: Score 1+ Menopausal status: Postmenopausal Ki-67 (%): 60 Stage used in treatment planning: Yes National guidelines used in treatment planning: Yes Type of national guideline used in treatment planning: NCCN   09/29/2021 - 10/27/2021 Chemotherapy   Patient is on Treatment Plan : BREAST Adjuvant CMF IV q21d     09/29/2021 - 03/16/2022 Chemotherapy   Patient is on Treatment Plan : BREAST Adjuvant CMF IV q21d         INTERVAL HISTORY:  Ashley Villa is here today for a routine follow up after completing 8 cycles of CMF mid January, and states she is feeling okay currently. She states she has had shortness of breath, and noticed this for about a week. She had an  echocardiogram yesterday. She is still awaiting results from this. She continues taking the metoprolol 25 mg, 1 in the morning and 2 at night. She also continues taking losartan 100 mg. We did discuss hormone therapy which she will start Anastrozole 1 mg daily, 1 pill a day for the next 5 years. She has some reluctance to start this and I have reviewed the schedule and potential toxicities. She is willing to try it.She is scheduled for her annual diagnostic mammogram in March 7th. She will see Dr.Lininger 2 weeks after that and also discuss removal of her port. She will be due for her next bone density scan in September of 2024. Her appetite is well and she has gained 2 pounds since her last visit. She denies any gastrointestinal symptoms.  She denies fever, chills or other signs of infection. She denies nausea, vomiting, bowel issues, or abdominal pain.  She denies sore throat, cough, dyspnea, or chest pain.  REVIEW OF SYSTEMS:  Review of Systems  Constitutional:  Negative for appetite change, chills, fatigue, fever and unexpected weight change.  HENT:   Negative for lump/mass, mouth sores and sore throat.  Respiratory:  Negative for cough and shortness of breath.   Cardiovascular:  Negative for chest pain and leg swelling.  Gastrointestinal:  Negative for abdominal pain, constipation, diarrhea, nausea and vomiting.  Endocrine: Negative for hot flashes.  Genitourinary:  Negative for difficulty urinating, dysuria, frequency and hematuria.   Musculoskeletal:  Negative for arthralgias, back pain and myalgias.  Skin:  Negative for rash.  Neurological:  Negative for dizziness and headaches.  Hematological:  Negative for adenopathy. Does not bruise/bleed easily.  Psychiatric/Behavioral:  Negative for depression and sleep disturbance. The patient is not nervous/anxious.      VITALS:  Blood pressure (!) 160/98, pulse 61, temperature 97.8 F (36.6 C), temperature source Oral, resp. rate 20, height '5\' 4"'$   (1.626 m), weight 134 lb 3.2 oz (60.9 kg), SpO2 97 %.  Wt Readings from Last 3 Encounters:  04/18/22 134 lb 3.2 oz (60.9 kg)  03/16/22 132 lb (59.9 kg)  03/14/22 132 lb 9.6 oz (60.1 kg)    Body mass index is 23.04 kg/m.  Performance status (ECOG): 0 - Asymptomatic  PHYSICAL EXAM:  Physical Exam Vitals and nursing note reviewed.  Constitutional:      General: She is not in acute distress.    Appearance: Normal appearance.  HENT:     Head: Normocephalic and atraumatic.     Mouth/Throat:     Mouth: Mucous membranes are moist.     Pharynx: Oropharynx is clear. No oropharyngeal exudate or posterior oropharyngeal erythema.  Eyes:     General: No scleral icterus.    Extraocular Movements: Extraocular movements intact.     Conjunctiva/sclera: Conjunctivae normal.     Pupils: Pupils are equal, round, and reactive to light.  Cardiovascular:     Rate and Rhythm: Bradycardia present. Rhythm irregular.     Pulses: Normal pulses.     Heart sounds: Normal heart sounds. No murmur heard.    No friction rub. No gallop.     Comments: Occasional irregular beats,  2/6 systolic murmur Pulmonary:     Effort: Pulmonary effort is normal.     Breath sounds: Normal breath sounds. No wheezing, rhonchi or rales.  Chest:     Comments: A well healed scar in the upper outer quadrant of the right breast adjacent to the right superior areolar complex on the left side she has a firm scar in the upper inner quadrant of the left breast with a seroma which is slowly shrinking but still firm.  Abdominal:     General: There is no distension.     Palpations: Abdomen is soft. There is no hepatomegaly, splenomegaly or mass.     Tenderness: There is no abdominal tenderness.  Musculoskeletal:        General: Normal range of motion.     Cervical back: Normal range of motion and neck supple. No tenderness.     Right lower leg: No edema.     Left lower leg: No edema.  Lymphadenopathy:     Cervical: No cervical  adenopathy.     Upper Body:     Right upper body: No supraclavicular or axillary adenopathy.     Left upper body: No supraclavicular or axillary adenopathy.     Lower Body: No right inguinal adenopathy. No left inguinal adenopathy.  Skin:    General: Skin is warm and dry.     Coloration: Skin is not jaundiced.     Findings: No rash.  Neurological:     Mental Status: She is alert and oriented  to person, place, and time.     Cranial Nerves: No cranial nerve deficit.  Psychiatric:        Mood and Affect: Mood normal.        Behavior: Behavior normal.        Thought Content: Thought content normal.    LABS:      Latest Ref Rng & Units 04/18/2022    9:25 AM 03/14/2022    9:20 AM 02/21/2022   12:00 AM  CBC  WBC 4.0 - 10.5 K/uL 6.0  3.5  3.1      Hemoglobin 12.0 - 15.0 g/dL 13.5  13.2  13.5      Hematocrit 36.0 - 46.0 % 42.7  40.8  41      Platelets 150 - 400 K/uL 122  125  125         This result is from an external source.      Latest Ref Rng & Units 04/18/2022    9:25 AM 03/14/2022    9:20 AM 02/21/2022   11:02 AM  CMP  Glucose 70 - 99 mg/dL 100  139  115   BUN 8 - 23 mg/dL '24  20  22   '$ Creatinine 0.44 - 1.00 mg/dL 0.86  0.88  0.90   Sodium 135 - 145 mmol/L 142  140  142   Potassium 3.5 - 5.1 mmol/L 3.6  3.4  4.0   Chloride 98 - 111 mmol/L 106  104  106   CO2 22 - 32 mmol/L '29  28  29   '$ Calcium 8.9 - 10.3 mg/dL 9.0  9.2  9.0   Total Protein 6.5 - 8.1 g/dL 6.8  6.6  6.5   Total Bilirubin 0.3 - 1.2 mg/dL 1.3  1.1  0.9   Alkaline Phos 38 - 126 U/L 65  75  63   AST 15 - 41 U/L 44  29  36   ALT 0 - 44 U/L 42  21  33      No results found for: "CEA1", "CEA" / No results found for: "CEA1", "CEA" No results found for: "PSA1" No results found for: "[id number]" No results found for: "CAN125"  No results found for: "TOTALPROTELP", "ALBUMINELP", "A1GS", "A2GS", "BETS", "BETA2SER", "GAMS", "MSPIKE", "SPEI" No results found for: "TIBC", "FERRITIN", "IRONPCTSAT" No results found for:  "LDH"  STUDIES:  No results found.  EXAM:05/26/21 DIGITAL DIAGNOSTIC UNILATERAL LEFT MAMMOGRAM IMPRESSION: Highly suspicious 1.6 cm upper left breast mass. Tissue sampling is recommending. No abnormal appearing Left axillary lymph nodes.  HISTORY:   Past Medical History:  Diagnosis Date   Family history of breast cancer 02/17/2022   H/O mitral valve repair    HTN (hypertension)    Status post left breast lumpectomy     Past Surgical History:  Procedure Laterality Date   TONSILLECTOMY      Family History  Problem Relation Age of Onset   Cancer Mother    Cancer Maternal Aunt     Social History:  reports that she has never smoked. She has never used smokeless tobacco. She reports that she does not drink alcohol and does not use drugs.The patient is accompanied by her husband today.  Allergies: No Known Allergies  Current Medications: Current Outpatient Medications  Medication Sig Dispense Refill   amoxicillin (AMOXIL) 500 MG capsule Prn prior to dentist visit with hx of mitral valve prolapse (Patient not taking: Reported on 11/29/2021)     anastrozole (ARIMIDEX) 1 MG tablet Take 1 tablet (  1 mg total) by mouth daily. 90 tablet 3   Calcium Citrate-Vitamin D 315-5 MG-MCG TABS Take 1 tablet by mouth daily.     Cholecalciferol 25 MCG (1000 UT) capsule Take by mouth.     Coenzyme Q10 50 MG CAPS Take by mouth.     docusate sodium (COLACE) 100 MG capsule Take 100 mg by mouth 2 (two) times daily.     FLUAD QUADRIVALENT 0.5 ML injection      glucosamine-chondroitin 500-400 MG tablet Take 1 tablet by mouth daily.     levocetirizine (XYZAL) 5 MG tablet Take 5 mg by mouth daily.     losartan (COZAAR) 100 MG tablet Take 100 mg by mouth daily.     metoprolol tartrate (LOPRESSOR) 25 MG tablet Take by mouth.     multivitamin-lutein (OCUVITE-LUTEIN) CAPS capsule Take 1 capsule by mouth daily.     ondansetron (ZOFRAN) 8 MG tablet Take 1 tablet (8 mg total) by mouth 2 (two) times daily as  needed for refractory nausea / vomiting (Start on day 3 after chemotherapy). 30 tablet 1   prochlorperazine (COMPAZINE) 10 MG tablet Take 1 tablet (10 mg total) by mouth every 6 (six) hours as needed for nausea or vomiting. 30 tablet 1   simvastatin (ZOCOR) 20 MG tablet Take 20 mg by mouth at bedtime.     No current facility-administered medications for this visit.         I,Gabriella Ballesteros,acting as a scribe for Ashley Kaplan, MD.,have documented all relevant documentation on the behalf of Ashley Kaplan, MD,as directed by  Ashley Kaplan, MD while in the presence of Ashley Kaplan, MD.

## 2022-04-17 DIAGNOSIS — I480 Paroxysmal atrial fibrillation: Secondary | ICD-10-CM | POA: Diagnosis not present

## 2022-04-17 DIAGNOSIS — Z9889 Other specified postprocedural states: Secondary | ICD-10-CM | POA: Diagnosis not present

## 2022-04-18 ENCOUNTER — Other Ambulatory Visit: Payer: Self-pay | Admitting: Oncology

## 2022-04-18 ENCOUNTER — Inpatient Hospital Stay: Payer: Medicare HMO | Attending: Oncology | Admitting: Oncology

## 2022-04-18 ENCOUNTER — Encounter: Payer: Self-pay | Admitting: Oncology

## 2022-04-18 ENCOUNTER — Inpatient Hospital Stay: Payer: Medicare HMO

## 2022-04-18 VITALS — BP 160/98 | HR 61 | Temp 97.8°F | Resp 20 | Ht 64.0 in | Wt 134.2 lb

## 2022-04-18 DIAGNOSIS — C50412 Malignant neoplasm of upper-outer quadrant of left female breast: Secondary | ICD-10-CM | POA: Diagnosis not present

## 2022-04-18 DIAGNOSIS — I10 Essential (primary) hypertension: Secondary | ICD-10-CM | POA: Insufficient documentation

## 2022-04-18 DIAGNOSIS — Z170 Estrogen receptor positive status [ER+]: Secondary | ICD-10-CM

## 2022-04-18 DIAGNOSIS — Z9221 Personal history of antineoplastic chemotherapy: Secondary | ICD-10-CM | POA: Diagnosis not present

## 2022-04-18 DIAGNOSIS — M85859 Other specified disorders of bone density and structure, unspecified thigh: Secondary | ICD-10-CM | POA: Insufficient documentation

## 2022-04-18 DIAGNOSIS — Z803 Family history of malignant neoplasm of breast: Secondary | ICD-10-CM | POA: Insufficient documentation

## 2022-04-18 DIAGNOSIS — M8580 Other specified disorders of bone density and structure, unspecified site: Secondary | ICD-10-CM

## 2022-04-18 DIAGNOSIS — Z79811 Long term (current) use of aromatase inhibitors: Secondary | ICD-10-CM | POA: Insufficient documentation

## 2022-04-18 DIAGNOSIS — Z780 Asymptomatic menopausal state: Secondary | ICD-10-CM | POA: Diagnosis not present

## 2022-04-18 LAB — CBC WITH DIFFERENTIAL (CANCER CENTER ONLY)
Abs Immature Granulocytes: 0.06 10*3/uL (ref 0.00–0.07)
Basophils Absolute: 0.1 10*3/uL (ref 0.0–0.1)
Basophils Relative: 1 %
Eosinophils Absolute: 0.1 10*3/uL (ref 0.0–0.5)
Eosinophils Relative: 2 %
HCT: 42.7 % (ref 36.0–46.0)
Hemoglobin: 13.5 g/dL (ref 12.0–15.0)
Immature Granulocytes: 1 %
Lymphocytes Relative: 17 %
Lymphs Abs: 1 10*3/uL (ref 0.7–4.0)
MCH: 31.5 pg (ref 26.0–34.0)
MCHC: 31.6 g/dL (ref 30.0–36.0)
MCV: 99.5 fL (ref 80.0–100.0)
Monocytes Absolute: 0.8 10*3/uL (ref 0.1–1.0)
Monocytes Relative: 14 %
Neutro Abs: 3.9 10*3/uL (ref 1.7–7.7)
Neutrophils Relative %: 65 %
Platelet Count: 122 10*3/uL — ABNORMAL LOW (ref 150–400)
RBC: 4.29 MIL/uL (ref 3.87–5.11)
RDW: 14.6 % (ref 11.5–15.5)
WBC Count: 6 10*3/uL (ref 4.0–10.5)
nRBC: 0 % (ref 0.0–0.2)

## 2022-04-18 LAB — CMP (CANCER CENTER ONLY)
ALT: 42 U/L (ref 0–44)
AST: 44 U/L — ABNORMAL HIGH (ref 15–41)
Albumin: 4 g/dL (ref 3.5–5.0)
Alkaline Phosphatase: 65 U/L (ref 38–126)
Anion gap: 7 (ref 5–15)
BUN: 24 mg/dL — ABNORMAL HIGH (ref 8–23)
CO2: 29 mmol/L (ref 22–32)
Calcium: 9 mg/dL (ref 8.9–10.3)
Chloride: 106 mmol/L (ref 98–111)
Creatinine: 0.86 mg/dL (ref 0.44–1.00)
GFR, Estimated: >60 mL/min (ref >60)
Glucose, Bld: 100 mg/dL — ABNORMAL HIGH (ref 70–99)
Potassium: 3.6 mmol/L (ref 3.5–5.1)
Sodium: 142 mmol/L (ref 135–145)
Total Bilirubin: 1.3 mg/dL — ABNORMAL HIGH (ref 0.3–1.2)
Total Protein: 6.8 g/dL (ref 6.5–8.1)

## 2022-04-18 MED ORDER — ANASTROZOLE 1 MG PO TABS: 1.0000 mg | ORAL_TABLET | Freq: Every day | ORAL | 3 refills | Status: DC

## 2022-04-18 NOTE — Progress Notes (Signed)
Face to face visit with pt today in [place]. Pt completed her last chemo in 03/16/22 and is very pleased to have it behind her. Pt reports that her next mammo is coming up and she is really nervous about it. We discussed that this is a very normal response since the last time she got a mammogram, the news was life changing. Pt is having some heart issues and is seeing a cardiologist.

## 2022-04-21 ENCOUNTER — Telehealth: Payer: Self-pay

## 2022-04-21 NOTE — Telephone Encounter (Signed)
-----   Message from [name], MD sent at 04/20/2022  4:20 PM EST ----- Regarding: call Tell her labs look good except she is dry, needs to drink more fluids. One of the liver tests is slightly abn, it did this before, will just watch it

## 2022-04-21 NOTE — Telephone Encounter (Signed)
Patient notified of lab results

## 2022-04-24 DIAGNOSIS — I480 Paroxysmal atrial fibrillation: Secondary | ICD-10-CM | POA: Diagnosis not present

## 2022-04-24 DIAGNOSIS — I341 Nonrheumatic mitral (valve) prolapse: Secondary | ICD-10-CM | POA: Diagnosis not present

## 2022-04-24 DIAGNOSIS — I340 Nonrheumatic mitral (valve) insufficiency: Secondary | ICD-10-CM | POA: Diagnosis not present

## 2022-04-24 DIAGNOSIS — I10 Essential (primary) hypertension: Secondary | ICD-10-CM | POA: Diagnosis not present

## 2022-04-24 DIAGNOSIS — Z9889 Other specified postprocedural states: Secondary | ICD-10-CM | POA: Diagnosis not present

## 2022-04-24 DIAGNOSIS — I491 Atrial premature depolarization: Secondary | ICD-10-CM | POA: Diagnosis not present

## 2022-04-27 DIAGNOSIS — I059 Rheumatic mitral valve disease, unspecified: Secondary | ICD-10-CM | POA: Diagnosis not present

## 2022-04-27 DIAGNOSIS — J101 Influenza due to other identified influenza virus with other respiratory manifestations: Secondary | ICD-10-CM | POA: Diagnosis not present

## 2022-04-27 DIAGNOSIS — C50911 Malignant neoplasm of unspecified site of right female breast: Secondary | ICD-10-CM | POA: Diagnosis not present

## 2022-04-27 DIAGNOSIS — R509 Fever, unspecified: Secondary | ICD-10-CM | POA: Diagnosis not present

## 2022-04-27 DIAGNOSIS — I480 Paroxysmal atrial fibrillation: Secondary | ICD-10-CM | POA: Diagnosis not present

## 2022-04-27 DIAGNOSIS — Z6822 Body mass index (BMI) 22.0-22.9, adult: Secondary | ICD-10-CM | POA: Diagnosis not present

## 2022-05-01 ENCOUNTER — Telehealth: Payer: Self-pay

## 2022-05-01 NOTE — Telephone Encounter (Signed)
Attempted to contact patient. No answer. 

## 2022-05-01 NOTE — Telephone Encounter (Signed)
Patient called back. Message relayed to patient/

## 2022-05-01 NOTE — Telephone Encounter (Signed)
-----   Message from [name], MD sent at 05/01/2022 12:09 PM EST ----- Since she just finished chemo 1 month ago, it would be okay to wait until after those procedures done ----- Message ----- From: Belva Chimes, LPN Sent: [id number]  10:47 AM EST To: [name], MD  Hassan Rowan called to let us know that she has not started her hormone blocker yet. Due to have a heart cath and Transesophageal Echo on March 5. Her Mitral Valve is acting up. Patient wants to know if she needs to go ahead and start the hormone blocker or wait.

## 2022-05-09 DIAGNOSIS — I493 Ventricular premature depolarization: Secondary | ICD-10-CM | POA: Diagnosis not present

## 2022-05-09 DIAGNOSIS — I083 Combined rheumatic disorders of mitral, aortic and tricuspid valves: Secondary | ICD-10-CM | POA: Diagnosis not present

## 2022-05-09 DIAGNOSIS — I480 Paroxysmal atrial fibrillation: Secondary | ICD-10-CM | POA: Diagnosis not present

## 2022-05-09 DIAGNOSIS — I340 Nonrheumatic mitral (valve) insufficiency: Secondary | ICD-10-CM | POA: Diagnosis not present

## 2022-05-09 DIAGNOSIS — I2510 Atherosclerotic heart disease of native coronary artery without angina pectoris: Secondary | ICD-10-CM | POA: Diagnosis not present

## 2022-05-09 DIAGNOSIS — I10 Essential (primary) hypertension: Secondary | ICD-10-CM | POA: Diagnosis not present

## 2022-05-09 DIAGNOSIS — I272 Pulmonary hypertension, unspecified: Secondary | ICD-10-CM | POA: Diagnosis not present

## 2022-05-09 DIAGNOSIS — Z952 Presence of prosthetic heart valve: Secondary | ICD-10-CM | POA: Diagnosis not present

## 2022-05-09 DIAGNOSIS — I341 Nonrheumatic mitral (valve) prolapse: Secondary | ICD-10-CM | POA: Diagnosis not present

## 2022-05-09 DIAGNOSIS — Z8249 Family history of ischemic heart disease and other diseases of the circulatory system: Secondary | ICD-10-CM | POA: Diagnosis not present

## 2022-05-11 DIAGNOSIS — R928 Other abnormal and inconclusive findings on diagnostic imaging of breast: Secondary | ICD-10-CM | POA: Diagnosis not present

## 2022-05-11 DIAGNOSIS — C50412 Malignant neoplasm of upper-outer quadrant of left female breast: Secondary | ICD-10-CM | POA: Diagnosis not present

## 2022-05-18 DIAGNOSIS — I517 Cardiomegaly: Secondary | ICD-10-CM | POA: Diagnosis not present

## 2022-05-18 DIAGNOSIS — K449 Diaphragmatic hernia without obstruction or gangrene: Secondary | ICD-10-CM | POA: Diagnosis not present

## 2022-05-18 DIAGNOSIS — I340 Nonrheumatic mitral (valve) insufficiency: Secondary | ICD-10-CM | POA: Diagnosis not present

## 2022-05-18 DIAGNOSIS — I288 Other diseases of pulmonary vessels: Secondary | ICD-10-CM | POA: Diagnosis not present

## 2022-05-18 DIAGNOSIS — I281 Aneurysm of pulmonary artery: Secondary | ICD-10-CM | POA: Diagnosis not present

## 2022-05-18 DIAGNOSIS — Z95818 Presence of other cardiac implants and grafts: Secondary | ICD-10-CM | POA: Diagnosis not present

## 2022-05-22 ENCOUNTER — Telehealth: Payer: Self-pay

## 2022-05-22 DIAGNOSIS — C50412 Malignant neoplasm of upper-outer quadrant of left female breast: Secondary | ICD-10-CM | POA: Diagnosis not present

## 2022-05-22 DIAGNOSIS — Z170 Estrogen receptor positive status [ER+]: Secondary | ICD-10-CM | POA: Diagnosis not present

## 2022-05-22 NOTE — Telephone Encounter (Signed)
Pt called and wanted to know if she could go ahead and start the anastrozole? She states she has completed heart tests. She mentioned that Dr Noberto Retort told her she needed to go ahead and start, but she wanted to know from you.

## 2022-05-22 NOTE — Telephone Encounter (Signed)
Please place order for VAD flushes. I will send message to scheduling to call pt w/ appts. Thanks!

## 2022-06-09 DIAGNOSIS — E785 Hyperlipidemia, unspecified: Secondary | ICD-10-CM | POA: Diagnosis not present

## 2022-06-09 DIAGNOSIS — I341 Nonrheumatic mitral (valve) prolapse: Secondary | ICD-10-CM | POA: Diagnosis not present

## 2022-06-09 DIAGNOSIS — I480 Paroxysmal atrial fibrillation: Secondary | ICD-10-CM | POA: Diagnosis not present

## 2022-06-09 DIAGNOSIS — Z79899 Other long term (current) drug therapy: Secondary | ICD-10-CM | POA: Diagnosis not present

## 2022-06-09 DIAGNOSIS — I340 Nonrheumatic mitral (valve) insufficiency: Secondary | ICD-10-CM | POA: Diagnosis not present

## 2022-06-09 DIAGNOSIS — I10 Essential (primary) hypertension: Secondary | ICD-10-CM | POA: Diagnosis not present

## 2022-06-20 DIAGNOSIS — I4891 Unspecified atrial fibrillation: Secondary | ICD-10-CM | POA: Diagnosis not present

## 2022-06-20 DIAGNOSIS — M7989 Other specified soft tissue disorders: Secondary | ICD-10-CM | POA: Diagnosis not present

## 2022-06-20 DIAGNOSIS — R600 Localized edema: Secondary | ICD-10-CM | POA: Diagnosis not present

## 2022-06-20 DIAGNOSIS — N1831 Chronic kidney disease, stage 3a: Secondary | ICD-10-CM | POA: Diagnosis not present

## 2022-06-20 DIAGNOSIS — Z6822 Body mass index (BMI) 22.0-22.9, adult: Secondary | ICD-10-CM | POA: Diagnosis not present

## 2022-06-20 DIAGNOSIS — I340 Nonrheumatic mitral (valve) insufficiency: Secondary | ICD-10-CM | POA: Diagnosis not present

## 2022-06-20 DIAGNOSIS — I10 Essential (primary) hypertension: Secondary | ICD-10-CM | POA: Diagnosis not present

## 2022-06-20 DIAGNOSIS — D696 Thrombocytopenia, unspecified: Secondary | ICD-10-CM | POA: Diagnosis not present

## 2022-06-20 DIAGNOSIS — C50912 Malignant neoplasm of unspecified site of left female breast: Secondary | ICD-10-CM | POA: Diagnosis not present

## 2022-06-20 DIAGNOSIS — R2241 Localized swelling, mass and lump, right lower limb: Secondary | ICD-10-CM | POA: Diagnosis not present

## 2022-07-14 NOTE — Progress Notes (Signed)
[facility]  [address] [place],  [place]  [address] [phone]  Clinic Day: 07/19/2022  Referring physician: Buckner Malta, MD  ASSESSMENT & PLAN:  Stage IIB left breast cancer This is a grade 3 infiltrating ductal carcinoma with a T2 N0 M0 measuring 28 mm with 2 negative nodes.  She also has high-grade ductal carcinoma in situ.  Margins are clear.  Estrogen receptors positive at 30% and progesterone receptors are negative with HER2 negative and a Ki-67 of 60%.  I feel she is at higher risk for recurrence and discussed chemotherapy such as TC for 4 cycles.  However, we had to take into consideration her advanced age of 20 and whether she would tolerate treatment.  Her EndoPredict score came back at 5.2, correlating with a 48% risk of distant recurrence in the next 10 years. She would have 24% benefit with chemotherapy. She decided to proceed with CMF chemotherapy, and that was started 09/29/2021. She completed 6 months of treatment in mid January.   Osteopenia Bone density scan was done in September 2022 and reveals osteopenia of the hip with a T score of -2.0.  The spine readings were normal.  She was offered Fosamax but declined.  Large seroma/hematoma of the left breast This is improved.   Positive family history for breast cancer She has 2 maternal aunts and a maternal cousin as well as a first paternal cousin all with breast cancer, primarily in their 31's and 65's.  I discussed the option of genetic testing but she seems not inclined to pursue this since she has no children of her own and is 49 years old, so it would not likely change her management.   Plan:  She was treated with 6 months of CMF chemotherapy. She continues Anastrozole 1mg  without difficulty. She finished her chemotherapy January 11th but hasn't had her port flushed since. I will have her port flushed before she leaves today. I informed her that she can have it removed anytime  she wants by Dr. Georgiana Shore and she is still deciding. Dr. Georgiana Shore scheduled her mammogram and follow-up in March. Her PCP schedules her bone density scans. She will be due for her bone density scan in September of 2024. Her labs today are pending. I will see her back in 3 months with a port flush and labs depending on her current lab results and I will send a copy to Dr. York Grice. I discussed the assessment and treatment plan with the patient.  The patient was provided an opportunity to ask questions and all were answered.  The patient agreed with the plan and demonstrated an understanding of the instructions.  The patient was advised to call back if the symptoms worsen or if the condition fails to improve as anticipated.  I provided 15 minutes of face-to-face time during this this encounter and > 50% was spent counseling as documented under my assessment and plan.    Dellia Beckwith, MD [facility] AT [facility] [address] [place] [address] Dept: [phone] Dept Fax: [phone]   CHIEF COMPLAINT:  CC: Stage IIB hormone receptor positive breast cancer  Current Treatment: Hormonal therapy with anastrozole  HISTORY OF PRESENT ILLNESS:  Ashley Villa 82 y.o. female is here because of recent diagnosis of left breast carcinoma. The cancer was detected by screening mammogram on March 6 which showed a possible mass. The cancer was not palpable prior to diagnosis.  She was  sent for diagnostic mammogram on March 23 and this confirmed a persistent mass in the slightly upper left breast, middle depth.  An ultrasound confirmed a 1.6 cm irregular hypoechoic mass at 12:00, 2 cm from the nipple.  She then had a biopsy performed on March 29 which revealed invasive ductal carcinoma.  Estrogen receptors were positive at 30% with progesterone receptors negative and HER2 negative although it did show 1+ on immuno histochemistry.  Ki 67 with 60%.   She was referred to Dr. Terrilee Files who performed a lumpectomy on May 10.  The final pathology revealed a grade 3 invasive ductal carcinoma with high-grade ductal carcinoma in situ.  Margins were clear and 2 sentinel nodes were negative.  The final measurement was 28 mm for a T2 N0 M0.  She does have yearly mammograms.  She was treated with adjuvant chemotherapy of CMF for 6 months.  She was then placed on hormonal therapy with anastrozole 1 mg daily.   I reviewed her records extensively and collaborated the history with the patient. Oncology History  Breast cancer of upper-outer quadrant of left female breast (HCC)  06/01/2021 Initial Diagnosis   Breast cancer of upper-outer quadrant of left female breast (HCC)   07/13/2021 Cancer Staging   Staging form: Breast, AJCC 8th Edition - Clinical stage from 07/13/2021: Stage IIB (cT2, cN0(sn), cM0, G3, ER+, PR-, HER2-) - Signed by Dellia Beckwith, MD on 08/10/2021 Histopathologic type: Infiltrating duct carcinoma, NOS Stage prefix: Initial diagnosis Method of lymph node assessment: Sentinel lymph node biopsy Nuclear grade: G3 Histologic grading system: 3 grade system Laterality: Left Tumor size (mm): 28 Lymph-vascular invasion (LVI): LVI not present (absent)/not identified Diagnostic confirmation: Positive histology Specimen type: Excision Staged by: Managing physician Percentage of positive estrogen receptors (%): 30 Percentage of positive progesterone receptors (%): 0 HER2-IHC interpretation: Negative HER2-IHC value: Score 1+ Menopausal status: Postmenopausal Ki-67 (%): 60 Stage used in treatment planning: Yes National guidelines used in treatment planning: Yes Type of national guideline used in treatment planning: NCCN   09/29/2021 - 10/27/2021 Chemotherapy   Patient is on Treatment Plan : BREAST Adjuvant CMF IV q21d     09/29/2021 - 03/16/2022 Chemotherapy   Patient is on Treatment Plan : BREAST Adjuvant CMF IV q21d        INTERVAL HISTORY:  Ashley Villa is here today for a routine follow up for stage IIB hormone receptor positive breast cancer. She was treated with 6 months of CMF chemotherapy. Patient states that she feels well and complains of arthritis pain. She continues Anastrozole 1mg  without difficulty. She finished her chemotherapy January 11th but hasn't had her port flushed since. I will have her port flushed before she leaves today. I informed her that she can have it removed anytime she wishes by Dr. Georgiana Shore and she is still deciding. Dr. Georgiana Shore scheduled her mammogram and follow-up in March. Her PCP schedules her bone density scans. Her labs today are pending. I will see her back in 3 months with a port flush and labs depending on her current lab results and I will send a copy to Dr. York Grice. She denies signs of infection such as sore throat, sinus drainage, cough, or urinary symptoms.  She denies fevers or recurrent chills. She denies pain. She denies nausea, vomiting, chest pain, dyspnea or cough. Her appetite is good and her weight has decreased 6 pounds over last 3 months . This patient is accompanied in the office by her  husband .  REVIEW OF SYSTEMS:  Review of Systems  Constitutional: Negative.  Negative for appetite change, chills, diaphoresis, fatigue, fever and unexpected weight change.  HENT:  Negative.  Negative for hearing loss, lump/mass, mouth sores, nosebleeds, sore throat, tinnitus, trouble swallowing and voice change.   Eyes: Negative.  Negative for eye problems and icterus.  Respiratory: Negative.  Negative for chest tightness, cough, hemoptysis, shortness of breath and wheezing.   Cardiovascular: Negative.  Negative for chest pain, leg swelling and palpitations.  Gastrointestinal: Negative.  Negative for abdominal distention, abdominal pain, blood in stool, constipation, diarrhea, nausea, rectal pain and vomiting.  Endocrine: Negative.  Negative for hot flashes.  Genitourinary: Negative.   Negative for bladder incontinence, difficulty urinating, dyspareunia, dysuria, frequency, hematuria, menstrual problem, nocturia, pelvic pain, vaginal bleeding and vaginal discharge.   Musculoskeletal:  Positive for arthralgias. Negative for back pain, flank pain, gait problem, myalgias, neck pain and neck stiffness.       Arthritis pain  Skin: Negative.  Negative for itching, rash and wound.  Neurological:  Positive for light-headedness (right foot feels numb on the top). Negative for dizziness, extremity weakness, gait problem, headaches, numbness, seizures and speech difficulty.  Hematological: Negative.  Negative for adenopathy. Does not bruise/bleed easily.  Psychiatric/Behavioral: Negative.  Negative for confusion, decreased concentration, depression, sleep disturbance and suicidal ideas. The patient is not nervous/anxious.      VITALS:  Blood pressure 130/80, pulse (!) 58, temperature 97.8 F (36.6 C), temperature source Oral, resp. rate 18, height 5\' 4"  (1.626 m), weight 128 lb 6.4 oz (58.2 kg), SpO2 98 %.  Wt Readings from Last 3 Encounters:  07/19/22 128 lb 6.4 oz (58.2 kg)  04/18/22 134 lb 3.2 oz (60.9 kg)  03/16/22 132 lb (59.9 kg)    Body mass index is 22.04 kg/m.  Performance status (ECOG): 0 - Asymptomatic  PHYSICAL EXAM:  Physical Exam Vitals and nursing note reviewed. Exam conducted with a chaperone present.  Constitutional:      General: She is not in acute distress.    Appearance: Normal appearance. She is normal weight. She is not ill-appearing, toxic-appearing or diaphoretic.  HENT:     Head: Normocephalic and atraumatic.     Right Ear: Tympanic membrane, ear canal and external ear normal. There is no impacted cerumen.     Left Ear: Tympanic membrane, ear canal and external ear normal. There is no impacted cerumen.     Nose: Nose normal. No congestion or rhinorrhea.     Mouth/Throat:     Mouth: Mucous membranes are moist.     Pharynx: Oropharynx is clear. No  oropharyngeal exudate or posterior oropharyngeal erythema.  Eyes:     General: No scleral icterus.       Right eye: No discharge.        Left eye: No discharge.     Extraocular Movements: Extraocular movements intact.     Conjunctiva/sclera: Conjunctivae normal.     Pupils: Pupils are equal, round, and reactive to light.  Neck:     Vascular: No carotid bruit.  Cardiovascular:     Rate and Rhythm: Regular rhythm. Bradycardia present.     Pulses: Normal pulses.     Heart sounds: Normal heart sounds. No murmur heard.    No friction rub. No gallop.  Pulmonary:     Effort: Pulmonary effort is normal. No respiratory distress.     Breath sounds: Normal breath sounds. No stridor. No wheezing, rhonchi or rales.  Chest:     Chest wall: No tenderness.  Comments: Port in right upper chest wall is normal. Old well healed scar in the upper outer quadrant of the right breast which is along the outer areolar complex. Scar in the left breast in the upper inner quadrant healing well but firm, feels like scar tissue.  Resolving seroma in the central left breast.   Abdominal:     General: Bowel sounds are normal. There is no distension.     Palpations: Abdomen is soft. There is no hepatomegaly, splenomegaly or mass.     Tenderness: There is no abdominal tenderness. There is no right CVA tenderness, left CVA tenderness, guarding or rebound.     Hernia: No hernia is present.  Musculoskeletal:        General: No swelling, tenderness, deformity or signs of injury. Normal range of motion.     Cervical back: Normal range of motion and neck supple. No rigidity or tenderness.     Right lower leg: No edema.     Left lower leg: No edema.  Lymphadenopathy:     Cervical: No cervical adenopathy.     Right cervical: No superficial, deep or posterior cervical adenopathy.    Left cervical: No superficial, deep or posterior cervical adenopathy.     Upper Body:     Right upper body: No supraclavicular, axillary  or pectoral adenopathy.     Left upper body: No supraclavicular, axillary or pectoral adenopathy.     Lower Body: No right inguinal adenopathy. No left inguinal adenopathy.  Skin:    General: Skin is warm and dry.     Coloration: Skin is not jaundiced or pale.     Findings: No bruising, erythema, lesion or rash.  Neurological:     General: No focal deficit present.     Mental Status: She is alert and oriented to person, place, and time. Mental status is at baseline.     Cranial Nerves: No cranial nerve deficit.     Sensory: No sensory deficit.     Motor: No weakness.     Coordination: Coordination normal.     Gait: Gait normal.     Deep Tendon Reflexes: Reflexes normal.  Psychiatric:        Mood and Affect: Mood normal.        Behavior: Behavior normal.        Thought Content: Thought content normal.        Judgment: Judgment normal.    LABS:   Component Ref Range & Units 05/09/2022  WBC 4.40 - 11.00 10*3/uL 6.62  RBC 4.10 - 5.10 10*6/uL 4.16  Hemoglobin 12.3 - 15.3 g/dL 40.9  Hematocrit 81.1 - 44.6 % 39.4  Mean Corpuscular Volume (MCV) 80.0 - 96.0 fL 94.7  Mean Corpuscular Hemoglobin (MCH) 27.5 - 33.2 pg 31.1  Mean Corpuscular Hemoglobin Conc (MCHC) 33.0 - 37.0 g/dL 91.4 Low   Red Cell Distribution Width (RDW) 12.3 - 17.0 % 14.2  Platelet Count (PLT) 150 - 450 10*3/uL 121 Low   Mean Platelet Volume (MPV) 6.8 - 10.2 fL 8.9   Component Ref Range & Units 2 mo ago  Sodium 136 - 145 mmol/L 141  Potassium 3.4 - 4.5 mmol/L 3.9  Chloride 98 - 107 mmol/L 105  CO2 21 - 31 mmol/L 31  Anion Gap 6 - 14 mmol/L 5 Low   Glucose, Random 70 - 99 mg/dL 98  Blood Urea Nitrogen (BUN) 7 - 25 mg/dL 20  Creatinine 7.82 - 9.56 mg/dL 2.13  eGFR >08 MV/HQI/6.96E9 65  Calcium  8.6 - 10.3 mg/dL 9.1      Latest Ref Rng & Units 07/19/2022   10:15 AM 04/18/2022    9:25 AM 03/14/2022    9:20 AM  CBC  WBC 4.0 - 10.5 K/uL 6.2  6.0  3.5   Hemoglobin 12.0 - 15.0 g/dL 16.1  09.6   04.5   Hematocrit 36.0 - 46.0 % 41.3  42.7  40.8   Platelets 150 - 400 K/uL 128  122  125       Latest Ref Rng & Units 07/19/2022   10:15 AM 04/18/2022    9:25 AM 03/14/2022    9:20 AM  CMP  Glucose 70 - 99 mg/dL 409  811  914   BUN 8 - 23 mg/dL 19  24  20    Creatinine 0.44 - 1.00 mg/dL 7.82  9.56  2.13   Sodium 135 - 145 mmol/L 143  142  140   Potassium 3.5 - 5.1 mmol/L 4.0  3.6  3.4   Chloride 98 - 111 mmol/L 104  106  104   CO2 22 - 32 mmol/L 30  29  28    Calcium 8.9 - 10.3 mg/dL 9.4  9.0  9.2   Total Protein 6.5 - 8.1 g/dL 7.0  6.8  6.6   Total Bilirubin 0.3 - 1.2 mg/dL 1.1  1.3  1.1   Alkaline Phos 38 - 126 U/L 69  65  75   AST 15 - 41 U/L 24  44  29   ALT 0 - 44 U/L 17  42  21      No results found for: "CEA1", "CEA" / No results found for: "CEA1", "CEA" No results found for: "PSA1" No results found for: "YQM578" No results found for: "CAN125"  No results found for: "TOTALPROTELP", "ALBUMINELP", "A1GS", "A2GS", "BETS", "BETA2SER", "GAMS", "MSPIKE", "SPEI" No results found for: "TIBC", "FERRITIN", "IRONPCTSAT" No results found for: "LDH"  STUDIES:  No results found.    HISTORY:   Past Medical History:  Diagnosis Date   Family history of breast cancer 02/17/2022   H/O mitral valve repair    HTN (hypertension)    Status post left breast lumpectomy     Past Surgical History:  Procedure Laterality Date   TONSILLECTOMY      Family History  Problem Relation Age of Onset   Cancer Mother    Cancer Maternal Aunt     Social History:  reports that she has never smoked. She has never used smokeless tobacco. She reports that she does not drink alcohol and does not use drugs.The patient is accompanied by her husband today.  Allergies: No Known Allergies  Current Medications: Current Outpatient Medications  Medication Sig Dispense Refill   amLODipine (NORVASC) 2.5 MG tablet Take 2.5 mg by mouth daily.     amoxicillin (AMOXIL) 500 MG capsule Prn prior to dentist  visit with hx of mitral valve prolapse (Patient not taking: Reported on 11/29/2021)     anastrozole (ARIMIDEX) 1 MG tablet Take 1 tablet (1 mg total) by mouth daily. 90 tablet 3   Calcium Citrate-Vitamin D 315-5 MG-MCG TABS Take 1 tablet by mouth daily.     Cholecalciferol 25 MCG (1000 UT) capsule Take by mouth.     Coenzyme Q10 50 MG CAPS Take by mouth.     docusate sodium (COLACE) 100 MG capsule Take 100 mg by mouth 2 (two) times daily.     FLUAD QUADRIVALENT 0.5 ML injection      glucosamine-chondroitin  500-400 MG tablet Take 1 tablet by mouth daily.     levocetirizine (XYZAL) 5 MG tablet Take 5 mg by mouth daily.     losartan (COZAAR) 100 MG tablet Take 100 mg by mouth daily.     metoprolol tartrate (LOPRESSOR) 25 MG tablet Take by mouth.     multivitamin-lutein (OCUVITE-LUTEIN) CAPS capsule Take 1 capsule by mouth daily.     ondansetron (ZOFRAN) 8 MG tablet Take 1 tablet (8 mg total) by mouth 2 (two) times daily as needed for refractory nausea / vomiting (Start on day 3 after chemotherapy). 30 tablet 1   prochlorperazine (COMPAZINE) 10 MG tablet Take 1 tablet (10 mg total) by mouth every 6 (six) hours as needed for nausea or vomiting. 30 tablet 1   simvastatin (ZOCOR) 20 MG tablet Take 20 mg by mouth at bedtime.     Current Facility-Administered Medications  Medication Dose Route Frequency Provider Last Rate Last Admin   sodium chloride flush (NS) 0.9 % injection 10 mL  10 mL Intracatheter PRN Dellia Beckwith, MD   10 mL at 07/19/22 1155      I,Jasmine M Lassiter,acting as a scribe for Dellia Beckwith, MD.,have documented all relevant documentation on the behalf of Dellia Beckwith, MD,as directed by  Dellia Beckwith, MD while in the presence of Dellia Beckwith, MD.

## 2022-07-19 ENCOUNTER — Inpatient Hospital Stay: Payer: Medicare HMO

## 2022-07-19 ENCOUNTER — Encounter: Payer: Self-pay | Admitting: Oncology

## 2022-07-19 ENCOUNTER — Inpatient Hospital Stay: Payer: Medicare HMO | Attending: Oncology | Admitting: Oncology

## 2022-07-19 VITALS — BP 130/80 | HR 58 | Temp 97.8°F | Resp 18 | Ht 64.0 in | Wt 128.4 lb

## 2022-07-19 DIAGNOSIS — Z170 Estrogen receptor positive status [ER+]: Secondary | ICD-10-CM

## 2022-07-19 DIAGNOSIS — Z780 Asymptomatic menopausal state: Secondary | ICD-10-CM

## 2022-07-19 DIAGNOSIS — C50412 Malignant neoplasm of upper-outer quadrant of left female breast: Secondary | ICD-10-CM | POA: Diagnosis not present

## 2022-07-19 DIAGNOSIS — M85859 Other specified disorders of bone density and structure, unspecified thigh: Secondary | ICD-10-CM | POA: Insufficient documentation

## 2022-07-19 DIAGNOSIS — Z79899 Other long term (current) drug therapy: Secondary | ICD-10-CM | POA: Diagnosis not present

## 2022-07-19 DIAGNOSIS — M8580 Other specified disorders of bone density and structure, unspecified site: Secondary | ICD-10-CM

## 2022-07-19 DIAGNOSIS — Z79811 Long term (current) use of aromatase inhibitors: Secondary | ICD-10-CM | POA: Diagnosis not present

## 2022-07-19 LAB — CMP (CANCER CENTER ONLY)
ALT: 17 U/L (ref 0–44)
AST: 24 U/L (ref 15–41)
Albumin: 3.7 g/dL (ref 3.5–5.0)
Alkaline Phosphatase: 69 U/L (ref 38–126)
Anion gap: 9 (ref 5–15)
BUN: 19 mg/dL (ref 8–23)
CO2: 30 mmol/L (ref 22–32)
Calcium: 9.4 mg/dL (ref 8.9–10.3)
Chloride: 104 mmol/L (ref 98–111)
Creatinine: 0.91 mg/dL (ref 0.44–1.00)
GFR, Estimated: >60 mL/min (ref >60)
Glucose, Bld: 112 mg/dL — ABNORMAL HIGH (ref 70–99)
Potassium: 4 mmol/L (ref 3.5–5.1)
Sodium: 143 mmol/L (ref 135–145)
Total Bilirubin: 1.1 mg/dL (ref 0.3–1.2)
Total Protein: 7 g/dL (ref 6.5–8.1)

## 2022-07-19 LAB — CBC WITH DIFFERENTIAL (CANCER CENTER ONLY)
Abs Immature Granulocytes: 0.01 10*3/uL (ref 0.00–0.07)
Basophils Absolute: 0 10*3/uL (ref 0.0–0.1)
Basophils Relative: 1 %
Eosinophils Absolute: 0.1 10*3/uL (ref 0.0–0.5)
Eosinophils Relative: 2 %
HCT: 41.3 % (ref 36.0–46.0)
Hemoglobin: 13.3 g/dL (ref 12.0–15.0)
Immature Granulocytes: 0 %
Lymphocytes Relative: 17 %
Lymphs Abs: 1 10*3/uL (ref 0.7–4.0)
MCH: 30.7 pg (ref 26.0–34.0)
MCHC: 32.2 g/dL (ref 30.0–36.0)
MCV: 95.4 fL (ref 80.0–100.0)
Monocytes Absolute: 0.6 10*3/uL (ref 0.1–1.0)
Monocytes Relative: 9 %
Neutro Abs: 4.4 10*3/uL (ref 1.7–7.7)
Neutrophils Relative %: 71 %
Platelet Count: 128 10*3/uL — ABNORMAL LOW (ref 150–400)
RBC: 4.33 MIL/uL (ref 3.87–5.11)
RDW: 13.2 % (ref 11.5–15.5)
WBC Count: 6.2 10*3/uL (ref 4.0–10.5)
nRBC: 0 % (ref 0.0–0.2)

## 2022-07-19 MED ORDER — SODIUM CHLORIDE 0.9% FLUSH
10.0000 mL | INTRAVENOUS | Status: DC | PRN
  Administered 2022-07-19: 10 mL

## 2022-07-19 MED ORDER — HEPARIN SOD (PORK) LOCK FLUSH 100 UNIT/ML IV SOLN
500.0000 [IU] | Freq: Once | INTRAVENOUS | Status: AC | PRN
  Administered 2022-07-19: 500 [IU]

## 2022-08-02 ENCOUNTER — Encounter: Payer: Self-pay | Admitting: Oncology

## 2022-08-29 DIAGNOSIS — Z9889 Other specified postprocedural states: Secondary | ICD-10-CM | POA: Diagnosis not present

## 2022-08-29 DIAGNOSIS — R001 Bradycardia, unspecified: Secondary | ICD-10-CM | POA: Diagnosis not present

## 2022-08-29 DIAGNOSIS — I480 Paroxysmal atrial fibrillation: Secondary | ICD-10-CM | POA: Diagnosis not present

## 2022-08-29 DIAGNOSIS — I341 Nonrheumatic mitral (valve) prolapse: Secondary | ICD-10-CM | POA: Diagnosis not present

## 2022-08-29 DIAGNOSIS — R002 Palpitations: Secondary | ICD-10-CM | POA: Diagnosis not present

## 2022-08-29 DIAGNOSIS — I491 Atrial premature depolarization: Secondary | ICD-10-CM | POA: Diagnosis not present

## 2022-08-29 DIAGNOSIS — I10 Essential (primary) hypertension: Secondary | ICD-10-CM | POA: Diagnosis not present

## 2022-08-29 DIAGNOSIS — I340 Nonrheumatic mitral (valve) insufficiency: Secondary | ICD-10-CM | POA: Diagnosis not present

## 2022-08-29 DIAGNOSIS — I493 Ventricular premature depolarization: Secondary | ICD-10-CM | POA: Diagnosis not present

## 2022-09-04 DIAGNOSIS — Z452 Encounter for adjustment and management of vascular access device: Secondary | ICD-10-CM | POA: Diagnosis not present

## 2022-10-04 DIAGNOSIS — Z9221 Personal history of antineoplastic chemotherapy: Secondary | ICD-10-CM | POA: Diagnosis not present

## 2022-10-04 DIAGNOSIS — I10 Essential (primary) hypertension: Secondary | ICD-10-CM | POA: Diagnosis not present

## 2022-10-04 DIAGNOSIS — Z170 Estrogen receptor positive status [ER+]: Secondary | ICD-10-CM | POA: Diagnosis not present

## 2022-10-04 DIAGNOSIS — C50911 Malignant neoplasm of unspecified site of right female breast: Secondary | ICD-10-CM | POA: Diagnosis not present

## 2022-10-04 DIAGNOSIS — Z79811 Long term (current) use of aromatase inhibitors: Secondary | ICD-10-CM | POA: Diagnosis not present

## 2022-10-04 DIAGNOSIS — Z452 Encounter for adjustment and management of vascular access device: Secondary | ICD-10-CM | POA: Diagnosis not present

## 2022-10-04 DIAGNOSIS — Z853 Personal history of malignant neoplasm of breast: Secondary | ICD-10-CM | POA: Diagnosis not present

## 2022-10-04 DIAGNOSIS — Z954 Presence of other heart-valve replacement: Secondary | ICD-10-CM | POA: Diagnosis not present

## 2022-10-10 DIAGNOSIS — Z09 Encounter for follow-up examination after completed treatment for conditions other than malignant neoplasm: Secondary | ICD-10-CM | POA: Diagnosis not present

## 2022-10-10 DIAGNOSIS — Z452 Encounter for adjustment and management of vascular access device: Secondary | ICD-10-CM | POA: Diagnosis not present

## 2022-10-19 ENCOUNTER — Inpatient Hospital Stay: Payer: Medicare HMO

## 2022-10-19 ENCOUNTER — Inpatient Hospital Stay: Payer: Medicare HMO | Attending: Oncology | Admitting: Oncology

## 2022-10-19 ENCOUNTER — Other Ambulatory Visit: Payer: Self-pay | Admitting: Oncology

## 2022-10-19 ENCOUNTER — Encounter: Payer: Self-pay | Admitting: Oncology

## 2022-10-19 VITALS — BP 155/81 | HR 58 | Temp 97.8°F | Resp 18 | Ht 64.0 in | Wt 130.3 lb

## 2022-10-19 DIAGNOSIS — Z9221 Personal history of antineoplastic chemotherapy: Secondary | ICD-10-CM | POA: Insufficient documentation

## 2022-10-19 DIAGNOSIS — M8580 Other specified disorders of bone density and structure, unspecified site: Secondary | ICD-10-CM | POA: Diagnosis not present

## 2022-10-19 DIAGNOSIS — N6489 Other specified disorders of breast: Secondary | ICD-10-CM | POA: Diagnosis not present

## 2022-10-19 DIAGNOSIS — Z79811 Long term (current) use of aromatase inhibitors: Secondary | ICD-10-CM | POA: Insufficient documentation

## 2022-10-19 DIAGNOSIS — Z803 Family history of malignant neoplasm of breast: Secondary | ICD-10-CM | POA: Insufficient documentation

## 2022-10-19 DIAGNOSIS — Z780 Asymptomatic menopausal state: Secondary | ICD-10-CM | POA: Diagnosis not present

## 2022-10-19 DIAGNOSIS — Z170 Estrogen receptor positive status [ER+]: Secondary | ICD-10-CM

## 2022-10-19 DIAGNOSIS — C50412 Malignant neoplasm of upper-outer quadrant of left female breast: Secondary | ICD-10-CM | POA: Insufficient documentation

## 2022-10-19 DIAGNOSIS — D696 Thrombocytopenia, unspecified: Secondary | ICD-10-CM | POA: Diagnosis not present

## 2022-10-19 DIAGNOSIS — M85859 Other specified disorders of bone density and structure, unspecified thigh: Secondary | ICD-10-CM | POA: Diagnosis not present

## 2022-10-19 LAB — CBC WITH DIFFERENTIAL (CANCER CENTER ONLY)
Abs Immature Granulocytes: 0.04 10*3/uL (ref 0.00–0.07)
Basophils Absolute: 0 10*3/uL (ref 0.0–0.1)
Basophils Relative: 1 %
Eosinophils Absolute: 0.1 10*3/uL (ref 0.0–0.5)
Eosinophils Relative: 3 %
HCT: 41.7 % (ref 36.0–46.0)
Hemoglobin: 13.3 g/dL (ref 12.0–15.0)
Immature Granulocytes: 1 %
Lymphocytes Relative: 24 %
Lymphs Abs: 1 10*3/uL (ref 0.7–4.0)
MCH: 30.9 pg (ref 26.0–34.0)
MCHC: 31.9 g/dL (ref 30.0–36.0)
MCV: 97 fL (ref 80.0–100.0)
Monocytes Absolute: 0.6 10*3/uL (ref 0.1–1.0)
Monocytes Relative: 14 %
Neutro Abs: 2.2 10*3/uL (ref 1.7–7.7)
Neutrophils Relative %: 57 %
Platelet Count: 124 10*3/uL — ABNORMAL LOW (ref 150–400)
RBC: 4.3 MIL/uL (ref 3.87–5.11)
RDW: 13.6 % (ref 11.5–15.5)
WBC Count: 4 10*3/uL (ref 4.0–10.5)
nRBC: 0 % (ref 0.0–0.2)

## 2022-10-19 LAB — CMP (CANCER CENTER ONLY)
ALT: 22 U/L (ref 0–44)
AST: 33 U/L (ref 15–41)
Albumin: 3.8 g/dL (ref 3.5–5.0)
Alkaline Phosphatase: 71 U/L (ref 38–126)
Anion gap: 11 (ref 5–15)
BUN: 23 mg/dL (ref 8–23)
CO2: 28 mmol/L (ref 22–32)
Calcium: 9.7 mg/dL (ref 8.9–10.3)
Chloride: 102 mmol/L (ref 98–111)
Creatinine: 0.9 mg/dL (ref 0.44–1.00)
GFR, Estimated: >60 mL/min (ref >60)
Glucose, Bld: 97 mg/dL (ref 70–99)
Potassium: 3.8 mmol/L (ref 3.5–5.1)
Sodium: 141 mmol/L (ref 135–145)
Total Bilirubin: 1.1 mg/dL (ref 0.3–1.2)
Total Protein: 6.9 g/dL (ref 6.5–8.1)

## 2022-10-19 NOTE — Progress Notes (Signed)
[facility]  [address] [name],  [place]  [address] [phone]  Clinic Day: 10/19/2022  Referring physician: Buckner Malta, MD  ASSESSMENT & PLAN:  Stage IIB left breast cancer This is a grade 3 infiltrating ductal carcinoma with a T2 N0 M0 measuring 28 mm with 2 negative nodes.  She also has high-grade ductal carcinoma in situ.  Margins are clear.  Estrogen receptors positive at 30% and progesterone receptors are negative with HER2 negative and a Ki-67 of 60%.  I feel she is at higher risk for recurrence and discussed chemotherapy such as TC for 4 cycles.  However, we had to take into consideration her advanced age of 38 and whether she would tolerate treatment.  Her EndoPredict score came back at 5.2, correlating with a 48% risk of distant recurrence in the next 10 years. She would have 24% benefit with chemotherapy. She decided to proceed with CMF chemotherapy, and that was started 09/29/2021. She completed 6 months of treatment in mid January, 2024.   Osteopenia Bone density scan was done in September 2022 and reveals osteopenia of the hip with a T score of -2.0.  The spine readings were normal.  She was offered Fosamax but declined. She will be due for her next DEXA in October, 2024 at [facility].   Large seroma/hematoma of the left breast This is improved.   Positive family history for breast cancer She has 2 maternal aunts and a maternal cousin as well as a first paternal cousin all with breast cancer, primarily in their 21's and 75's.  I discussed the option of genetic testing but she seems not inclined to pursue this since she has no children of her own and is 81 years old, so it would not likely change her management.  Thrombocytopenia She had a normal platelet count back in 2020 but it was low during her CMF chemotherapy, running 90,000-125,000. Even off treatment, she continues to have a mild thrombocytopenia with an average  platelet count of 120,000 I explained this to her and answered her questions.   Plan:  She continues Anastrozole 1mg  without difficulty. She informed me that she had labs done before and her platelet level was found to be low, it was suggested that she have a follow-up for further evaluation. Her platelet count has stayed consistent within 122,000-125,000 for the past year so I am not concerned as we will continue to monitor it closely. I think it is likely residual effects from her chemotherapy last year, but she reminds me that she also had a low platelet count at the time of her heart surgery, prior to that. Her labs today are pending.  Her last bone density scan was in November 30, 2020 and she will be due for a repeat in October, 2024. She had her annual mammogram done in March, 2024. I will see her back in 3 months with CBC and CMP.  I discussed the assessment and treatment plan with the patient and her husband.  They were provided an opportunity to ask questions and all were answered. They agreed with the plan and demonstrated an understanding of the instructions. The patient was advised to call back if the symptoms worsen or if the condition fails to improve as anticipated.   I provided 27 minutes of face-to-face time during this this encounter and > 50% was spent counseling as documented under my assessment and plan.    Dellia Beckwith, MD [facility]  [facility] AT [facility] [address] [place] [address] Dept: [phone] Dept Fax: [phone]   CHIEF COMPLAINT:  CC: Stage IIB hormone receptor positive breast cancer  Current Treatment: Hormonal therapy with anastrozole  HISTORY OF PRESENT ILLNESS:  Ashley Villa 82 y.o. female is here because of recent diagnosis of left breast carcinoma. The cancer was detected by screening mammogram on March 6 which showed a possible mass. The cancer was not palpable prior to diagnosis.  She  was sent for diagnostic mammogram on March 23 and this confirmed a persistent mass in the slightly upper left breast, middle depth.  An ultrasound confirmed a 1.6 cm irregular hypoechoic mass at 12:00, 2 cm from the nipple.  She then had a biopsy performed on March 29 which revealed invasive ductal carcinoma.  Estrogen receptors were positive at 30% with progesterone receptors negative and HER2 negative although it did show 1+ on immuno histochemistry.  Ki 67 with 60%.  She was referred to Dr. Terrilee Files who performed a lumpectomy on May 10.  The final pathology revealed a grade 3 invasive ductal carcinoma with high-grade ductal carcinoma in situ.  Margins were clear and 2 sentinel nodes were negative.  The final measurement was 28 mm for a T2 N0 M0.  She does have yearly mammograms.  She was treated with adjuvant chemotherapy of CMF for 6 months.  She was then placed on hormonal therapy with anastrozole 1 mg daily.   I reviewed her records extensively and collaborated the history with the patient. Oncology History  Breast cancer of upper-outer quadrant of left female breast (HCC)  06/01/2021 Initial Diagnosis   Breast cancer of upper-outer quadrant of left female breast (HCC)   07/13/2021 Cancer Staging   Staging form: Breast, AJCC 8th Edition - Clinical stage from 07/13/2021: Stage IIB (cT2, cN0(sn), cM0, G3, ER+, PR-, HER2-) - Signed by Dellia Beckwith, MD on 08/10/2021 Histopathologic type: Infiltrating duct carcinoma, NOS Stage prefix: Initial diagnosis Method of lymph node assessment: Sentinel lymph node biopsy Nuclear grade: G3 Histologic grading system: 3 grade system Laterality: Left Tumor size (mm): 28 Lymph-vascular invasion (LVI): LVI not present (absent)/not identified Diagnostic confirmation: Positive histology Specimen type: Excision Staged by: Managing physician Percentage of positive estrogen receptors (%): 30 Percentage of positive progesterone receptors (%):  0 HER2-IHC interpretation: Negative HER2-IHC value: Score 1+ Menopausal status: Postmenopausal Ki-67 (%): 60 Stage used in treatment planning: Yes National guidelines used in treatment planning: Yes Type of national guideline used in treatment planning: NCCN   09/29/2021 - 10/27/2021 Chemotherapy   Patient is on Treatment Plan : BREAST Adjuvant CMF IV q21d     09/29/2021 - 03/16/2022 Chemotherapy   Patient is on Treatment Plan : BREAST Adjuvant CMF IV q21d       INTERVAL HISTORY:  Ashley Villa is here today for a routine follow up for stage IIB hormone receptor positive breast cancer. She was treated with 6 months of CMF chemotherapy and finished her chemotherapy January 11th, 2024. She continues Anastrozole 1 mg without difficulty. Patient states that she feels well but complains of hot flashes and burning in both feet but occasional swelling in the right foot only. She had a ultrasound doppler of the leg done in April, 2024 that was negative. I believe her right foot symptoms could be due degenerative disease of the spine. She has only changed her BP medication. She informed me that she had labs done before and her platelet level was found to  be low, it was suggested that she have a follow-up for further evaluation. Her platelet count has stayed consistent within 122,000-125,000 for the past year so I am not concerned as we will continue to monitor it closely. I think it is likely residual effects from her chemotherapy last year, but she reminds me that she also had a low platelet count at the time of her heart surgery, prior to that. Her labs today are pending.  Her last bone density scan was in November 30, 2020 and she will be due for a repeat in October, 2024. She had her annual mammogram done in March, 2024. I will see her back in 3 months with CBC and CMP. She denies signs of infection such as sore throat, sinus drainage, cough, or urinary symptoms.  She denies fevers or recurrent chills. She denies  pain. She denies nausea, vomiting, chest pain, dyspnea or cough. Her appetite is good and her weight has increased 2 pounds over last 3 months . This patient is accompanied in the office by her  husband .  REVIEW OF SYSTEMS:  Review of Systems  Constitutional: Negative.  Negative for appetite change, chills, diaphoresis, fatigue, fever and unexpected weight change.  HENT:  Negative.  Negative for hearing loss, lump/mass, mouth sores, nosebleeds, sore throat, tinnitus, trouble swallowing and voice change.   Eyes: Negative.  Negative for eye problems and icterus.  Respiratory: Negative.  Negative for chest tightness, cough, hemoptysis, shortness of breath and wheezing.   Cardiovascular: Negative.  Negative for chest pain, leg swelling and palpitations.  Gastrointestinal:  Positive for diarrhea. Negative for abdominal distention, abdominal pain, blood in stool, constipation, nausea, rectal pain and vomiting.  Endocrine: Positive for hot flashes (in her feet).  Genitourinary: Negative.  Negative for bladder incontinence, difficulty urinating, dyspareunia, dysuria, frequency, hematuria, menstrual problem, nocturia, pelvic pain, vaginal bleeding and vaginal discharge.   Musculoskeletal:  Positive for arthralgias. Negative for back pain, flank pain, gait problem, myalgias, neck pain and neck stiffness.       Arthritis pain  Skin: Negative.  Negative for itching, rash and wound.  Neurological:  Positive for numbness (right foot feels numb on the top). Negative for dizziness, extremity weakness, gait problem, headaches, light-headedness, seizures and speech difficulty.  Hematological: Negative.  Negative for adenopathy. Does not bruise/bleed easily.  Psychiatric/Behavioral: Negative.  Negative for confusion, decreased concentration, depression, sleep disturbance and suicidal ideas. The patient is not nervous/anxious.      VITALS:  Blood pressure (!) 155/81, pulse (!) 58, temperature 97.8 F (36.6 C),  temperature source Oral, resp. rate 18, height 5\' 4"  (1.626 m), weight 130 lb 4.8 oz (59.1 kg), SpO2 100%.  Wt Readings from Last 3 Encounters:  10/19/22 130 lb 4.8 oz (59.1 kg)  07/19/22 128 lb 6.4 oz (58.2 kg)  04/18/22 134 lb 3.2 oz (60.9 kg)    Body mass index is 22.37 kg/m.  Performance status (ECOG): 0 - Asymptomatic  PHYSICAL EXAM:  Physical Exam Vitals and nursing note reviewed. Exam conducted with a chaperone present.  Constitutional:      General: She is not in acute distress.    Appearance: Normal appearance. She is normal weight. She is not ill-appearing, toxic-appearing or diaphoretic.  HENT:     Head: Normocephalic and atraumatic.     Right Ear: Tympanic membrane, ear canal and external ear normal. There is no impacted cerumen.     Left Ear: Tympanic membrane, ear canal and external ear normal. There is no impacted cerumen.  Nose: Nose normal. No congestion or rhinorrhea.     Mouth/Throat:     Mouth: Mucous membranes are moist.     Pharynx: Oropharynx is clear. No oropharyngeal exudate or posterior oropharyngeal erythema.  Eyes:     General: No scleral icterus.       Right eye: No discharge.        Left eye: No discharge.     Extraocular Movements: Extraocular movements intact.     Conjunctiva/sclera: Conjunctivae normal.     Pupils: Pupils are equal, round, and reactive to light.  Neck:     Vascular: No carotid bruit.  Cardiovascular:     Rate and Rhythm: Regular rhythm. Bradycardia present.     Pulses: Normal pulses.     Heart sounds: Normal heart sounds. No murmur heard.    No friction rub. No gallop.  Pulmonary:     Effort: Pulmonary effort is normal. No respiratory distress.     Breath sounds: Normal breath sounds. No stridor. No wheezing, rhonchi or rales.  Chest:     Chest wall: No tenderness.     Comments: Scar in her right upper chest where her port was removed. Extensive bruising in her right breast.  Well healed scar in the upper outer  quadrant of the right breast adject to the adjacent to the areolar complex. Well healed scar in the upper inner quadrant of the left breast which is firm and benign, which is also adjacent to the areolar complex Residual from her seroma in the lower left breast.  No masses in either breasts Abdominal:     General: Bowel sounds are normal. There is no distension.     Palpations: Abdomen is soft. There is no hepatomegaly, splenomegaly or mass.     Tenderness: There is no abdominal tenderness. There is no right CVA tenderness, left CVA tenderness, guarding or rebound.     Hernia: No hernia is present.  Musculoskeletal:        General: No swelling, tenderness, deformity or signs of injury. Normal range of motion.     Cervical back: Normal range of motion and neck supple. No rigidity or tenderness.     Right lower leg: No edema.     Left lower leg: No edema.  Lymphadenopathy:     Cervical: No cervical adenopathy.     Right cervical: No superficial, deep or posterior cervical adenopathy.    Left cervical: No superficial, deep or posterior cervical adenopathy.     Upper Body:     Right upper body: No supraclavicular, axillary or pectoral adenopathy.     Left upper body: No supraclavicular, axillary or pectoral adenopathy.     Lower Body: No right inguinal adenopathy. No left inguinal adenopathy.  Skin:    General: Skin is warm and dry.     Coloration: Skin is not jaundiced or pale.     Findings: No bruising, erythema, lesion or rash.  Neurological:     General: No focal deficit present.     Mental Status: She is alert and oriented to person, place, and time. Mental status is at baseline.     Cranial Nerves: No cranial nerve deficit.     Sensory: No sensory deficit.     Motor: No weakness.     Coordination: Coordination normal.     Gait: Gait normal.     Deep Tendon Reflexes: Reflexes normal.  Psychiatric:        Mood and Affect: Mood normal.  Behavior: Behavior normal.         Thought Content: Thought content normal.        Judgment: Judgment normal.    LABS:      Latest Ref Rng & Units 10/19/2022   10:50 AM 07/19/2022   10:15 AM 04/18/2022    9:25 AM  CBC  WBC 4.0 - 10.5 K/uL 4.0  6.2  6.0   Hemoglobin 12.0 - 15.0 g/dL 16.1  09.6  04.5   Hematocrit 36.0 - 46.0 % 41.7  41.3  42.7   Platelets 150 - 400 K/uL 124  128  122       Latest Ref Rng & Units 10/19/2022   10:50 AM 07/19/2022   10:15 AM 04/18/2022    9:25 AM  CMP  Glucose 70 - 99 mg/dL 97  409  811   BUN 8 - 23 mg/dL 23  19  24    Creatinine 0.44 - 1.00 mg/dL 9.14  7.82  9.56   Sodium 135 - 145 mmol/L 141  143  142   Potassium 3.5 - 5.1 mmol/L 3.8  4.0  3.6   Chloride 98 - 111 mmol/L 102  104  106   CO2 22 - 32 mmol/L 28  30  29    Calcium 8.9 - 10.3 mg/dL 9.7  9.4  9.0   Total Protein 6.5 - 8.1 g/dL 6.9  7.0  6.8   Total Bilirubin 0.3 - 1.2 mg/dL 1.1  1.1  1.3   Alkaline Phos 38 - 126 U/L 71  69  65   AST 15 - 41 U/L 33  24  44   ALT 0 - 44 U/L 22  17  42    Component Ref Range & Units 1 mo ago  WBC 4.40 - 11.00 10*3/uL 5.30  RBC 4.10 - 5.10 10*6/uL 4.12  Hemoglobin 12.3 - 15.3 g/dL 21.3  Hematocrit 08.6 - 44.6 % 39.0  Mean Corpuscular Volume (MCV) 80.0 - 96.0 fL 94.6  Mean Corpuscular Hemoglobin (MCH) 27.5 - 33.2 pg 31.8  Mean Corpuscular Hemoglobin Conc (MCHC) 33.0 - 37.0 g/dL 57.8  Red Cell Distribution Width (RDW) 12.3 - 17.0 % 14.6  Platelet Count (PLT) 150 - 450 10*3/uL 115 Low    Component Ref Range & Units 08/29/2022  Sodium 136 - 145 mmol/L 140  Potassium 3.5 - 5.1 mmol/L 4.2  Chloride 98 - 107 mmol/L 103  CO2 21 - 31 mmol/L 30  Anion Gap 6 - 14 mmol/L 7  Glucose, Random 70 - 99 mg/dL 87  Blood Urea Nitrogen (BUN) 7 - 25 mg/dL 23  Creatinine 4.69 - 6.29 mg/dL 5.28  eGFR >41 LK/GMW/1.02V2 62  Albumin 3.5 - 5.7 g/dL 3.8  Total Protein 6.4 - 8.9 g/dL 6.3 Low   Bilirubin, Total 0.3 - 1.0 mg/dL 1.0  Alkaline Phosphatase (ALP) 34 - 104 U/L 80  Aspartate  Aminotransferase (AST) 13 - 39 U/L 35  Alanine Aminotransferase (ALT) 7 - 52 U/L 40  Calcium 8.6 - 10.3 mg/dL 9.1   Component Ref Range & Units 1 mo ago  TSH 0.450 - 5.330 uIU/mL 2.014   Component Ref Range & Units 1 mo ago  Magnesium 1.9 - 2.7 mg/dL 2.1     No results found for: "CEA1", "CEA" / No results found for: "CEA1", "CEA" No results found for: "PSA1" No results found for: "ZDG644" No results found for: "CAN125"  No results found for: "TOTALPROTELP", "ALBUMINELP", "A1GS", "A2GS", "BETS", "BETA2SER", "GAMS", "MSPIKE", "SPEI" No results  found for: "TIBC", "FERRITIN", "IRONPCTSAT" No results found for: "LDH"  STUDIES:  No results found.    HISTORY:   Past Medical History:  Diagnosis Date   Family history of breast cancer 02/17/2022   H/O mitral valve repair    HTN (hypertension)    Status post left breast lumpectomy     Past Surgical History:  Procedure Laterality Date   TONSILLECTOMY      Family History  Problem Relation Age of Onset   Cancer Mother    Cancer Maternal Aunt     Social History:  reports that she has never smoked. She has never used smokeless tobacco. She reports that she does not drink alcohol and does not use drugs.The patient is accompanied by her husband today.  Allergies: No Known Allergies  Current Medications: Current Outpatient Medications  Medication Sig Dispense Refill   amLODipine (NORVASC) 2.5 MG tablet Take 2.5 mg by mouth daily.     amoxicillin (AMOXIL) 500 MG capsule Prn prior to dentist visit with hx of mitral valve prolapse (Patient not taking: Reported on 11/29/2021)     anastrozole (ARIMIDEX) 1 MG tablet Take 1 tablet (1 mg total) by mouth daily. 90 tablet 3   Calcium Citrate-Vitamin D 315-5 MG-MCG TABS Take 1 tablet by mouth daily.     Cholecalciferol 25 MCG (1000 UT) capsule Take by mouth.     Coenzyme Q10 50 MG CAPS Take by mouth.     docusate sodium (COLACE) 100 MG capsule Take 100 mg by mouth 2 (two) times  daily.     FLUAD QUADRIVALENT 0.5 ML injection      glucosamine-chondroitin 500-400 MG tablet Take 1 tablet by mouth daily.     levocetirizine (XYZAL) 5 MG tablet Take 5 mg by mouth daily.     losartan (COZAAR) 100 MG tablet Take 100 mg by mouth daily.     metoprolol tartrate (LOPRESSOR) 25 MG tablet Take by mouth 2 (two) times daily.     multivitamin-lutein (OCUVITE-LUTEIN) CAPS capsule Take 1 capsule by mouth daily.     ondansetron (ZOFRAN) 8 MG tablet Take 1 tablet (8 mg total) by mouth 2 (two) times daily as needed for refractory nausea / vomiting (Start on day 3 after chemotherapy). 30 tablet 1   prochlorperazine (COMPAZINE) 10 MG tablet Take 1 tablet (10 mg total) by mouth every 6 (six) hours as needed for nausea or vomiting. 30 tablet 1   simvastatin (ZOCOR) 20 MG tablet Take 20 mg by mouth at bedtime.     No current facility-administered medications for this visit.      I,Jasmine M Lassiter,acting as a scribe for Dellia Beckwith, MD.,have documented all relevant documentation on the behalf of Dellia Beckwith, MD,as directed by  Dellia Beckwith, MD while in the presence of Dellia Beckwith, MD.

## 2022-10-20 ENCOUNTER — Telehealth: Payer: Self-pay | Admitting: Oncology

## 2022-10-20 NOTE — Telephone Encounter (Signed)
Patient has been scheduled. Aware of appt date and time  

## 2022-10-21 DIAGNOSIS — I10 Essential (primary) hypertension: Secondary | ICD-10-CM | POA: Diagnosis not present

## 2022-10-21 DIAGNOSIS — R002 Palpitations: Secondary | ICD-10-CM | POA: Diagnosis not present

## 2022-10-23 DIAGNOSIS — I10 Essential (primary) hypertension: Secondary | ICD-10-CM | POA: Diagnosis not present

## 2022-10-23 DIAGNOSIS — I493 Ventricular premature depolarization: Secondary | ICD-10-CM | POA: Diagnosis not present

## 2022-10-23 DIAGNOSIS — I491 Atrial premature depolarization: Secondary | ICD-10-CM | POA: Diagnosis not present

## 2022-10-23 DIAGNOSIS — I4719 Other supraventricular tachycardia: Secondary | ICD-10-CM | POA: Diagnosis not present

## 2022-10-30 DIAGNOSIS — E782 Mixed hyperlipidemia: Secondary | ICD-10-CM | POA: Diagnosis not present

## 2022-10-30 DIAGNOSIS — I480 Paroxysmal atrial fibrillation: Secondary | ICD-10-CM | POA: Diagnosis not present

## 2022-10-30 DIAGNOSIS — Z9889 Other specified postprocedural states: Secondary | ICD-10-CM | POA: Diagnosis not present

## 2022-10-30 DIAGNOSIS — I340 Nonrheumatic mitral (valve) insufficiency: Secondary | ICD-10-CM | POA: Diagnosis not present

## 2022-10-30 DIAGNOSIS — I10 Essential (primary) hypertension: Secondary | ICD-10-CM | POA: Diagnosis not present

## 2022-10-30 DIAGNOSIS — I341 Nonrheumatic mitral (valve) prolapse: Secondary | ICD-10-CM | POA: Diagnosis not present

## 2022-11-07 ENCOUNTER — Encounter: Payer: Self-pay | Admitting: Oncology

## 2022-11-09 ENCOUNTER — Telehealth: Payer: Self-pay

## 2022-11-09 NOTE — Telephone Encounter (Signed)
-----   Message from Dellia Beckwith sent at 11/07/2022  7:35 PM EDT ----- Regarding: call Tell her platelet count stable at 124,000 and everything else was normal

## 2022-11-09 NOTE — Telephone Encounter (Signed)
Attempted to contact patient. No answer. 

## 2022-11-13 DIAGNOSIS — Z79899 Other long term (current) drug therapy: Secondary | ICD-10-CM | POA: Diagnosis not present

## 2022-11-13 DIAGNOSIS — I491 Atrial premature depolarization: Secondary | ICD-10-CM | POA: Diagnosis not present

## 2022-11-13 DIAGNOSIS — I493 Ventricular premature depolarization: Secondary | ICD-10-CM | POA: Diagnosis not present

## 2022-11-13 DIAGNOSIS — I10 Essential (primary) hypertension: Secondary | ICD-10-CM | POA: Diagnosis not present

## 2022-11-13 DIAGNOSIS — Z9889 Other specified postprocedural states: Secondary | ICD-10-CM | POA: Diagnosis not present

## 2022-11-13 DIAGNOSIS — I480 Paroxysmal atrial fibrillation: Secondary | ICD-10-CM | POA: Diagnosis not present

## 2022-11-13 DIAGNOSIS — I340 Nonrheumatic mitral (valve) insufficiency: Secondary | ICD-10-CM | POA: Diagnosis not present

## 2022-11-13 DIAGNOSIS — I495 Sick sinus syndrome: Secondary | ICD-10-CM | POA: Diagnosis not present

## 2022-11-13 DIAGNOSIS — I4719 Other supraventricular tachycardia: Secondary | ICD-10-CM | POA: Diagnosis not present

## 2022-12-04 DIAGNOSIS — D225 Melanocytic nevi of trunk: Secondary | ICD-10-CM | POA: Diagnosis not present

## 2022-12-04 DIAGNOSIS — D485 Neoplasm of uncertain behavior of skin: Secondary | ICD-10-CM | POA: Diagnosis not present

## 2022-12-26 DIAGNOSIS — Z0000 Encounter for general adult medical examination without abnormal findings: Secondary | ICD-10-CM | POA: Diagnosis not present

## 2022-12-26 DIAGNOSIS — Z131 Encounter for screening for diabetes mellitus: Secondary | ICD-10-CM | POA: Diagnosis not present

## 2022-12-26 DIAGNOSIS — N1831 Chronic kidney disease, stage 3a: Secondary | ICD-10-CM | POA: Diagnosis not present

## 2022-12-26 DIAGNOSIS — C50912 Malignant neoplasm of unspecified site of left female breast: Secondary | ICD-10-CM | POA: Diagnosis not present

## 2022-12-26 DIAGNOSIS — Z23 Encounter for immunization: Secondary | ICD-10-CM | POA: Diagnosis not present

## 2022-12-26 DIAGNOSIS — M8589 Other specified disorders of bone density and structure, multiple sites: Secondary | ICD-10-CM | POA: Diagnosis not present

## 2022-12-26 DIAGNOSIS — D696 Thrombocytopenia, unspecified: Secondary | ICD-10-CM | POA: Diagnosis not present

## 2022-12-26 DIAGNOSIS — I480 Paroxysmal atrial fibrillation: Secondary | ICD-10-CM | POA: Diagnosis not present

## 2022-12-26 DIAGNOSIS — E7800 Pure hypercholesterolemia, unspecified: Secondary | ICD-10-CM | POA: Diagnosis not present

## 2022-12-26 LAB — HM DEXA SCAN

## 2023-01-08 DIAGNOSIS — Z9889 Other specified postprocedural states: Secondary | ICD-10-CM | POA: Diagnosis not present

## 2023-01-08 DIAGNOSIS — E782 Mixed hyperlipidemia: Secondary | ICD-10-CM | POA: Diagnosis not present

## 2023-01-08 DIAGNOSIS — I340 Nonrheumatic mitral (valve) insufficiency: Secondary | ICD-10-CM | POA: Diagnosis not present

## 2023-01-08 DIAGNOSIS — I341 Nonrheumatic mitral (valve) prolapse: Secondary | ICD-10-CM | POA: Diagnosis not present

## 2023-01-08 DIAGNOSIS — I10 Essential (primary) hypertension: Secondary | ICD-10-CM | POA: Diagnosis not present

## 2023-01-08 DIAGNOSIS — I480 Paroxysmal atrial fibrillation: Secondary | ICD-10-CM | POA: Diagnosis not present

## 2023-01-17 ENCOUNTER — Other Ambulatory Visit: Payer: Medicare HMO

## 2023-01-17 ENCOUNTER — Ambulatory Visit: Payer: Medicare HMO | Admitting: Oncology

## 2023-02-07 DIAGNOSIS — I499 Cardiac arrhythmia, unspecified: Secondary | ICD-10-CM | POA: Diagnosis not present

## 2023-02-07 DIAGNOSIS — I10 Essential (primary) hypertension: Secondary | ICD-10-CM | POA: Diagnosis not present

## 2023-02-07 DIAGNOSIS — I480 Paroxysmal atrial fibrillation: Secondary | ICD-10-CM | POA: Diagnosis not present

## 2023-02-07 DIAGNOSIS — I340 Nonrheumatic mitral (valve) insufficiency: Secondary | ICD-10-CM | POA: Diagnosis not present

## 2023-02-14 DIAGNOSIS — H40013 Open angle with borderline findings, low risk, bilateral: Secondary | ICD-10-CM | POA: Diagnosis not present

## 2023-02-14 DIAGNOSIS — H5203 Hypermetropia, bilateral: Secondary | ICD-10-CM | POA: Diagnosis not present

## 2023-02-22 ENCOUNTER — Encounter: Payer: Self-pay | Admitting: Hematology and Oncology

## 2023-02-22 ENCOUNTER — Inpatient Hospital Stay: Payer: Medicare HMO | Attending: Oncology

## 2023-02-22 ENCOUNTER — Inpatient Hospital Stay: Payer: Medicare HMO | Admitting: Hematology and Oncology

## 2023-02-22 VITALS — BP 123/77 | HR 79 | Temp 97.6°F | Resp 20 | Ht 64.0 in | Wt 133.7 lb

## 2023-02-22 DIAGNOSIS — C50412 Malignant neoplasm of upper-outer quadrant of left female breast: Secondary | ICD-10-CM | POA: Diagnosis not present

## 2023-02-22 DIAGNOSIS — Z170 Estrogen receptor positive status [ER+]: Secondary | ICD-10-CM

## 2023-02-22 DIAGNOSIS — M85859 Other specified disorders of bone density and structure, unspecified thigh: Secondary | ICD-10-CM | POA: Insufficient documentation

## 2023-02-22 DIAGNOSIS — D696 Thrombocytopenia, unspecified: Secondary | ICD-10-CM

## 2023-02-22 DIAGNOSIS — D6959 Other secondary thrombocytopenia: Secondary | ICD-10-CM | POA: Diagnosis not present

## 2023-02-22 DIAGNOSIS — T451X5A Adverse effect of antineoplastic and immunosuppressive drugs, initial encounter: Secondary | ICD-10-CM | POA: Insufficient documentation

## 2023-02-22 DIAGNOSIS — Z79811 Long term (current) use of aromatase inhibitors: Secondary | ICD-10-CM | POA: Diagnosis not present

## 2023-02-22 DIAGNOSIS — M8580 Other specified disorders of bone density and structure, unspecified site: Secondary | ICD-10-CM | POA: Diagnosis not present

## 2023-02-22 DIAGNOSIS — Z780 Asymptomatic menopausal state: Secondary | ICD-10-CM | POA: Diagnosis not present

## 2023-02-22 DIAGNOSIS — Z9221 Personal history of antineoplastic chemotherapy: Secondary | ICD-10-CM | POA: Insufficient documentation

## 2023-02-22 DIAGNOSIS — Z1732 Human epidermal growth factor receptor 2 negative status: Secondary | ICD-10-CM | POA: Insufficient documentation

## 2023-02-22 LAB — CBC WITH DIFFERENTIAL (CANCER CENTER ONLY)
Abs Immature Granulocytes: 0.01 10*3/uL (ref 0.00–0.07)
Basophils Absolute: 0 10*3/uL (ref 0.0–0.1)
Basophils Relative: 1 %
Eosinophils Absolute: 0.1 10*3/uL (ref 0.0–0.5)
Eosinophils Relative: 2 %
HCT: 38 % (ref 36.0–46.0)
Hemoglobin: 13.1 g/dL (ref 12.0–15.0)
Immature Granulocytes: 0 %
Lymphocytes Relative: 23 %
Lymphs Abs: 1 10*3/uL (ref 0.7–4.0)
MCH: 32.3 pg (ref 26.0–34.0)
MCHC: 34.5 g/dL (ref 30.0–36.0)
MCV: 93.8 fL (ref 80.0–100.0)
Monocytes Absolute: 0.6 10*3/uL (ref 0.1–1.0)
Monocytes Relative: 14 %
Neutro Abs: 2.6 10*3/uL (ref 1.7–7.7)
Neutrophils Relative %: 60 %
Platelet Count: 128 10*3/uL — ABNORMAL LOW (ref 150–400)
RBC: 4.05 MIL/uL (ref 3.87–5.11)
RDW: 13.2 % (ref 11.5–15.5)
WBC Count: 4.3 10*3/uL (ref 4.0–10.5)
nRBC: 0 % (ref 0.0–0.2)
nRBC: 0 /100{WBCs}

## 2023-02-22 LAB — CMP (CANCER CENTER ONLY)
ALT: 24 U/L (ref 0–44)
AST: 41 U/L (ref 15–41)
Albumin: 4.3 g/dL (ref 3.5–5.0)
Alkaline Phosphatase: 108 U/L (ref 38–126)
Anion gap: 10 (ref 5–15)
BUN: 22 mg/dL (ref 8–23)
CO2: 29 mmol/L (ref 22–32)
Calcium: 10 mg/dL (ref 8.9–10.3)
Chloride: 102 mmol/L (ref 98–111)
Creatinine: 0.96 mg/dL (ref 0.44–1.00)
GFR, Estimated: 58 mL/min — ABNORMAL LOW (ref >60)
Glucose, Bld: 119 mg/dL — ABNORMAL HIGH (ref 70–99)
Potassium: 3.6 mmol/L (ref 3.5–5.1)
Sodium: 140 mmol/L (ref 135–145)
Total Bilirubin: 0.6 mg/dL (ref <1.2)
Total Protein: 7.2 g/dL (ref 6.5–8.1)

## 2023-02-22 NOTE — Progress Notes (Cosign Needed)
[facility]  [address] [place],  [place]  [address] [phone]  Clinic Day:  02/22/2023  Referring physician: Buckner Malta, MD  ASSESSMENT & PLAN:   Assessment & Plan: Breast cancer of upper-outer quadrant of left female breast (HCC) Stage IIB (T2 N0 M0) grade 3, infiltrating ductal carcinoma of the left breast diagnosed in May 2023. She was treated with lumpectomy. Pathology revealed a 28 mm with 2 negative nodes in the background of high-grade ductal carcinoma in situ.  Margins were clear.  Estrogen receptors positive at 30% and progesterone receptors are negative and HER2 negative.  Ki-67 was 60%.    She was felt to be at higher risk for recurrence, so we discussed chemotherapy such as TC for 4 cycles.  However, we would have to take into consideration her advanced age of 22.  Her EndoPredict score then came back at 5.2, correlating with a 48% risk of distant recurrence in the next 10 years. She would have 24% benefit with chemotherapy.  It was decided to give her adjuvant CMF chemotherapy for 8 cycles, which she completed in January 2024. She was then placed on anastrozole 1 mg daily in February.  She remains without evidence of recurrence. She knows to continue anastrozole daily for at least a total of 5 years. We will plan to see her back in 3 months with a CBC and comprehensive metabolic panel.   Thrombocytopenia (HCC) Mild, chronic thrombocytopenia since chemotherapy, which is stable. We will continue to monitor this.  Osteopenia after menopause Bone density scan in September 2022 revealed osteopenia of the hip with a T score of -2.0. The spine readings were normal. She continues calcium and vitamin D daily.  She was offered alendronate, but declined. She states she had a bone density at [facility] this year, so we will request the report.    The patient understands the plans discussed today and is in agreement with them.  She knows to  contact our office if she develops concerns prior to her next appointment.   I provided 40 minutes of face-to-face time during this encounter and > 50% was spent counseling as documented under my assessment and plan.    Ashley Perl, PA-C  [facility] [place] [facility] [address] [place] [address] Dept: [phone] Dept Fax: [phone]   No orders of the defined types were placed in this encounter.     CHIEF COMPLAINT:  CC: Stage IIB hormone receptor positive breast cancer  Current Treatment:  Anastrozole 1 mg daily  HISTORY OF PRESENT ILLNESS:   Oncology History  Breast cancer of upper-outer quadrant of left female breast (HCC)  06/01/2021 Initial Diagnosis   Breast cancer of upper-outer quadrant of left female breast (HCC)   07/13/2021 Cancer Staging   Staging form: Breast, AJCC 8th Edition - Clinical stage from 07/13/2021: Stage IIB (cT2, cN0(sn), cM0, G3, ER+, PR-, HER2-) - Signed by Ashley Beckwith, MD on 08/10/2021 Histopathologic type: Infiltrating duct carcinoma, NOS Stage prefix: Initial diagnosis Method of lymph node assessment: Sentinel lymph node biopsy Nuclear grade: G3 Histologic grading system: 3 grade system Laterality: Left Tumor size (mm): 28 Lymph-vascular invasion (LVI): LVI not present (absent)/not identified Diagnostic confirmation: Positive histology Specimen type: Excision Staged by: Managing physician Percentage of positive estrogen receptors (%): 30 Percentage of positive progesterone receptors (%): 0 HER2-IHC interpretation: Negative HER2-IHC value: Score 1+ Menopausal status: Postmenopausal  Ki-67 (%): 60 Stage used in treatment planning: Yes National guidelines used in treatment planning: Yes Type of national guideline used in treatment planning: NCCN   09/29/2021 - 10/27/2021 Chemotherapy   Patient is on Treatment Plan : BREAST Adjuvant CMF IV q21d     09/29/2021  - 03/16/2022 Chemotherapy   Patient is on Treatment Plan : BREAST Adjuvant CMF IV q21d         INTERVAL HISTORY:  [facility] is here today for repeat clinical assessment. She states she continues anastrozole daily without significant difficulty. She denies hot flashes, vaginal discharge or bleeding. She has moderate generalized joint pain, which is fairly stable. She reports shortness of breath with exertion which is stable. She denies cough, chest pain, palpitations or edema. She reports tingling and numbness of her fingers, toes, feet and legs, which is stable. She denies fevers or chills. She denies pain. Her appetite is good. Her weight has increased 3 pounds over last 4 months .  REVIEW OF SYSTEMS:  Review of Systems  Constitutional:  Negative for appetite change, chills, diaphoresis, fatigue, fever and unexpected weight change.  HENT:   Negative for lump/mass, mouth sores and sore throat.   Respiratory:  Positive for shortness of breath (with exertion). Negative for cough.   Cardiovascular:  Negative for chest pain, leg swelling and palpitations.  Gastrointestinal:  Positive for constipation (intermittent). Negative for abdominal pain, diarrhea, nausea and vomiting.  Endocrine: Negative for hot flashes.  Genitourinary:  Negative for difficulty urinating, dysuria, frequency, hematuria, vaginal bleeding and vaginal discharge.   Musculoskeletal:  Negative for arthralgias, back pain, gait problem and myalgias.  Skin:  Negative for rash.  Neurological:  Positive for numbness (bilateral fingers, toes, feet and legs). Negative for dizziness, extremity weakness, gait problem, headaches and light-headedness.  Hematological:  Negative for adenopathy. Does not bruise/bleed easily.  Psychiatric/Behavioral:  Negative for depression and sleep disturbance. The patient is not nervous/anxious.      VITALS:  Blood pressure 123/77, pulse 79, temperature 97.6 F (36.4 C), temperature source Oral, resp. rate  20, height 5\' 4"  (1.626 m), weight 133 lb 11.2 oz (60.6 kg), SpO2 98%.  Wt Readings from Last 3 Encounters:  02/22/23 133 lb 11.2 oz (60.6 kg)  10/19/22 130 lb 4.8 oz (59.1 kg)  07/19/22 128 lb 6.4 oz (58.2 kg)    Body mass index is 22.95 kg/m.  Performance status (ECOG): 2 - Symptomatic, <50% confined to bed  PHYSICAL EXAM:  Physical Exam Vitals and nursing note reviewed.  Constitutional:      General: She is not in acute distress.    Appearance: Normal appearance.  HENT:     Head: Normocephalic and atraumatic.     Mouth/Throat:     Mouth: Mucous membranes are moist.     Pharynx: Oropharynx is clear. No oropharyngeal exudate or posterior oropharyngeal erythema.  Eyes:     General: No scleral icterus.    Extraocular Movements: Extraocular movements intact.     Conjunctiva/sclera: Conjunctivae normal.     Pupils: Pupils are equal, round, and reactive to light.  Cardiovascular:     Rate and Rhythm: Normal rate and regular rhythm.     Heart sounds: Normal heart sounds. No murmur heard.    No friction rub. No gallop.  Pulmonary:     Effort: Pulmonary effort is normal.     Breath sounds: Normal breath sounds. No wheezing, rhonchi or rales.  Chest:  Breasts:    Right: Normal. No swelling, bleeding, inverted nipple,  mass, nipple discharge, skin change or tenderness.     Left: Normal. No swelling, bleeding, inverted nipple, mass, nipple discharge, skin change or tenderness.  Abdominal:     General: There is no distension.     Palpations: Abdomen is soft. There is no mass.     Tenderness: There is no abdominal tenderness.  Musculoskeletal:        General: Normal range of motion.     Cervical back: Normal range of motion and neck supple. No tenderness.     Right lower leg: No edema.     Left lower leg: No edema.  Lymphadenopathy:     Cervical: No cervical adenopathy.  Skin:    General: Skin is warm and dry.     Coloration: Skin is not jaundiced.     Findings: No rash.   Neurological:     Mental Status: She is alert and oriented to person, place, and time.     Cranial Nerves: No cranial nerve deficit.  Psychiatric:        Mood and Affect: Mood normal.        Behavior: Behavior normal.        Thought Content: Thought content normal.     LABS:      Latest Ref Rng & Units 02/22/2023    2:56 PM 10/19/2022   10:50 AM 07/19/2022   10:15 AM  CBC  WBC 4.0 - 10.5 K/uL 4.3  4.0  6.2   Hemoglobin 12.0 - 15.0 g/dL 96.2  95.2  84.1   Hematocrit 36.0 - 46.0 % 38.0  41.7  41.3   Platelets 150 - 400 K/uL 128  124  128       Latest Ref Rng & Units 02/22/2023    2:56 PM 10/19/2022   10:50 AM 07/19/2022   10:15 AM  CMP  Glucose 70 - 99 mg/dL 324  97  401   BUN 8 - 23 mg/dL 22  23  19    Creatinine 0.44 - 1.00 mg/dL 0.27  2.53  6.64   Sodium 135 - 145 mmol/L 140  141  143   Potassium 3.5 - 5.1 mmol/L 3.6  3.8  4.0   Chloride 98 - 111 mmol/L 102  102  104   CO2 22 - 32 mmol/L 29  28  30    Calcium 8.9 - 10.3 mg/dL 40.3  9.7  9.4   Total Protein 6.5 - 8.1 g/dL 7.2  6.9  7.0   Total Bilirubin <1.2 mg/dL 0.6  1.1  1.1   Alkaline Phos 38 - 126 U/L 108  71  69   AST 15 - 41 U/L 41  33  24   ALT 0 - 44 U/L 24  22  17      STUDIES:  No results found.    HISTORY:   Past Medical History:  Diagnosis Date   Family history of breast cancer 02/17/2022   H/O mitral valve repair    HTN (hypertension)    Status post left breast lumpectomy     Past Surgical History:  Procedure Laterality Date   TONSILLECTOMY      Family History  Problem Relation Age of Onset   Cancer Mother    Cancer Maternal Aunt     Social History:  reports that she has never smoked. She has never used smokeless tobacco. She reports that she does not drink alcohol and does not use drugs.The patient is alone today.  Allergies: No Known Allergies  Current  Medications: Current Outpatient Medications  Medication Sig Dispense Refill   amLODipine (NORVASC) 2.5 MG tablet Take 1 tablet by  mouth daily.     fluticasone (FLONASE) 50 MCG/ACT nasal spray Place into both nostrils.     amoxicillin (AMOXIL) 500 MG capsule Prn prior to dentist visit with hx of mitral valve prolapse (Patient not taking: Reported on 02/22/2023)     anastrozole (ARIMIDEX) 1 MG tablet Take 1 tablet (1 mg total) by mouth daily. 90 tablet 3   Calcium Citrate-Vitamin D 315-5 MG-MCG TABS Take 1 tablet by mouth daily.     Cholecalciferol 25 MCG (1000 UT) capsule Take by mouth.     Coenzyme Q10 50 MG CAPS Take by mouth.     diltiazem (CARDIZEM CD) 120 MG 24 hr capsule Take 120 mg by mouth daily.     docusate sodium (COLACE) 100 MG capsule Take 100 mg by mouth 2 (two) times daily. (Patient not taking: Reported on 02/22/2023)     FLUAD QUADRIVALENT 0.5 ML injection      glucosamine-chondroitin 500-400 MG tablet Take 1 tablet by mouth daily.     levocetirizine (XYZAL) 5 MG tablet Take 5 mg by mouth daily.     losartan (COZAAR) 100 MG tablet Take 100 mg by mouth daily.     multivitamin-lutein (OCUVITE-LUTEIN) CAPS capsule Take 1 capsule by mouth daily.     ondansetron (ZOFRAN) 8 MG tablet Take 1 tablet (8 mg total) by mouth 2 (two) times daily as needed for refractory nausea / vomiting (Start on day 3 after chemotherapy). 30 tablet 1   prochlorperazine (COMPAZINE) 10 MG tablet Take 1 tablet (10 mg total) by mouth every 6 (six) hours as needed for nausea or vomiting. 30 tablet 1   simvastatin (ZOCOR) 20 MG tablet Take 20 mg by mouth at bedtime.     No current facility-administered medications for this visit.

## 2023-02-23 ENCOUNTER — Telehealth: Payer: Self-pay | Admitting: Oncology

## 2023-02-23 NOTE — Telephone Encounter (Signed)
Contacted pt to schedule an appt. Unable to reach via phone, voicemail was left.    Follow-Up Information  Follow-up disposition: Return in about 3 months (around 05/23/2023).  Check out comments: Labs and f/u with Dr. Shyrl Numbers in 3 months

## 2023-02-26 ENCOUNTER — Encounter: Payer: Self-pay | Admitting: Oncology

## 2023-02-26 ENCOUNTER — Encounter: Payer: Self-pay | Admitting: Hematology and Oncology

## 2023-02-26 NOTE — Assessment & Plan Note (Signed)
Bone density scan in September 2022 revealed osteopenia of the hip with a T score of -2.0. The spine readings were normal. She continues calcium and vitamin D daily.  She was offered alendronate, but declined. She states she had a bone density at [facility] this year, so we will request the report.

## 2023-02-26 NOTE — Assessment & Plan Note (Addendum)
Stage IIB (T2 N0 M0) grade 3, infiltrating ductal carcinoma of the left breast diagnosed in May 2023. She was treated with lumpectomy. Pathology revealed a 28 mm with 2 negative nodes in the background of high-grade ductal carcinoma in situ.  Margins were clear.  Estrogen receptors positive at 30% and progesterone receptors are negative and HER2 negative.  Ki-67 was 60%.    She was felt to be at higher risk for recurrence, so we discussed chemotherapy such as TC for 4 cycles.  However, we would have to take into consideration her advanced age of 51.  Her EndoPredict score then came back at 5.2, correlating with a 48% risk of distant recurrence in the next 10 years. She would have 24% benefit with chemotherapy.  It was decided to give her adjuvant CMF chemotherapy for 8 cycles, which she completed in January 2024. She was then placed on anastrozole 1 mg daily in February.  She remains without evidence of recurrence. Her joint pain may be exacerbated by the anastrozole, but she wishes to continue. We recommend at least a total of 5 years as long as tolerated. We will plan to see her back in 3 months with a CBC and comprehensive metabolic panel.

## 2023-02-26 NOTE — Assessment & Plan Note (Signed)
Mild, chronic thrombocytopenia since chemotherapy, which is stable. We will continue to monitor this.

## 2023-03-02 ENCOUNTER — Telehealth: Payer: Self-pay

## 2023-03-02 NOTE — Telephone Encounter (Signed)
Detailed message left with medical record dept to fax report.

## 2023-03-02 NOTE — Telephone Encounter (Signed)
-----   Message from [name] sent at 02/22/2023  4:23 PM EST ----- Please request dexa from Dr. Waverly Ferrari office this year

## 2023-03-15 DIAGNOSIS — I341 Nonrheumatic mitral (valve) prolapse: Secondary | ICD-10-CM | POA: Diagnosis not present

## 2023-03-15 DIAGNOSIS — I340 Nonrheumatic mitral (valve) insufficiency: Secondary | ICD-10-CM | POA: Diagnosis not present

## 2023-03-15 DIAGNOSIS — I480 Paroxysmal atrial fibrillation: Secondary | ICD-10-CM | POA: Diagnosis not present

## 2023-03-15 DIAGNOSIS — I10 Essential (primary) hypertension: Secondary | ICD-10-CM | POA: Diagnosis not present

## 2023-03-15 DIAGNOSIS — Z9889 Other specified postprocedural states: Secondary | ICD-10-CM | POA: Diagnosis not present

## 2023-05-10 DIAGNOSIS — I083 Combined rheumatic disorders of mitral, aortic and tricuspid valves: Secondary | ICD-10-CM | POA: Diagnosis not present

## 2023-05-10 DIAGNOSIS — Z95818 Presence of other cardiac implants and grafts: Secondary | ICD-10-CM | POA: Diagnosis not present

## 2023-05-10 DIAGNOSIS — I272 Pulmonary hypertension, unspecified: Secondary | ICD-10-CM | POA: Diagnosis not present

## 2023-05-16 DIAGNOSIS — I10 Essential (primary) hypertension: Secondary | ICD-10-CM | POA: Diagnosis not present

## 2023-05-16 DIAGNOSIS — I340 Nonrheumatic mitral (valve) insufficiency: Secondary | ICD-10-CM | POA: Diagnosis not present

## 2023-05-16 DIAGNOSIS — I341 Nonrheumatic mitral (valve) prolapse: Secondary | ICD-10-CM | POA: Diagnosis not present

## 2023-05-16 DIAGNOSIS — I480 Paroxysmal atrial fibrillation: Secondary | ICD-10-CM | POA: Diagnosis not present

## 2023-05-16 DIAGNOSIS — Z9889 Other specified postprocedural states: Secondary | ICD-10-CM | POA: Diagnosis not present

## 2023-05-23 ENCOUNTER — Inpatient Hospital Stay: Payer: Medicare HMO | Attending: Oncology | Admitting: Oncology

## 2023-05-23 ENCOUNTER — Inpatient Hospital Stay: Payer: Medicare HMO

## 2023-05-23 ENCOUNTER — Other Ambulatory Visit: Payer: Self-pay | Admitting: Oncology

## 2023-05-23 VITALS — BP 140/91 | HR 70 | Temp 97.6°F | Resp 18 | Ht 64.0 in | Wt 132.7 lb

## 2023-05-23 DIAGNOSIS — Z170 Estrogen receptor positive status [ER+]: Secondary | ICD-10-CM

## 2023-05-23 DIAGNOSIS — D696 Thrombocytopenia, unspecified: Secondary | ICD-10-CM | POA: Insufficient documentation

## 2023-05-23 DIAGNOSIS — C50412 Malignant neoplasm of upper-outer quadrant of left female breast: Secondary | ICD-10-CM | POA: Insufficient documentation

## 2023-05-23 DIAGNOSIS — Z79811 Long term (current) use of aromatase inhibitors: Secondary | ICD-10-CM | POA: Diagnosis not present

## 2023-05-23 DIAGNOSIS — M85859 Other specified disorders of bone density and structure, unspecified thigh: Secondary | ICD-10-CM | POA: Diagnosis not present

## 2023-05-23 LAB — CBC WITH DIFFERENTIAL (CANCER CENTER ONLY)
Abs Immature Granulocytes: 0.01 10*3/uL (ref 0.00–0.07)
Basophils Absolute: 0 10*3/uL (ref 0.0–0.1)
Basophils Relative: 1 %
Eosinophils Absolute: 0.1 10*3/uL (ref 0.0–0.5)
Eosinophils Relative: 2 %
HCT: 41.4 % (ref 36.0–46.0)
Hemoglobin: 13.7 g/dL (ref 12.0–15.0)
Immature Granulocytes: 0 %
Immature Platelet Fraction: 2.2 % (ref 1.2–8.6)
Lymphocytes Relative: 19 %
Lymphs Abs: 1 10*3/uL (ref 0.7–4.0)
MCH: 31.2 pg (ref 26.0–34.0)
MCHC: 33.1 g/dL (ref 30.0–36.0)
MCV: 94.3 fL (ref 80.0–100.0)
Monocytes Absolute: 0.6 10*3/uL (ref 0.1–1.0)
Monocytes Relative: 11 %
Neutro Abs: 3.6 10*3/uL (ref 1.7–7.7)
Neutrophils Relative %: 67 %
Platelet Count: 137 10*3/uL — ABNORMAL LOW (ref 150–400)
RBC: 4.39 MIL/uL (ref 3.87–5.11)
RDW: 12.6 % (ref 11.5–15.5)
WBC Count: 5.4 10*3/uL (ref 4.0–10.5)
nRBC: 0 % (ref 0.0–0.2)
nRBC: 0 /100{WBCs}

## 2023-05-23 LAB — CMP (CANCER CENTER ONLY)
ALT: 18 U/L (ref 0–44)
AST: 34 U/L (ref 15–41)
Albumin: 4.3 g/dL (ref 3.5–5.0)
Alkaline Phosphatase: 118 U/L (ref 38–126)
Anion gap: 9 (ref 5–15)
BUN: 25 mg/dL — ABNORMAL HIGH (ref 8–23)
CO2: 29 mmol/L (ref 22–32)
Calcium: 10 mg/dL (ref 8.9–10.3)
Chloride: 103 mmol/L (ref 98–111)
Creatinine: 1 mg/dL (ref 0.44–1.00)
GFR, Estimated: 56 mL/min — ABNORMAL LOW (ref >60)
Glucose, Bld: 88 mg/dL (ref 70–99)
Potassium: 3.9 mmol/L (ref 3.5–5.1)
Sodium: 141 mmol/L (ref 135–145)
Total Bilirubin: 0.6 mg/dL (ref 0.0–1.2)
Total Protein: 7.2 g/dL (ref 6.5–8.1)

## 2023-05-23 NOTE — Progress Notes (Signed)
 [facility]  [address] [place],  [place]  [address] [phone]  Clinic Day: 05/23/23  Referring physician: Buckner Malta, MD  ASSESSMENT & PLAN:  Assessment: Breast cancer of upper-outer quadrant of left female breast (HCC) Stage IIB (T2 N0 M0) grade 3, infiltrating ductal carcinoma of the left breast diagnosed in May, 2023. She was treated with lumpectomy. Pathology revealed a 28 mm with 2 negative nodes in the background of high-grade ductal carcinoma in situ.  Margins were clear.  Estrogen receptors positive at 30% and progesterone receptors are negative and HER2 negative.  Ki-67 was 60%.    She was felt to be at higher risk for recurrence, so we discussed chemotherapy such as TC for 4 cycles.  However, we would have to take into consideration her advanced age of 63.  Her EndoPredict score then came back at 5.2, correlating with a 48% risk of distant recurrence in the next 10 years. She would have 24% benefit with chemotherapy.  It was decided to give her adjuvant CMF chemotherapy for 8 cycles, which she completed in January, 2024. She was then placed on anastrozole 1 mg daily in February. She remains without evidence of recurrence. She knows to continue anastrozole daily for at least a total of 5 years.    Thrombocytopenia (HCC) Mild, chronic thrombocytopenia since chemotherapy, which is stable. We will continue to monitor this.   Osteopenia after menopause Bone density scan in September 2022 revealed osteopenia of the hip with a T score of -2.0. The spine readings were normal. She continues calcium and vitamin D daily.  She was offered alendronate, but declined. She states she had a bone density at [facility] last  year.   Plan:   Her BP today is 140/91 and she informed me that she was taken off of a BP medication. I recommended she keep a BP log and monitor this at home, and follow up with her PCP and cardiologist. She also mentioned that when she  finished chemotherapy, she developed  leaking heart valve that was worse than the last time this occurred. This is being monitored by cardiology and she will receive a heart monitor in 3 weeks. Due to a shortage in radiologists her mammogram has been rescheduled to April 8th and so has her follow-up with Dr. Georgiana Shore on April 21st. She continues anastrozole daily without significant difficulty. Patient states that she had a bone density done in 2024 and I will look for those records. He has a WBC of 5.4, hemoglobin of 13.7, and a low platelet count of 137,000. Her CMP is normal other than a slightly elevated BUN of 25. She has an appointment with her PCP on April, 22nd. I will see her back in 4 months for reevaluation. The patient understands the plans discussed today and is in agreement with them.  She knows to contact our office if she develops concerns prior to her next appointment.  I provided 22 minutes of face-to-face time during this encounter and > 50% was spent counseling as documented under my assessment and plan.   Dellia Beckwith, MD [facility] - A DEPT OF [facility] [address] [place] [address] Dept: [phone] Dept Fax: [phone]   No orders of the defined types were placed in this encounter.  CHIEF COMPLAINT:  CC: Stage IIB hormone receptor positive breast cancer  Current Treatment:  Anastrozole 1 mg daily  HISTORY OF PRESENT  ILLNESS:   Oncology History  Breast cancer of upper-outer quadrant of left female breast (HCC)  06/01/2021 Initial Diagnosis   Breast cancer of upper-outer quadrant of left female breast (HCC)   07/13/2021 Cancer Staging   Staging form: Breast, AJCC 8th Edition - Clinical stage from 07/13/2021: Stage IIB (cT2, cN0(sn), cM0, G3, ER+, PR-, HER2-) - Signed by Dellia Beckwith, MD on 08/10/2021 Histopathologic type: Infiltrating duct carcinoma, NOS Stage prefix: Initial  diagnosis Method of lymph node assessment: Sentinel lymph node biopsy Nuclear grade: G3 Histologic grading system: 3 grade system Laterality: Left Tumor size (mm): 28 Lymph-vascular invasion (LVI): LVI not present (absent)/not identified Diagnostic confirmation: Positive histology Specimen type: Excision Staged by: Managing physician Percentage of positive estrogen receptors (%): 30 Percentage of positive progesterone receptors (%): 0 HER2-IHC interpretation: Negative HER2-IHC value: Score 1+ Menopausal status: Postmenopausal Ki-67 (%): 60 Stage used in treatment planning: Yes National guidelines used in treatment planning: Yes Type of national guideline used in treatment planning: NCCN   09/29/2021 - 10/27/2021 Chemotherapy   Patient is on Treatment Plan : BREAST Adjuvant CMF IV q21d     09/29/2021 - 03/16/2022 Chemotherapy   Patient is on Treatment Plan : BREAST Adjuvant CMF IV q21d       INTERVAL HISTORY:  Wayne is here today for repeat clinical assessment for her stage IIB hormone receptor positive breast cancer. Patient states that she feels well but complains of tingling in her legs and hands with occasional pain and itching of her legs. I explained that she could be developing neuropathy. Her BP today is 140/91 and she informed me that she was taken off of a BP medication. I recommended she keep a BP log and monitor this at home, and follow up with her PCP and cardiologist. She also mentioned that when she finished chemotherapy, she developed  leaking heart valve that was worse than the last time this occurred. This is being monitored cardiology and she will receive a heart monitor in 3 weeks. Due to a shortage in radiologists her mammogram has been rescheduled to April 8th and so has her follow-up with Dr. Georgiana Shore to April 21st. She continues anastrozole daily without significant difficulty. Patient states that she had a bone density done in 2024 and I will look for those records.  He has a WBC of 5.4, hemoglobin of 13.7, and a low platelet count of 137,000. Her CMP is normal other than a slightly elevated BUN of 25. She has an appointment with her PCP on April, 22nd. I will see her back in 4 months for reevaluation.   She denies signs of infection such as sore throat, sinus drainage, cough, or urinary symptoms.  She denies fevers or recurrent chills.  She denies nausea, vomiting, chest pain, or cough. Her appetite is good and her weight has decreased 1 pounds over last 3 months.   REVIEW OF SYSTEMS:  Review of Systems  Constitutional:  Negative for appetite change, chills, diaphoresis, fatigue, fever and unexpected weight change.  HENT:  Negative.  Negative for hearing loss, lump/mass, mouth sores, nosebleeds, sore throat, tinnitus, trouble swallowing and voice change.   Eyes:  Negative for eye problems and icterus.  Respiratory:  Positive for shortness of breath (with exertion). Negative for chest tightness, cough, hemoptysis and wheezing.   Cardiovascular:  Negative for chest pain, leg swelling and palpitations.  Gastrointestinal: Negative.  Negative for abdominal distention, abdominal pain, blood in stool, constipation, diarrhea, nausea, rectal pain and vomiting.  Endocrine: Negative for  hot flashes.  Genitourinary:  Negative for bladder incontinence, difficulty urinating, dyspareunia, dysuria, frequency, hematuria, menstrual problem, nocturia, pelvic pain, vaginal bleeding and vaginal discharge.   Musculoskeletal: Negative.  Negative for arthralgias, back pain, flank pain, gait problem, myalgias, neck pain and neck stiffness.  Skin: Negative.  Negative for itching, rash and wound.  Neurological:  Positive for numbness (bilateral fingers, toes, feet and legs). Negative for dizziness, extremity weakness, gait problem, headaches, light-headedness, seizures and speech difficulty.  Hematological: Negative.  Negative for adenopathy. Does not bruise/bleed easily.   Psychiatric/Behavioral: Negative.  Negative for confusion, decreased concentration, depression, sleep disturbance and suicidal ideas. The patient is not nervous/anxious.     VITALS:  Blood pressure (!) 140/91, pulse 70, temperature 97.6 F (36.4 C), temperature source Oral, resp. rate 18, height 5\' 4"  (1.626 m), weight 132 lb 11.2 oz (60.2 kg), SpO2 97%.  Wt Readings from Last 3 Encounters:  05/23/23 132 lb 11.2 oz (60.2 kg)  02/22/23 133 lb 11.2 oz (60.6 kg)  10/19/22 130 lb 4.8 oz (59.1 kg)    Body mass index is 22.78 kg/m.  Performance status (ECOG): 1 - Symptomatic but completely ambulatory  PHYSICAL EXAM:  Physical Exam Vitals and nursing note reviewed.  Constitutional:      General: She is not in acute distress.    Appearance: Normal appearance. She is normal weight. She is not ill-appearing, toxic-appearing or diaphoretic.  HENT:     Head: Normocephalic and atraumatic.     Right Ear: Tympanic membrane, ear canal and external ear normal. There is no impacted cerumen.     Left Ear: Tympanic membrane, ear canal and external ear normal. There is no impacted cerumen.     Nose: Nose normal. No congestion or rhinorrhea.     Mouth/Throat:     Mouth: Mucous membranes are moist.     Pharynx: Oropharynx is clear. No oropharyngeal exudate or posterior oropharyngeal erythema.  Eyes:     General: No scleral icterus.    Extraocular Movements: Extraocular movements intact.     Conjunctiva/sclera: Conjunctivae normal.     Pupils: Pupils are equal, round, and reactive to light.  Cardiovascular:     Rate and Rhythm: Normal rate and regular rhythm.     Heart sounds: Murmur heard.     Systolic murmur is present with a grade of 2/6.     No friction rub. No gallop.  Pulmonary:     Effort: Pulmonary effort is normal. No respiratory distress.     Breath sounds: Normal breath sounds. No stridor. No wheezing, rhonchi or rales.  Chest:     Chest wall: No tenderness.  Breasts:    Right:  Normal. No swelling, bleeding, inverted nipple, mass, nipple discharge, skin change or tenderness.     Left: Normal. No swelling, bleeding, inverted nipple, mass, nipple discharge, skin change or tenderness.     Comments: Mild fibrocystic changes of the bottom half of the left breast Well healed scar in the upper inner quadrant of the left breast with some nodularity Left axillary is negative Faded incision in the superior right areolar complex  No masses in either breast Abdominal:     General: Bowel sounds are normal. There is no distension.     Palpations: Abdomen is soft. There is no mass.     Tenderness: There is no abdominal tenderness.  Musculoskeletal:        General: Normal range of motion.     Cervical back: Normal range of motion and neck  supple. No tenderness.     Right lower leg: No edema.     Left lower leg: No edema.  Lymphadenopathy:     Cervical: No cervical adenopathy.  Skin:    General: Skin is warm and dry.     Coloration: Skin is not jaundiced.     Findings: Petechiae present. No rash.     Comments: Hyperpigmentation and small petechiae of bilateral lower extremities Skin is dry with no swelling  Neurological:     Mental Status: She is alert and oriented to person, place, and time.     Cranial Nerves: No cranial nerve deficit.  Psychiatric:        Mood and Affect: Mood normal.        Behavior: Behavior normal.        Thought Content: Thought content normal.    LABS:      Latest Ref Rng & Units 05/23/2023    1:04 PM 02/22/2023    2:56 PM 10/19/2022   10:50 AM  CBC  WBC 4.0 - 10.5 K/uL 5.4  4.3  4.0   Hemoglobin 12.0 - 15.0 g/dL 40.9  81.1  91.4   Hematocrit 36.0 - 46.0 % 41.4  38.0  41.7   Platelets 150 - 400 K/uL 137  128  124       Latest Ref Rng & Units 05/23/2023    1:04 PM 02/22/2023    2:56 PM 10/19/2022   10:50 AM  CMP  Glucose 70 - 99 mg/dL 88  782  97   BUN 8 - 23 mg/dL 25  22  23    Creatinine 0.44 - 1.00 mg/dL 9.56  2.13  0.86   Sodium  135 - 145 mmol/L 141  140  141   Potassium 3.5 - 5.1 mmol/L 3.9  3.6  3.8   Chloride 98 - 111 mmol/L 103  102  102   CO2 22 - 32 mmol/L 29  29  28    Calcium 8.9 - 10.3 mg/dL 57.8  46.9  9.7   Total Protein 6.5 - 8.1 g/dL 7.2  7.2  6.9   Total Bilirubin 0.0 - 1.2 mg/dL 0.6  0.6  1.1   Alkaline Phos 38 - 126 U/L 118  108  71   AST 15 - 41 U/L 34  41  33   ALT 0 - 44 U/L 18  24  22     STUDIES:  No results found.    HISTORY:   Past Medical History:  Diagnosis Date   Family history of breast cancer 02/17/2022   H/O mitral valve repair    HTN (hypertension)    Status post left breast lumpectomy     Past Surgical History:  Procedure Laterality Date   TONSILLECTOMY      Family History  Problem Relation Age of Onset   Cancer Mother    Cancer Maternal Aunt     Social History:  reports that she has never smoked. She has never used smokeless tobacco. She reports that she does not drink alcohol and does not use drugs.The patient is alone today.  Allergies: No Known Allergies  Current Medications: Current Outpatient Medications  Medication Sig Dispense Refill   amLODipine (NORVASC) 2.5 MG tablet Take 1 tablet by mouth daily.     diltiazem (CARDIZEM CD) 120 MG 24 hr capsule Take 120 mg by mouth daily.     docusate sodium (COLACE) 100 MG capsule Take 100 mg by mouth 2 (two) times daily.  fluticasone (FLONASE) 50 MCG/ACT nasal spray Place into both nostrils.     glucosamine-chondroitin 500-400 MG tablet Take 1 tablet by mouth daily.     levocetirizine (XYZAL) 5 MG tablet Take 5 mg by mouth daily.     losartan (COZAAR) 100 MG tablet Take 100 mg by mouth daily.     multivitamin-lutein (OCUVITE-LUTEIN) CAPS capsule Take 1 capsule by mouth daily.     simvastatin (ZOCOR) 20 MG tablet Take 20 mg by mouth at bedtime.     amoxicillin (AMOXIL) 500 MG capsule Prn prior to dentist visit with hx of mitral valve prolapse (Patient not taking: Reported on 02/22/2023)     anastrozole  (ARIMIDEX) 1 MG tablet TAKE 1 TABLET EVERY DAY 90 tablet 3   Calcium Citrate-Vitamin D 315-5 MG-MCG TABS Take 1 tablet by mouth daily.     Cholecalciferol 25 MCG (1000 UT) capsule Take by mouth.     Coenzyme Q10 50 MG CAPS Take by mouth.     FLUAD QUADRIVALENT 0.5 ML injection      ondansetron (ZOFRAN) 8 MG tablet Take 1 tablet (8 mg total) by mouth 2 (two) times daily as needed for refractory nausea / vomiting (Start on day 3 after chemotherapy). 30 tablet 1   prochlorperazine (COMPAZINE) 10 MG tablet Take 1 tablet (10 mg total) by mouth every 6 (six) hours as needed for nausea or vomiting. 30 tablet 1   No current facility-administered medications for this visit.    I,Jasmine M Lassiter,acting as a scribe for Dellia Beckwith, MD.,have documented all relevant documentation on the behalf of Dellia Beckwith, MD,as directed by  Dellia Beckwith, MD while in the presence of Dellia Beckwith, MD.

## 2023-05-28 ENCOUNTER — Other Ambulatory Visit: Payer: Self-pay | Admitting: Oncology

## 2023-05-28 DIAGNOSIS — C50412 Malignant neoplasm of upper-outer quadrant of left female breast: Secondary | ICD-10-CM

## 2023-06-04 ENCOUNTER — Encounter: Payer: Self-pay | Admitting: Oncology

## 2023-06-12 DIAGNOSIS — C50412 Malignant neoplasm of upper-outer quadrant of left female breast: Secondary | ICD-10-CM | POA: Diagnosis not present

## 2023-06-25 DIAGNOSIS — Z170 Estrogen receptor positive status [ER+]: Secondary | ICD-10-CM | POA: Diagnosis not present

## 2023-06-25 DIAGNOSIS — C50412 Malignant neoplasm of upper-outer quadrant of left female breast: Secondary | ICD-10-CM | POA: Diagnosis not present

## 2023-06-26 DIAGNOSIS — R0982 Postnasal drip: Secondary | ICD-10-CM | POA: Diagnosis not present

## 2023-06-26 DIAGNOSIS — Z6822 Body mass index (BMI) 22.0-22.9, adult: Secondary | ICD-10-CM | POA: Diagnosis not present

## 2023-06-26 DIAGNOSIS — I480 Paroxysmal atrial fibrillation: Secondary | ICD-10-CM | POA: Diagnosis not present

## 2023-06-26 DIAGNOSIS — R7309 Other abnormal glucose: Secondary | ICD-10-CM | POA: Diagnosis not present

## 2023-06-26 DIAGNOSIS — E559 Vitamin D deficiency, unspecified: Secondary | ICD-10-CM | POA: Diagnosis not present

## 2023-06-26 DIAGNOSIS — C50912 Malignant neoplasm of unspecified site of left female breast: Secondary | ICD-10-CM | POA: Diagnosis not present

## 2023-06-26 DIAGNOSIS — M79604 Pain in right leg: Secondary | ICD-10-CM | POA: Diagnosis not present

## 2023-06-26 DIAGNOSIS — J309 Allergic rhinitis, unspecified: Secondary | ICD-10-CM | POA: Diagnosis not present

## 2023-06-26 DIAGNOSIS — R52 Pain, unspecified: Secondary | ICD-10-CM | POA: Diagnosis not present

## 2023-07-14 DIAGNOSIS — I480 Paroxysmal atrial fibrillation: Secondary | ICD-10-CM | POA: Diagnosis not present

## 2023-07-16 DIAGNOSIS — I491 Atrial premature depolarization: Secondary | ICD-10-CM | POA: Diagnosis not present

## 2023-07-16 DIAGNOSIS — H353121 Nonexudative age-related macular degeneration, left eye, early dry stage: Secondary | ICD-10-CM | POA: Diagnosis not present

## 2023-07-16 DIAGNOSIS — I480 Paroxysmal atrial fibrillation: Secondary | ICD-10-CM | POA: Diagnosis not present

## 2023-07-16 DIAGNOSIS — I493 Ventricular premature depolarization: Secondary | ICD-10-CM | POA: Diagnosis not present

## 2023-07-16 DIAGNOSIS — I4719 Other supraventricular tachycardia: Secondary | ICD-10-CM | POA: Diagnosis not present

## 2023-08-01 DIAGNOSIS — Z170 Estrogen receptor positive status [ER+]: Secondary | ICD-10-CM | POA: Diagnosis not present

## 2023-08-01 DIAGNOSIS — N2889 Other specified disorders of kidney and ureter: Secondary | ICD-10-CM | POA: Diagnosis not present

## 2023-08-01 DIAGNOSIS — N289 Disorder of kidney and ureter, unspecified: Secondary | ICD-10-CM | POA: Diagnosis not present

## 2023-08-01 DIAGNOSIS — I10 Essential (primary) hypertension: Secondary | ICD-10-CM | POA: Diagnosis not present

## 2023-08-01 DIAGNOSIS — C50412 Malignant neoplasm of upper-outer quadrant of left female breast: Secondary | ICD-10-CM | POA: Diagnosis not present

## 2023-08-01 DIAGNOSIS — Z954 Presence of other heart-valve replacement: Secondary | ICD-10-CM | POA: Diagnosis not present

## 2023-08-01 DIAGNOSIS — Z9889 Other specified postprocedural states: Secondary | ICD-10-CM | POA: Diagnosis not present

## 2023-08-01 DIAGNOSIS — M79662 Pain in left lower leg: Secondary | ICD-10-CM | POA: Diagnosis not present

## 2023-08-01 DIAGNOSIS — M79661 Pain in right lower leg: Secondary | ICD-10-CM | POA: Diagnosis not present

## 2023-08-01 DIAGNOSIS — R002 Palpitations: Secondary | ICD-10-CM | POA: Diagnosis not present

## 2023-08-01 DIAGNOSIS — R911 Solitary pulmonary nodule: Secondary | ICD-10-CM | POA: Diagnosis not present

## 2023-08-01 DIAGNOSIS — I4892 Unspecified atrial flutter: Secondary | ICD-10-CM | POA: Diagnosis not present

## 2023-08-01 DIAGNOSIS — R000 Tachycardia, unspecified: Secondary | ICD-10-CM | POA: Diagnosis not present

## 2023-08-01 DIAGNOSIS — I083 Combined rheumatic disorders of mitral, aortic and tricuspid valves: Secondary | ICD-10-CM | POA: Diagnosis not present

## 2023-08-01 DIAGNOSIS — I341 Nonrheumatic mitral (valve) prolapse: Secondary | ICD-10-CM | POA: Diagnosis not present

## 2023-08-01 DIAGNOSIS — Z79899 Other long term (current) drug therapy: Secondary | ICD-10-CM | POA: Diagnosis not present

## 2023-08-01 DIAGNOSIS — I340 Nonrheumatic mitral (valve) insufficiency: Secondary | ICD-10-CM | POA: Diagnosis not present

## 2023-08-01 DIAGNOSIS — E782 Mixed hyperlipidemia: Secondary | ICD-10-CM | POA: Diagnosis not present

## 2023-08-01 DIAGNOSIS — Z853 Personal history of malignant neoplasm of breast: Secondary | ICD-10-CM | POA: Diagnosis not present

## 2023-08-01 DIAGNOSIS — R7989 Other specified abnormal findings of blood chemistry: Secondary | ICD-10-CM | POA: Diagnosis not present

## 2023-08-01 DIAGNOSIS — R0609 Other forms of dyspnea: Secondary | ICD-10-CM | POA: Diagnosis not present

## 2023-08-02 ENCOUNTER — Telehealth: Payer: Self-pay

## 2023-08-02 DIAGNOSIS — I4892 Unspecified atrial flutter: Secondary | ICD-10-CM | POA: Diagnosis not present

## 2023-08-02 DIAGNOSIS — Z853 Personal history of malignant neoplasm of breast: Secondary | ICD-10-CM | POA: Diagnosis not present

## 2023-08-02 DIAGNOSIS — I10 Essential (primary) hypertension: Secondary | ICD-10-CM | POA: Diagnosis not present

## 2023-08-02 NOTE — Telephone Encounter (Signed)
 Pt called from [facility]. She was sent to ER from Dr Harwood Lingo (cardiology)office for c/o of heart racing. They admitted her for OBS. Heart rate is now controlled and she expects to be discharged today once last troponin comes back. While doing scans/xrays they found a new spot on her kidney and left lung. She wants to be seen sooner than scheduled July appt. Please advise.

## 2023-08-03 ENCOUNTER — Other Ambulatory Visit: Payer: Self-pay | Admitting: Oncology

## 2023-08-03 DIAGNOSIS — C50412 Malignant neoplasm of upper-outer quadrant of left female breast: Secondary | ICD-10-CM

## 2023-08-06 ENCOUNTER — Telehealth: Payer: Self-pay | Admitting: Oncology

## 2023-08-06 NOTE — Telephone Encounter (Signed)
 Patient has been scheduled for follow-up visit per 08/03/23 LOS.  LVM notifying pt of appt details, provided my direct number to pt if appt changes need to be made.

## 2023-08-07 NOTE — Progress Notes (Addendum)
 ADDENDUM: After reviewing the renal ultrasound done in the hospital, I will order a contrast-enhanced renal protocol CT scan as recommended by the radiologist to evaluate the 2 lesions of the left kidney.  I had also ordered cancer markers of CA 27.29 and CEA and these are both just slightly elevated so I will repeat them when she returns in 3 months.    [facility]  [address] [place],  [place]  [address] [phone]  Clinic Day: 08/09/23  Referring physician: Clemmie Nest, MD  ASSESSMENT & PLAN:  Assessment: Breast cancer of upper-outer quadrant of left female breast (HCC) Stage IIB (T2 N0 M0) grade 3, infiltrating ductal carcinoma of the left breast diagnosed in May, 2023. She was treated with lumpectomy. Pathology revealed a 28 mm with 2 negative nodes in the background of high-grade ductal carcinoma in situ.  Margins were clear.  Estrogen receptors positive at 30% and progesterone receptors are negative and HER2 negative.  Ki-67 was 60%.    She was felt to be at higher risk for recurrence, so we discussed chemotherapy such as TC for 4 cycles.  However, we would have to take into consideration her advanced age of 32.  Her EndoPredict score then came back at 5.2, correlating with a 48% risk of distant recurrence in the next 10 years. She would have 24% benefit with chemotherapy.  It was decided to give her adjuvant CMF chemotherapy for 8 cycles, which she completed in January, 2024. She was then placed on anastrozole  1 mg daily in February. She remains without evidence of recurrence. She knows to continue anastrozole  daily for at least a total of 5 years.    Thrombocytopenia (HCC) Mild, chronic thrombocytopenia since chemotherapy, which is stable. We will continue to monitor this.   Osteopenia after menopause Bone density scan in September 2022 revealed osteopenia of the hip with a T score of -2.0. The spine readings were normal. She continues calcium and vitamin D  daily.  She was offered alendronate, but declined. She had a bone density at [facility] last  year, on December 26, 2022. The spine was normal and her hip has osteopenia with a T score of -1.9, stable.She will continue her vitamin D.  Pulmonary nodule This was found during her recent hospital stay for atrial flutter and she had evidence of fluid as well we will plan to repeat this scan in the fall to follow-up.  Left kidney lesions The CTA scan revealed 2 hyperdense lesions of the cortical left kidney measuring 13 mm and 17 mm.  Originally I ordered an ultrasound to follow-up on this but after reviewing her discharge summary I see that a renal ultrasound was done during her hospitalization and did confirm a small left kidney mass, but the second lesion was not seen.  It was recommended that she have a nonemergent contrast-enhanced renal protocol CT to follow-up.  I will therefore schedule that and call her with the results.    Plan:   During her recent hospital stay, she was found to have a pulmonary nodule a CT angio revealed a right middle lobe subpleural density measuring 47 mm which may represent scarring but she also had an adjacent 9 mm nodular density and small right and trace left pleural effusions with faint bilateral lower lobe groundglass opacities and interlobular septal thickening favoring edema.  We recommend that she have a follow-up CT of the chest in about 6 months as she was in acute distress at  the time and now is asymptomatic.  She was also found to have 2 lesions of the left kidney measuring 13 mm and 17 mm which were hyperdense and indeterminant.  She has bilateral renal nonobstructive nephrolithiasis.  She later had an ultrasound of the kidneys and one of the left kidney masses was visualized but the other was not seen.  It was recommended that she have a nonemergent contrast-enhanced renal protocol CT to follow-up on this so I will get that scheduled.  Originally I  ordered a renal ultrasound until I realized that this had already been done as an inpatient, so I will get the CT as recommended instead.  She tells me she had COVID but not pneumonia and in April had a severe cough.  Her cardiologist, Dr. Epifanio, will be following up next week regarding her heart rhythms.  She does complain of occasional hot flashes and some cramping of her legs but is otherwise doing well.  Her labs reveal a persistent mild thrombocytopenia with a platelet count of 134,000 but normal white count and normal platelet count.  Comprehensive metabolic profile reveals a mildly elevated creatinine of 1.07 with a BUN of 22, a nonfasting glucose of 106 and a mildly elevated calcium of 10.5.  The rest is normal.  I have recommended that she decrease her calcium supplement to once daily from twice daily.  She continues to take her anastrozole  1 mg daily without difficulty.  I will schedule her for a renal protocol CT scan to follow-up on the 2 lesions of the left kidney.  I will cancel her upcoming appointment on July 17 and instead see her back in 3 to 4 months with CBC, comprehensive metabolic profile and CT chest.  The patient understands the plans discussed today and is in agreement with them.  She knows to contact our office if she develops concerns prior to her next appointment.  I provided 32 minutes of face-to-face time during this encounter and > 50% was spent counseling as documented under my assessment and plan.   Ashley VEAR Cornish, MD [facility] - A DEPT OF [facility] [address] [place] [address] Dept: [phone] Dept Fax: [phone]   No orders of the defined types were placed in this encounter.  CHIEF COMPLAINT:  CC: Stage IIB hormone receptor positive breast cancer  Current Treatment:  Anastrozole  1 mg daily  HISTORY OF PRESENT ILLNESS:   Oncology History  Breast cancer of upper-outer quadrant of  left female breast (HCC)  06/01/2021 Initial Diagnosis   Breast cancer of upper-outer quadrant of left female breast (HCC)   07/13/2021 Cancer Staging   Staging form: Breast, AJCC 8th Edition - Clinical stage from 07/13/2021: Stage IIB (cT2, cN0(sn), cM0, G3, ER+, PR-, HER2-) - Signed by Villa Ashley VEAR, MD on 08/10/2021 Histopathologic type: Infiltrating duct carcinoma, NOS Stage prefix: Initial diagnosis Method of lymph node assessment: Sentinel lymph node biopsy Nuclear grade: G3 Histologic grading system: 3 grade system Laterality: Left Tumor size (mm): 28 Lymph-vascular invasion (LVI): LVI not present (absent)/not identified Diagnostic confirmation: Positive histology Specimen type: Excision Staged by: Managing physician Percentage of positive estrogen receptors (%): 30 Percentage of positive progesterone receptors (%): 0 HER2-IHC interpretation: Negative HER2-IHC value: Score 1+ Menopausal status: Postmenopausal Ki-67 (%): 60 Stage used in treatment planning: Yes National guidelines used in treatment planning: Yes Type of national guideline used in treatment planning: NCCN   09/29/2021 - 10/27/2021 Chemotherapy  Patient is on Treatment Plan : BREAST Adjuvant CMF IV q21d     09/29/2021 - 03/16/2022 Chemotherapy   Patient is on Treatment Plan : BREAST Adjuvant CMF IV q21d       INTERVAL HISTORY:  Ashley Villa is here today for repeat clinical assessment for her stage IIB hormone receptor positive breast cancer. Patient states that she feels fair and was recently hospitalized for atrial flutter with rapid ventricular response. During her hospital stay, she was found to have a pulmonary nodule a CT angio revealed a right middle lobe subpleural density measuring 47 mm which may represent scarring but she also had an adjacent 9 mm nodular density and small right and trace left pleural effusions with faint bilateral lower lobe groundglass opacities and interlobular septal thickening  favoring edema.  We recommend that she have a follow-up CT of the chest in about 6 months as she was in acute distress at the time and now is asymptomatic.  She was also found to have 2 lesions of the left kidney measuring 13 mm and 17 mm which were hyperdense and indeterminant.  She has bilateral renal nonobstructive nephrolithiasis.  She later had an ultrasound of the kidneys and one of the left kidney masses was visualized but the other was not seen.  It was recommended that she have a nonemergent contrast-enhanced renal protocol CT to follow-up on this so I will get that scheduled.  Originally I ordered a renal ultrasound until I realized that this had already been done as an inpatient, so I will get the CT as recommended instead.  She tells me she had COVID but not pneumonia and in April had a severe cough.  Her cardiologist, Dr. Epifanio, will be following up next week regarding her heart rhythms.  She does complain of occasional hot flashes and some cramping of her legs but is otherwise doing well.  Her labs reveal a persistent mild thrombocytopenia with a platelet count of 134,000 but normal white count and normal platelet count.  Comprehensive metabolic profile reveals a mildly elevated creatinine of 1.07 with a BUN of 22, a nonfasting glucose of 106 and a mildly elevated calcium of 10.5.  The rest is normal.  I have recommended that she decrease her calcium supplement to once daily from twice daily.  I will schedule her for a renal protocol CT scan to follow-up on the 2 lesions of the left kidney.  I will cancel her upcoming appointment on July 17 and instead see her back in 3 to 4 months with CBC, comprehensive metabolic profile and CT chest. She denies fever, chills, night sweats, or other signs of infection. She denies cardiorespiratory and gastrointestinal issues. She  denies pain. Her appetite is stable and Her weight has increased to pounds over last 2 months.  REVIEW OF SYSTEMS:  Review of  Systems  Constitutional:  Negative for appetite change, chills, diaphoresis, fatigue, fever and unexpected weight change.  HENT:  Negative.  Negative for hearing loss, lump/mass, mouth sores, nosebleeds, sore throat, tinnitus, trouble swallowing and voice change.   Eyes:  Negative for eye problems and icterus.  Respiratory:  Positive for shortness of breath (with exertion). Negative for chest tightness, cough, hemoptysis and wheezing.   Cardiovascular:  Negative for chest pain, leg swelling and palpitations.  Gastrointestinal: Negative.  Negative for abdominal distention, abdominal pain, blood in stool, constipation, diarrhea, nausea, rectal pain and vomiting.  Endocrine: Negative for hot flashes.  Genitourinary:  Negative for bladder incontinence, difficulty urinating, dyspareunia, dysuria,  frequency, hematuria, menstrual problem, nocturia, pelvic pain, vaginal bleeding and vaginal discharge.   Musculoskeletal: Negative.  Negative for arthralgias, back pain, flank pain, gait problem, myalgias, neck pain and neck stiffness.  Skin: Negative.  Negative for itching, rash and wound.  Neurological:  Positive for numbness (bilateral fingers, toes, feet and legs). Negative for dizziness, extremity weakness, gait problem, headaches, light-headedness, seizures and speech difficulty.  Hematological: Negative.  Negative for adenopathy. Does not bruise/bleed easily.  Psychiatric/Behavioral: Negative.  Negative for confusion, decreased concentration, depression, sleep disturbance and suicidal ideas. The patient is not nervous/anxious.     VITALS:  Blood pressure 137/87, pulse 71, temperature 97.9 F (36.6 C), temperature source Oral, resp. rate 18, height 5' 4 (1.626 m), weight 134 lb (60.8 kg), SpO2 98%.  Wt Readings from Last 3 Encounters:  08/09/23 134 lb (60.8 kg)  05/23/23 132 lb 11.2 oz (60.2 kg)  02/22/23 133 lb 11.2 oz (60.6 kg)    Body mass index is 23 kg/m.  Performance status (ECOG): 1 -  Symptomatic but completely ambulatory  PHYSICAL EXAM:  Physical Exam Vitals and nursing note reviewed.  Constitutional:      General: She is not in acute distress.    Appearance: Normal appearance. She is normal weight. She is not ill-appearing, toxic-appearing or diaphoretic.  HENT:     Head: Normocephalic and atraumatic.     Right Ear: Tympanic membrane, ear canal and external ear normal. There is no impacted cerumen.     Left Ear: Tympanic membrane, ear canal and external ear normal. There is no impacted cerumen.     Nose: Nose normal. No congestion or rhinorrhea.     Mouth/Throat:     Mouth: Mucous membranes are moist.     Pharynx: Oropharynx is clear. No oropharyngeal exudate or posterior oropharyngeal erythema.   Eyes:     General: No scleral icterus.    Extraocular Movements: Extraocular movements intact.     Conjunctiva/sclera: Conjunctivae normal.     Pupils: Pupils are equal, round, and reactive to light.    Cardiovascular:     Rate and Rhythm: Normal rate and regular rhythm.     Heart sounds: Murmur heard.     Systolic murmur is present with a grade of 2/6.     No friction rub. No gallop.  Pulmonary:     Effort: Pulmonary effort is normal. No respiratory distress.     Breath sounds: Normal breath sounds. No stridor. No wheezing, rhonchi or rales.  Chest:     Chest wall: No tenderness.  Breasts:    Right: Normal. No swelling, bleeding, inverted nipple, mass, nipple discharge, skin change or tenderness.     Left: Normal. No swelling, bleeding, inverted nipple, mass, nipple discharge, skin change or tenderness.     Comments: Mild fibrocystic changes of the bottom half of the left breast Well healed scar in the upper inner quadrant of the left breast with some nodularity Left axillary is negative Faded incision in the superior right areolar complex  No masses in either breast Abdominal:     General: Bowel sounds are normal. There is no distension.     Palpations:  Abdomen is soft. There is no mass.     Tenderness: There is no abdominal tenderness.   Musculoskeletal:        General: Normal range of motion.     Cervical back: Normal range of motion and neck supple. No tenderness.     Right lower leg: No edema.  Left lower leg: No edema.  Lymphadenopathy:     Cervical: No cervical adenopathy.   Skin:    General: Skin is warm and dry.     Coloration: Skin is not jaundiced.     Findings: Petechiae present. No rash.     Comments: Hyperpigmentation and small petechiae of bilateral lower extremities Skin is dry with no swelling   Neurological:     Mental Status: She is alert and oriented to person, place, and time.     Cranial Nerves: No cranial nerve deficit.   Psychiatric:        Mood and Affect: Mood normal.        Behavior: Behavior normal.        Thought Content: Thought content normal.    LABS:      Latest Ref Rng & Units 08/09/2023    3:34 PM 05/23/2023    1:04 PM 02/22/2023    2:56 PM  CBC  WBC 4.0 - 10.5 K/uL 5.2  5.4  4.3   Hemoglobin 12.0 - 15.0 g/dL 86.2  86.2  86.8   Hematocrit 36.0 - 46.0 % 41.4  41.4  38.0   Platelets 150 - 400 K/uL 134  137  128       Latest Ref Rng & Units 08/09/2023    3:34 PM 05/23/2023    1:04 PM 02/22/2023    2:56 PM  CMP  Glucose 70 - 99 mg/dL 893  88  880   BUN 8 - 23 mg/dL 22  25  22    Creatinine 0.44 - 1.00 mg/dL 8.92  8.99  9.03   Sodium 135 - 145 mmol/L 140  141  140   Potassium 3.5 - 5.1 mmol/L 4.3  3.9  3.6   Chloride 98 - 111 mmol/L 104  103  102   CO2 22 - 32 mmol/L 24  29  29    Calcium 8.9 - 10.3 mg/dL 89.4  89.9  89.9   Total Protein 6.5 - 8.1 g/dL 7.1  7.2  7.2   Total Bilirubin 0.0 - 1.2 mg/dL 0.8  0.6  0.6   Alkaline Phos 38 - 126 U/L 108  118  108   AST 15 - 41 U/L 30  34  41   ALT 0 - 44 U/L 19  18  24     STUDIES:  No results found.    HISTORY:   Past Medical History:  Diagnosis Date   Family history of breast cancer 02/17/2022   H/O mitral valve repair    HTN  (hypertension)    Status post left breast lumpectomy     Past Surgical History:  Procedure Laterality Date   TONSILLECTOMY      Family History  Problem Relation Age of Onset   Cancer Mother    Cancer Maternal Aunt     Social History:  reports that she has never smoked. She has never used smokeless tobacco. She reports that she does not drink alcohol and does not use drugs.The patient is alone today.  Allergies: No Known Allergies  Current Medications: Current Outpatient Medications  Medication Sig Dispense Refill   amoxicillin (AMOXIL) 500 MG capsule Prn prior to dentist visit with hx of mitral valve prolapse (Patient not taking: Reported on 02/22/2023)     anastrozole  (ARIMIDEX ) 1 MG tablet TAKE 1 TABLET EVERY DAY 90 tablet 3   Calcium Citrate-Vitamin D 315-5 MG-MCG TABS Take 1 tablet by mouth daily.     Cholecalciferol 25 MCG (1000  UT) capsule Take by mouth.     Coenzyme Q10 50 MG CAPS Take by mouth.     diltiazem (CARDIZEM CD) 180 MG 24 hr capsule Take 180 mg by mouth daily.     docusate sodium (COLACE) 100 MG capsule Take 100 mg by mouth 2 (two) times daily.     FLUAD QUADRIVALENT 0.5 ML injection      fluticasone (FLONASE) 50 MCG/ACT nasal spray Place into both nostrils.     glucosamine-chondroitin 500-400 MG tablet Take 1 tablet by mouth daily.     levocetirizine (XYZAL) 5 MG tablet Take 5 mg by mouth daily.     losartan (COZAAR) 100 MG tablet Take 100 mg by mouth daily.     multivitamin-lutein (OCUVITE-LUTEIN) CAPS capsule Take 1 capsule by mouth daily.     ondansetron  (ZOFRAN ) 8 MG tablet Take 1 tablet (8 mg total) by mouth 2 (two) times daily as needed for refractory nausea / vomiting (Start on day 3 after chemotherapy). 30 tablet 1   prochlorperazine  (COMPAZINE ) 10 MG tablet Take 1 tablet (10 mg total) by mouth every 6 (six) hours as needed for nausea or vomiting. 30 tablet 1   simvastatin (ZOCOR) 20 MG tablet Take 20 mg by mouth at bedtime.     No current  facility-administered medications for this visit.    I,Jasmine M Lassiter,acting as a scribe for Ashley VEAR Cornish, MD.,have documented all relevant documentation on the behalf of Ashley VEAR Cornish, MD,as directed by  Ashley VEAR Cornish, MD while in the presence of Ashley VEAR Cornish, MD.

## 2023-08-09 ENCOUNTER — Other Ambulatory Visit: Payer: Self-pay | Admitting: Oncology

## 2023-08-09 ENCOUNTER — Inpatient Hospital Stay: Attending: Oncology | Admitting: Oncology

## 2023-08-09 ENCOUNTER — Encounter: Payer: Self-pay | Admitting: Oncology

## 2023-08-09 ENCOUNTER — Inpatient Hospital Stay

## 2023-08-09 VITALS — BP 137/87 | HR 71 | Temp 97.9°F | Resp 18 | Ht 64.0 in | Wt 134.0 lb

## 2023-08-09 DIAGNOSIS — N289 Disorder of kidney and ureter, unspecified: Secondary | ICD-10-CM | POA: Diagnosis not present

## 2023-08-09 DIAGNOSIS — D696 Thrombocytopenia, unspecified: Secondary | ICD-10-CM | POA: Insufficient documentation

## 2023-08-09 DIAGNOSIS — C50412 Malignant neoplasm of upper-outer quadrant of left female breast: Secondary | ICD-10-CM | POA: Insufficient documentation

## 2023-08-09 DIAGNOSIS — R93422 Abnormal radiologic findings on diagnostic imaging of left kidney: Secondary | ICD-10-CM | POA: Insufficient documentation

## 2023-08-09 DIAGNOSIS — Z1732 Human epidermal growth factor receptor 2 negative status: Secondary | ICD-10-CM | POA: Insufficient documentation

## 2023-08-09 DIAGNOSIS — Z1722 Progesterone receptor negative status: Secondary | ICD-10-CM | POA: Insufficient documentation

## 2023-08-09 DIAGNOSIS — M8580 Other specified disorders of bone density and structure, unspecified site: Secondary | ICD-10-CM | POA: Diagnosis not present

## 2023-08-09 DIAGNOSIS — Z170 Estrogen receptor positive status [ER+]: Secondary | ICD-10-CM | POA: Diagnosis not present

## 2023-08-09 DIAGNOSIS — Z780 Asymptomatic menopausal state: Secondary | ICD-10-CM | POA: Diagnosis not present

## 2023-08-09 DIAGNOSIS — R911 Solitary pulmonary nodule: Secondary | ICD-10-CM | POA: Insufficient documentation

## 2023-08-09 DIAGNOSIS — R252 Cramp and spasm: Secondary | ICD-10-CM

## 2023-08-09 DIAGNOSIS — M8589 Other specified disorders of bone density and structure, multiple sites: Secondary | ICD-10-CM | POA: Insufficient documentation

## 2023-08-09 DIAGNOSIS — Z79811 Long term (current) use of aromatase inhibitors: Secondary | ICD-10-CM | POA: Diagnosis not present

## 2023-08-09 DIAGNOSIS — R918 Other nonspecific abnormal finding of lung field: Secondary | ICD-10-CM

## 2023-08-09 DIAGNOSIS — R7989 Other specified abnormal findings of blood chemistry: Secondary | ICD-10-CM | POA: Insufficient documentation

## 2023-08-09 LAB — CBC WITH DIFFERENTIAL (CANCER CENTER ONLY)
Abs Immature Granulocytes: 0.02 10*3/uL (ref 0.00–0.07)
Basophils Absolute: 0 10*3/uL (ref 0.0–0.1)
Basophils Relative: 1 %
Eosinophils Absolute: 0.1 10*3/uL (ref 0.0–0.5)
Eosinophils Relative: 1 %
HCT: 41.4 % (ref 36.0–46.0)
Hemoglobin: 13.7 g/dL (ref 12.0–15.0)
Immature Granulocytes: 0 %
Immature Platelet Fraction: 2.2 % (ref 1.2–8.6)
Lymphocytes Relative: 22 %
Lymphs Abs: 1.1 10*3/uL (ref 0.7–4.0)
MCH: 30.4 pg (ref 26.0–34.0)
MCHC: 33.1 g/dL (ref 30.0–36.0)
MCV: 91.8 fL (ref 80.0–100.0)
Monocytes Absolute: 0.6 10*3/uL (ref 0.1–1.0)
Monocytes Relative: 12 %
Neutro Abs: 3.3 10*3/uL (ref 1.7–7.7)
Neutrophils Relative %: 64 %
Platelet Count: 134 10*3/uL — ABNORMAL LOW (ref 150–400)
RBC: 4.51 MIL/uL (ref 3.87–5.11)
RDW: 13.9 % (ref 11.5–15.5)
WBC Count: 5.2 10*3/uL (ref 4.0–10.5)
nRBC: 0 % (ref 0.0–0.2)

## 2023-08-09 LAB — CMP (CANCER CENTER ONLY)
ALT: 19 U/L (ref 0–44)
AST: 30 U/L (ref 15–41)
Albumin: 4.3 g/dL (ref 3.5–5.0)
Alkaline Phosphatase: 108 U/L (ref 38–126)
Anion gap: 12 (ref 5–15)
BUN: 22 mg/dL (ref 8–23)
CO2: 24 mmol/L (ref 22–32)
Calcium: 10.5 mg/dL — ABNORMAL HIGH (ref 8.9–10.3)
Chloride: 104 mmol/L (ref 98–111)
Creatinine: 1.07 mg/dL — ABNORMAL HIGH (ref 0.44–1.00)
GFR, Estimated: 52 mL/min — ABNORMAL LOW (ref >60)
Glucose, Bld: 106 mg/dL — ABNORMAL HIGH (ref 70–99)
Potassium: 4.3 mmol/L (ref 3.5–5.1)
Sodium: 140 mmol/L (ref 135–145)
Total Bilirubin: 0.8 mg/dL (ref 0.0–1.2)
Total Protein: 7.1 g/dL (ref 6.5–8.1)

## 2023-08-09 LAB — CEA (ACCESS): CEA (CHCC): 5.47 ng/mL — ABNORMAL HIGH (ref 0.00–5.00)

## 2023-08-10 ENCOUNTER — Telehealth: Payer: Self-pay | Admitting: Oncology

## 2023-08-10 ENCOUNTER — Other Ambulatory Visit: Payer: Self-pay

## 2023-08-10 DIAGNOSIS — R252 Cramp and spasm: Secondary | ICD-10-CM

## 2023-08-10 LAB — MAGNESIUM: Magnesium: 2 mg/dL (ref 1.7–2.4)

## 2023-08-10 LAB — CANCER ANTIGEN 27.29: CA 27.29: 42 U/mL — ABNORMAL HIGH (ref 0.0–38.6)

## 2023-08-10 NOTE — Telephone Encounter (Signed)
 Patient has been scheduled for follow-up visit per 08/09/23 LOS.  Pt aware of scheduled appt details.

## 2023-08-15 DIAGNOSIS — I480 Paroxysmal atrial fibrillation: Secondary | ICD-10-CM | POA: Diagnosis not present

## 2023-08-15 DIAGNOSIS — R002 Palpitations: Secondary | ICD-10-CM | POA: Diagnosis not present

## 2023-08-15 DIAGNOSIS — I340 Nonrheumatic mitral (valve) insufficiency: Secondary | ICD-10-CM | POA: Diagnosis not present

## 2023-08-15 DIAGNOSIS — I341 Nonrheumatic mitral (valve) prolapse: Secondary | ICD-10-CM | POA: Diagnosis not present

## 2023-08-15 DIAGNOSIS — I4892 Unspecified atrial flutter: Secondary | ICD-10-CM | POA: Diagnosis not present

## 2023-08-16 ENCOUNTER — Encounter (HOSPITAL_BASED_OUTPATIENT_CLINIC_OR_DEPARTMENT_OTHER): Payer: Self-pay

## 2023-08-16 ENCOUNTER — Ambulatory Visit (HOSPITAL_BASED_OUTPATIENT_CLINIC_OR_DEPARTMENT_OTHER)
Admission: RE | Admit: 2023-08-16 | Discharge: 2023-08-16 | Disposition: A | Discharge status: Home / Self Care | Source: Ambulatory Visit | Attending: Oncology | Admitting: Oncology

## 2023-08-16 DIAGNOSIS — I4892 Unspecified atrial flutter: Secondary | ICD-10-CM | POA: Diagnosis not present

## 2023-08-16 DIAGNOSIS — I499 Cardiac arrhythmia, unspecified: Secondary | ICD-10-CM | POA: Diagnosis not present

## 2023-08-16 DIAGNOSIS — N289 Disorder of kidney and ureter, unspecified: Secondary | ICD-10-CM

## 2023-08-28 ENCOUNTER — Other Ambulatory Visit: Payer: Self-pay | Admitting: Oncology

## 2023-08-28 ENCOUNTER — Encounter: Payer: Self-pay | Admitting: Oncology

## 2023-08-28 DIAGNOSIS — C50412 Malignant neoplasm of upper-outer quadrant of left female breast: Secondary | ICD-10-CM

## 2023-08-28 DIAGNOSIS — N2889 Other specified disorders of kidney and ureter: Secondary | ICD-10-CM

## 2023-08-29 ENCOUNTER — Telehealth: Payer: Self-pay

## 2023-08-29 NOTE — Telephone Encounter (Signed)
-----   Message from Wanda VEAR Cornish sent at 08/28/2023  6:06 PM EDT ----- Regarding: call Tell her I have reviewed the prior CT as well as the kidney ultrasound from the hospital and I would recommend a CT of the kidney to further evaluate the need to lesions of the left kidney.  If she is agreeable, please schedule

## 2023-08-29 NOTE — Telephone Encounter (Signed)
 Attempted to contact patient. No answer.

## 2023-09-10 ENCOUNTER — Telehealth (HOSPITAL_BASED_OUTPATIENT_CLINIC_OR_DEPARTMENT_OTHER): Payer: Self-pay

## 2023-09-13 ENCOUNTER — Ambulatory Visit (HOSPITAL_BASED_OUTPATIENT_CLINIC_OR_DEPARTMENT_OTHER)
Admission: RE | Admit: 2023-09-13 | Discharge: 2023-09-13 | Disposition: A | Discharge status: Home / Self Care | Source: Ambulatory Visit | Attending: Oncology | Admitting: Oncology

## 2023-09-13 DIAGNOSIS — N2889 Other specified disorders of kidney and ureter: Secondary | ICD-10-CM | POA: Diagnosis not present

## 2023-09-13 DIAGNOSIS — N200 Calculus of kidney: Secondary | ICD-10-CM | POA: Diagnosis not present

## 2023-09-13 DIAGNOSIS — K449 Diaphragmatic hernia without obstruction or gangrene: Secondary | ICD-10-CM | POA: Diagnosis not present

## 2023-09-13 MED ORDER — IOHEXOL 300 MG/ML  SOLN
100.0000 mL | Freq: Once | INTRAMUSCULAR | Status: AC | PRN
  Administered 2023-09-13: 100 mL via INTRAVENOUS

## 2023-09-17 ENCOUNTER — Encounter: Payer: Self-pay | Admitting: Oncology

## 2023-09-17 NOTE — Telephone Encounter (Signed)
-----   Message from Wanda VEAR Cornish sent at 09/16/2023  9:54 AM EDT ----- Regarding: call Tell her CT of the kidneys looks fine.  She has multiple cysts bilaterally but these appear benign and nothing suspicious for cancer.  She also has bilateral kidney stones but not obstructing anything, can leave alone.

## 2023-09-18 ENCOUNTER — Telehealth: Payer: Self-pay

## 2023-09-18 NOTE — Telephone Encounter (Signed)
-----   Message from Wanda VEAR Cornish sent at 09/16/2023  9:54 AM EDT ----- Regarding: call Tell her CT of the kidneys looks fine.  She has multiple cysts bilaterally but these appear benign and nothing suspicious for cancer.  She also has bilateral kidney stones but not obstructing anything, can leave alone.

## 2023-09-18 NOTE — Telephone Encounter (Signed)
 Attempted to contact patient.No answer on either number, Will try back later.

## 2023-09-20 ENCOUNTER — Ambulatory Visit: Admitting: Oncology

## 2023-10-02 DIAGNOSIS — I340 Nonrheumatic mitral (valve) insufficiency: Secondary | ICD-10-CM | POA: Diagnosis not present

## 2023-10-02 DIAGNOSIS — M25462 Effusion, left knee: Secondary | ICD-10-CM | POA: Diagnosis not present

## 2023-10-02 DIAGNOSIS — R1310 Dysphagia, unspecified: Secondary | ICD-10-CM | POA: Diagnosis not present

## 2023-10-02 DIAGNOSIS — C50912 Malignant neoplasm of unspecified site of left female breast: Secondary | ICD-10-CM | POA: Diagnosis not present

## 2023-10-02 DIAGNOSIS — K219 Gastro-esophageal reflux disease without esophagitis: Secondary | ICD-10-CM | POA: Diagnosis not present

## 2023-10-02 DIAGNOSIS — I480 Paroxysmal atrial fibrillation: Secondary | ICD-10-CM | POA: Diagnosis not present

## 2023-10-02 DIAGNOSIS — N1831 Chronic kidney disease, stage 3a: Secondary | ICD-10-CM | POA: Diagnosis not present

## 2023-10-02 DIAGNOSIS — K449 Diaphragmatic hernia without obstruction or gangrene: Secondary | ICD-10-CM | POA: Diagnosis not present

## 2023-10-02 DIAGNOSIS — M25512 Pain in left shoulder: Secondary | ICD-10-CM | POA: Diagnosis not present

## 2023-10-15 DIAGNOSIS — I480 Paroxysmal atrial fibrillation: Secondary | ICD-10-CM | POA: Diagnosis not present

## 2023-10-15 DIAGNOSIS — I4892 Unspecified atrial flutter: Secondary | ICD-10-CM | POA: Diagnosis not present

## 2023-10-31 DIAGNOSIS — I480 Paroxysmal atrial fibrillation: Secondary | ICD-10-CM | POA: Diagnosis not present

## 2023-10-31 DIAGNOSIS — I341 Nonrheumatic mitral (valve) prolapse: Secondary | ICD-10-CM | POA: Diagnosis not present

## 2023-10-31 DIAGNOSIS — Z9889 Other specified postprocedural states: Secondary | ICD-10-CM | POA: Diagnosis not present

## 2023-10-31 DIAGNOSIS — I340 Nonrheumatic mitral (valve) insufficiency: Secondary | ICD-10-CM | POA: Diagnosis not present

## 2023-10-31 DIAGNOSIS — I10 Essential (primary) hypertension: Secondary | ICD-10-CM | POA: Diagnosis not present

## 2023-11-07 DIAGNOSIS — G8929 Other chronic pain: Secondary | ICD-10-CM | POA: Diagnosis not present

## 2023-11-07 DIAGNOSIS — J309 Allergic rhinitis, unspecified: Secondary | ICD-10-CM | POA: Diagnosis not present

## 2023-11-07 DIAGNOSIS — R1310 Dysphagia, unspecified: Secondary | ICD-10-CM | POA: Diagnosis not present

## 2023-11-07 DIAGNOSIS — R0982 Postnasal drip: Secondary | ICD-10-CM | POA: Diagnosis not present

## 2023-11-07 DIAGNOSIS — Z6823 Body mass index (BMI) 23.0-23.9, adult: Secondary | ICD-10-CM | POA: Diagnosis not present

## 2023-11-07 DIAGNOSIS — K449 Diaphragmatic hernia without obstruction or gangrene: Secondary | ICD-10-CM | POA: Diagnosis not present

## 2023-11-07 DIAGNOSIS — K219 Gastro-esophageal reflux disease without esophagitis: Secondary | ICD-10-CM | POA: Diagnosis not present

## 2023-11-07 DIAGNOSIS — M545 Low back pain, unspecified: Secondary | ICD-10-CM | POA: Diagnosis not present

## 2023-11-22 ENCOUNTER — Ambulatory Visit (INDEPENDENT_AMBULATORY_CARE_PROVIDER_SITE_OTHER)
Admission: RE | Admit: 2023-11-22 | Discharge: 2023-11-22 | Disposition: A | Discharge status: Home / Self Care | Source: Ambulatory Visit | Attending: Oncology | Admitting: Oncology

## 2023-11-22 DIAGNOSIS — J984 Other disorders of lung: Secondary | ICD-10-CM | POA: Diagnosis not present

## 2023-11-22 DIAGNOSIS — R918 Other nonspecific abnormal finding of lung field: Secondary | ICD-10-CM | POA: Diagnosis not present

## 2023-11-22 MED ORDER — IOHEXOL 300 MG/ML  SOLN
100.0000 mL | Freq: Once | INTRAMUSCULAR | Status: AC | PRN
  Administered 2023-11-22: 75 mL via INTRAVENOUS

## 2023-11-27 ENCOUNTER — Ambulatory Visit: Admitting: Oncology

## 2023-11-27 ENCOUNTER — Other Ambulatory Visit

## 2023-11-29 ENCOUNTER — Inpatient Hospital Stay: Attending: Oncology

## 2023-11-29 ENCOUNTER — Inpatient Hospital Stay: Admitting: Oncology

## 2023-11-29 ENCOUNTER — Telehealth: Payer: Self-pay | Admitting: Oncology

## 2023-11-29 ENCOUNTER — Other Ambulatory Visit: Payer: Self-pay | Admitting: Oncology

## 2023-11-29 ENCOUNTER — Encounter: Payer: Self-pay | Admitting: Oncology

## 2023-11-29 VITALS — BP 145/86 | HR 61 | Temp 97.7°F | Resp 16 | Ht 64.0 in | Wt 133.4 lb

## 2023-11-29 DIAGNOSIS — N2889 Other specified disorders of kidney and ureter: Secondary | ICD-10-CM

## 2023-11-29 DIAGNOSIS — C50412 Malignant neoplasm of upper-outer quadrant of left female breast: Secondary | ICD-10-CM | POA: Diagnosis not present

## 2023-11-29 DIAGNOSIS — Z170 Estrogen receptor positive status [ER+]: Secondary | ICD-10-CM | POA: Insufficient documentation

## 2023-11-29 DIAGNOSIS — N289 Disorder of kidney and ureter, unspecified: Secondary | ICD-10-CM

## 2023-11-29 DIAGNOSIS — R911 Solitary pulmonary nodule: Secondary | ICD-10-CM

## 2023-11-29 DIAGNOSIS — R970 Elevated carcinoembryonic antigen [CEA]: Secondary | ICD-10-CM

## 2023-11-29 LAB — CMP (CANCER CENTER ONLY)
ALT: 24 U/L (ref 0–44)
AST: 36 U/L (ref 15–41)
Albumin: 4.1 g/dL (ref 3.5–5.0)
Alkaline Phosphatase: 125 U/L (ref 38–126)
Anion gap: 10 (ref 5–15)
BUN: 25 mg/dL — ABNORMAL HIGH (ref 8–23)
CO2: 26 mmol/L (ref 22–32)
Calcium: 9.5 mg/dL (ref 8.9–10.3)
Chloride: 104 mmol/L (ref 98–111)
Creatinine: 1.21 mg/dL — ABNORMAL HIGH (ref 0.44–1.00)
GFR, Estimated: 44 mL/min — ABNORMAL LOW (ref >60)
Glucose, Bld: 90 mg/dL (ref 70–99)
Potassium: 4.2 mmol/L (ref 3.5–5.1)
Sodium: 140 mmol/L (ref 135–145)
Total Bilirubin: 0.7 mg/dL (ref 0.0–1.2)
Total Protein: 7.4 g/dL (ref 6.5–8.1)

## 2023-11-29 LAB — CBC WITH DIFFERENTIAL (CANCER CENTER ONLY)
Abs Immature Granulocytes: 0.02 K/uL (ref 0.00–0.07)
Basophils Absolute: 0 K/uL (ref 0.0–0.1)
Basophils Relative: 1 %
Eosinophils Absolute: 0.1 K/uL (ref 0.0–0.5)
Eosinophils Relative: 1 %
HCT: 41.1 % (ref 36.0–46.0)
Hemoglobin: 13.4 g/dL (ref 12.0–15.0)
Immature Granulocytes: 0 %
Lymphocytes Relative: 17 %
Lymphs Abs: 0.9 K/uL (ref 0.7–4.0)
MCH: 31.2 pg (ref 26.0–34.0)
MCHC: 32.6 g/dL (ref 30.0–36.0)
MCV: 95.6 fL (ref 80.0–100.0)
Monocytes Absolute: 0.7 K/uL (ref 0.1–1.0)
Monocytes Relative: 12 %
Neutro Abs: 3.6 K/uL (ref 1.7–7.7)
Neutrophils Relative %: 69 %
Platelet Count: 154 K/uL (ref 150–400)
RBC: 4.3 MIL/uL (ref 3.87–5.11)
RDW: 13.8 % (ref 11.5–15.5)
WBC Count: 5.3 K/uL (ref 4.0–10.5)
nRBC: 0 % (ref 0.0–0.2)

## 2023-11-29 LAB — CEA (ACCESS): CEA (CHCC): 6.19 ng/mL — ABNORMAL HIGH (ref 0.00–5.00)

## 2023-11-29 NOTE — Telephone Encounter (Signed)
 Patient has been scheduled for follow-up visit per 11/29/23 LOS.  Pt given an appt calendar with date and time.

## 2023-11-29 NOTE — Progress Notes (Addendum)
 ADDENDUM: Her CEA is now slighty elevated at 6.19 but her CA 27.29 is now down to normal from 42 previously.  I will plan to repeat both when she returns.    [facility]  [address] [place],  [place]  [address] [phone]  Clinic Day: 11/29/23  Referring physician: Clemmie Nest, MD  ASSESSMENT & PLAN:  Assessment: Breast cancer of upper-outer quadrant of left female breast (HCC) Stage IIB (T2 N0 M0) grade 3, infiltrating ductal carcinoma of the left breast diagnosed in May, 2023. She was treated with lumpectomy. Pathology revealed a 28 mm with 2 negative nodes in the background of high-grade ductal carcinoma in situ.  Margins were clear.  Estrogen receptors positive at 30% and progesterone receptors are negative and HER2 negative.  Ki-67 was 60%.    She was felt to be at higher risk for recurrence, so we discussed chemotherapy such as TC for 4 cycles.  However, we would have to take into consideration her advanced age of 52.  Her EndoPredict score then came back at 5.2, correlating with a 48% risk of distant recurrence in the next 10 years. She would have 24% benefit with chemotherapy.  It was decided to give her adjuvant CMF chemotherapy for 8 cycles, which she completed in January, 2024. She was then placed on anastrozole  1 mg daily in February of 2024 and we plan to continue that for 5 years. She remains without evidence of recurrence.   Thrombocytopenia (HCC) Mild, chronic thrombocytopenia since chemotherapy, which is normal today. We will continue to monitor this.   Osteopenia after menopause Bone density scan in September 2022 revealed osteopenia of the hip with a T score of -2.0. The spine readings were normal. She continues calcium and vitamin D daily.  She was offered alendronate, but declined. She had a bone density at [facility] last  year, on December 26, 2022. The spine was normal and her hip has osteopenia with a T score of -1.9, stable. She  will continue her vitamin D.  Pulmonary nodule This was found during her recent hospital stay for atrial flutter and she had evidence of fluid as well.  We will plan to repeat this scan in the fall to follow-up. I reviewed the images with the patient and her husband of the new CT chest which was done 11/22/2023.  This revealed redemonstration of an elongated approximately 7 x 15 mm solid noncalcified nodule in the middle lobe which was similar to the prior study, multiple additional sub 4 mm calcified and noncalcified nodules throughout bilateral lungs which are also stable since the prior study, no new or suspicious lung nodule, and no metastatic disease identified.  Left kidney lesions The CTA scan revealed 2 hyperdense lesions of the cortical left kidney measuring 13 mm and 17 mm.  Originally I ordered an ultrasound to follow-up on this but after reviewing her discharge summary I see that a renal ultrasound was done during her hospitalization and did confirm a small left kidney mass, but the second lesion was not seen. Her CT renal abdomen on 09/13/2023 revealed multiple benign hemorrhagic or proteinaceous left renal cysts with no solid renal mass identified, mild bilateral nonobstructing nephrolithiasis, as well as a large hiatal hernia.  Hiatal hernia This is fairly significant and I showed her the images. I recommend she remain on omeprazole.    Plan:   She informed me that she has mild throat irritation with hoarseness and I instructed her to remain  on omeprazole for GERD since she has a severe hiatal hernia and does feel better since she has been on that medication. Her diagnostic mammogram on 06/12/2023 was negative. Her CT renal abdomen on 09/13/2023 revealed multiple benign hemorrhagic or proteinaceous left renal cysts with no solid renal mass identified, mild bilateral nonobstructing nephrolithiasis, a large hiatal hernia, moderate cardiomegaly which was not fully assessed, and aortic  atherosclerosis. Her CT chest on 11/22/2023 revealed redemonstration of an elongated approximately 7 x 15 mm solid noncalcified nodule in the middle lobe which was similar to the prior study, multiple additional sub 4 mm calcified and noncalcified nodules throughout bilateral lungs which are also stable since the prior study, no new or suspicious lung nodule, no metastatic disease identified within the chest.  I explained these results to her and her husband. She has a WBC of 5.3, hemoglobin of 13.4, and platelet count of 154,000. Her CMP is normal other than an elevated BUN of 25 up from 22 and an elevated creatinine of 1.21 up from 1.07. Her CEA and CA 27.29 are pending today. I recommended for her to increase her fluid intake. We will repeat the chest CT and renal abdominal CT in 6 months. She will have her next bilateral screening mammogram in April 2026. She has requested that I take over scheduling her annual mammograms instead of Dr. Bert. She will visit her PCP next month. I will see her back in 6 months with CBC, CMP, and we will discuss the results of her mammogram and CT scans of chest and abdomen. The patient and her husband understand the plans discussed today and are in agreement with them.  She knows to contact our office if she develops concerns prior to her next appointment.  I provided 30 minutes of face-to-face time during this encounter and > 50% was spent counseling as documented under my assessment and plan.   Wanda VEAR Cornish, MD [facility] A DEPT OF [facility] [address] [place] [address] Dept: [phone] Dept Fax: [phone]   No orders of the defined types were placed in this encounter.  CHIEF COMPLAINT:  CC: Stage IIB hormone receptor positive breast cancer  Current Treatment:  Anastrozole  1 mg daily  HISTORY OF PRESENT ILLNESS:   Oncology History  Breast cancer of upper-outer quadrant of  left female breast (HCC)  06/01/2021 Initial Diagnosis   Breast cancer of upper-outer quadrant of left female breast (HCC)   07/13/2021 Cancer Staging   Staging form: Breast, AJCC 8th Edition - Clinical stage from 07/13/2021: Stage IIB (cT2, cN0(sn), cM0, G3, ER+, PR-, HER2-) - Signed by Cornish Wanda VEAR, MD on 08/10/2021 Histopathologic type: Infiltrating duct carcinoma, NOS Stage prefix: Initial diagnosis Method of lymph node assessment: Sentinel lymph node biopsy Nuclear grade: G3 Histologic grading system: 3 grade system Laterality: Left Tumor size (mm): 28 Lymph-vascular invasion (LVI): LVI not present (absent)/not identified Diagnostic confirmation: Positive histology Specimen type: Excision Staged by: Managing physician Percentage of positive estrogen receptors (%): 30 Percentage of positive progesterone receptors (%): 0 HER2-IHC interpretation: Negative HER2-IHC value: Score 1+ Menopausal status: Postmenopausal Ki-67 (%): 60 Stage used in treatment planning: Yes National guidelines used in treatment planning: Yes Type of national guideline used in treatment planning: NCCN   09/29/2021 - 10/27/2021 Chemotherapy   Patient is on Treatment Plan : BREAST Adjuvant CMF IV q21d     09/29/2021 - 03/16/2022 Chemotherapy   Patient is on Treatment Plan :  BREAST Adjuvant CMF IV q21d       INTERVAL HISTORY:  Jakeline is here today for repeat clinical assessment for her stage IIB hormone receptor positive breast cancer. Patient states that she feels good and has no complaints of pain. She informed me that she has mild throat irritation with hoarseness and I instructed her to remain on omeprazole for GERD since she has a severe hiatal hernia and does feel better since she has been on that medication. Her diagnostic mammogram on 06/12/2023 was negative. Her CT renal abdomen on 09/13/2023 revealed multiple benign hemorrhagic or proteinaceous left renal cysts with no solid renal mass identified,  mild bilateral nonobstructing nephrolithiasis, a large hiatal hernia, moderate cardiomegaly which was not fully assessed, and aortic atherosclerosis. Her CT chest on 11/22/2023 revealed redemonstration of an elongated approximately 7 x 15 mm solid noncalcified nodule in the middle lobe which was similar to the prior study, multiple additional sub 4 mm calcified and noncalcified nodules throughout bilateral lungs which are also stable since the prior study, no new or suspicious lung nodule, no metastatic disease identified within the chest, and multiple other nonacute observations. I explained these results to her and her husband. She has a WBC of 5.3, hemoglobin of 13.4, and platelet count of 154,000. Her CMP is normal other than an elevated BUN of 25 up from 22 and an elevated creatinine of 1.21 up from 1.07. Her CEA and CA 27.29 are pending today. I recommended for her to increase her fluid intake. We will repeat the chest CT and renal abdomen CT in 6 months. She will have her next bilateral screening mammogram in April 2026. She has requested that I take over scheduling her annual mammograms instead of Dr. Bert. She will visit her PCP next month. I will see her back in 6 months with CBC, CMP, and we will discuss the results of her mammogram and CT scan.  She denies fever, chills, night sweats, or other signs of infection. She denies cardiorespiratory and gastrointestinal issues. She  denies pain. Her appetite is good and Her weight has been stable. She is 133lb 6.4oz today. She is accompanied by her husband.  REVIEW OF SYSTEMS:  Review of Systems  Constitutional:  Negative for appetite change, chills, diaphoresis, fatigue, fever and unexpected weight change.  HENT:  Negative.  Negative for hearing loss, lump/mass, mouth sores, nosebleeds, sore throat, tinnitus, trouble swallowing and voice change.        Throat irritation.  Eyes:  Negative for eye problems and icterus.  Respiratory:  Positive for  shortness of breath (with exertion). Negative for chest tightness, cough, hemoptysis and wheezing.   Cardiovascular:  Negative for chest pain, leg swelling and palpitations.  Gastrointestinal: Negative.  Negative for abdominal distention, abdominal pain, blood in stool, constipation, diarrhea, nausea, rectal pain and vomiting.  Endocrine: Negative for hot flashes.  Genitourinary:  Negative for bladder incontinence, difficulty urinating, dyspareunia, dysuria, frequency, hematuria, menstrual problem, nocturia, pelvic pain, vaginal bleeding and vaginal discharge.   Musculoskeletal: Negative.  Negative for arthralgias, back pain, flank pain, gait problem, myalgias, neck pain and neck stiffness.  Skin: Negative.  Negative for itching, rash and wound.  Neurological:  Positive for numbness (bilateral fingers, toes, feet and legs). Negative for dizziness, extremity weakness, gait problem, headaches, light-headedness, seizures and speech difficulty.  Hematological: Negative.  Negative for adenopathy. Does not bruise/bleed easily.  Psychiatric/Behavioral: Negative.  Negative for confusion, decreased concentration, depression, sleep disturbance and suicidal ideas. The patient is not nervous/anxious.  VITALS:  Blood pressure (!) 145/86, pulse 61, temperature 97.7 F (36.5 C), temperature source Oral, resp. rate 16, height 5' 4 (1.626 m), weight 133 lb 6.4 oz (60.5 kg), SpO2 99%.  Wt Readings from Last 3 Encounters:  11/29/23 133 lb 6.4 oz (60.5 kg)  08/09/23 134 lb (60.8 kg)  05/23/23 132 lb 11.2 oz (60.2 kg)    Body mass index is 22.9 kg/m.  Performance status (ECOG): 1 - Symptomatic but completely ambulatory  PHYSICAL EXAM:  Physical Exam Vitals and nursing note reviewed.  Constitutional:      General: She is not in acute distress.    Appearance: Normal appearance. She is normal weight. She is not ill-appearing, toxic-appearing or diaphoretic.  HENT:     Head: Normocephalic and atraumatic.      Right Ear: Tympanic membrane, ear canal and external ear normal. There is no impacted cerumen.     Left Ear: Tympanic membrane, ear canal and external ear normal. There is no impacted cerumen.     Nose: Nose normal. No congestion or rhinorrhea.     Mouth/Throat:     Mouth: Mucous membranes are moist.     Pharynx: Oropharynx is clear. No oropharyngeal exudate or posterior oropharyngeal erythema.  Eyes:     General: No scleral icterus.    Extraocular Movements: Extraocular movements intact.     Conjunctiva/sclera: Conjunctivae normal.     Pupils: Pupils are equal, round, and reactive to light.  Cardiovascular:     Rate and Rhythm: Normal rate and regular rhythm.     Heart sounds: Murmur heard.     Systolic murmur is present with a grade of 2/6.     No friction rub. No gallop.  Pulmonary:     Effort: Pulmonary effort is normal. No respiratory distress.     Breath sounds: Normal breath sounds. No stridor. No wheezing, rhonchi or rales.  Chest:     Chest wall: No tenderness.  Breasts:    Right: Normal. No swelling, bleeding, inverted nipple, mass, nipple discharge, skin change or tenderness.     Left: Normal. No swelling, bleeding, inverted nipple, mass, nipple discharge, skin change or tenderness.     Comments: Faded scar in the upper outer quadrant of the right breast just above the areolar complex.  Well-healed scar in the upper inner quadrant of the left breast.  Well-healed scar in the left axilla. No masses in either breast. Abdominal:     General: Bowel sounds are normal. There is no distension.     Palpations: Abdomen is soft. There is no mass.     Tenderness: There is no abdominal tenderness.  Musculoskeletal:        General: Normal range of motion.     Cervical back: Normal range of motion and neck supple. No tenderness.     Right lower leg: No edema.     Left lower leg: No edema.  Lymphadenopathy:     Cervical: No cervical adenopathy.  Skin:    General: Skin is warm  and dry.     Coloration: Skin is not jaundiced.     Findings: No petechiae or rash.     Comments: Hyperpigmentation of bilateral lower extremities. Skin is dry with no swelling  Neurological:     Mental Status: She is alert and oriented to person, place, and time.     Cranial Nerves: No cranial nerve deficit.  Psychiatric:        Mood and Affect: Mood normal.  Behavior: Behavior normal.        Thought Content: Thought content normal.    LABS:      Latest Ref Rng & Units 11/29/2023    1:17 PM 08/09/2023    3:34 PM 05/23/2023    1:04 PM  CBC  WBC 4.0 - 10.5 K/uL 5.3  5.2  5.4   Hemoglobin 12.0 - 15.0 g/dL 86.5  86.2  86.2   Hematocrit 36.0 - 46.0 % 41.1  41.4  41.4   Platelets 150 - 400 K/uL 154  134  137       Latest Ref Rng & Units 11/29/2023    1:17 PM 08/09/2023    3:34 PM 05/23/2023    1:04 PM  CMP  Glucose 70 - 99 mg/dL 90  893  88   BUN 8 - 23 mg/dL 25  22  25    Creatinine 0.44 - 1.00 mg/dL 8.78  8.92  8.99   Sodium 135 - 145 mmol/L 140  140  141   Potassium 3.5 - 5.1 mmol/L 4.2  4.3  3.9   Chloride 98 - 111 mmol/L 104  104  103   CO2 22 - 32 mmol/L 26  24  29    Calcium 8.9 - 10.3 mg/dL 9.5  89.4  89.9   Total Protein 6.5 - 8.1 g/dL 7.4  7.1  7.2   Total Bilirubin 0.0 - 1.2 mg/dL 0.7  0.8  0.6   Alkaline Phos 38 - 126 U/L 125  108  118   AST 15 - 41 U/L 36  30  34   ALT 0 - 44 U/L 24  19  18     STUDIES:  EXAM: 11/22/2023 CT CHEST WITH CONTRAST IMPRESSION: 1. Redemonstration of an elongated approximately 7 x 15 mm solid noncalcified nodule in the middle lobe, similar to the prior study. There are multiple additional sub 4 mm calcified and noncalcified nodules throughout bilateral lungs, which are also stable since the prior study. No new or suspicious lung nodule. Follow-up is recommended in 6 months to document longer stability. 2. Otherwise, no metastatic disease identified within the chest. 3. Multiple other nonacute observations, as described  above.  EXAM: 09/13/2023 CT ABDOMEN WITHOUT AND WITH CONTRAST IMPRESSION: 1. Multiple benign hemorrhagic or proteinaceous left renal cysts. No solid renal mass identified. 2. Mild bilateral nonobstructing nephrolithiasis. 3. Large hiatal hernia. 4. Moderate cardiomegaly, not fully assessed on this examination. 5. Aortic atherosclerosis.  EXAM: 06/12/2023 MAM DIGITAL W/TOMO DIAG B IMPRESSION: Right breast: BIRADS 2 BENIGN FINDING. Left breast: BIRADS 2 BENIGN FINDING.  HISTORY:   Past Medical History:  Diagnosis Date   Family history of breast cancer 02/17/2022   H/O mitral valve repair    HTN (hypertension)    Status post left breast lumpectomy     Past Surgical History:  Procedure Laterality Date   TONSILLECTOMY      Family History  Problem Relation Age of Onset   Cancer Mother    Cancer Maternal Aunt     Social History:  reports that she has never smoked. She has never used smokeless tobacco. She reports that she does not drink alcohol and does not use drugs.The patient is alone today.  Allergies: No Known Allergies  Current Medications: Current Outpatient Medications  Medication Sig Dispense Refill   omeprazole (PRILOSEC) 20 MG capsule      amiodarone (PACERONE) 200 MG tablet Take 200 mg by mouth daily.     anastrozole  (ARIMIDEX ) 1 MG tablet TAKE  1 TABLET EVERY DAY 90 tablet 3   Calcium Citrate-Vitamin D 315-5 MG-MCG TABS Take 1 tablet by mouth daily.     Cholecalciferol 25 MCG (1000 UT) capsule Take by mouth.     Coenzyme Q10 50 MG CAPS Take by mouth.     diltiazem (CARDIZEM CD) 180 MG 24 hr capsule Take 180 mg by mouth daily.     docusate sodium (COLACE) 100 MG capsule Take 100 mg by mouth 2 (two) times daily.     FLUAD QUADRIVALENT 0.5 ML injection      fluticasone (FLONASE) 50 MCG/ACT nasal spray Place into both nostrils.     glucosamine-chondroitin 500-400 MG tablet Take 1 tablet by mouth daily.     levocetirizine (XYZAL) 5 MG tablet Take 5 mg by mouth  daily.     losartan (COZAAR) 100 MG tablet Take 100 mg by mouth daily.     multivitamin-lutein (OCUVITE-LUTEIN) CAPS capsule Take 1 capsule by mouth daily.     ondansetron  (ZOFRAN ) 8 MG tablet Take 1 tablet (8 mg total) by mouth 2 (two) times daily as needed for refractory nausea / vomiting (Start on day 3 after chemotherapy). 30 tablet 1   prochlorperazine  (COMPAZINE ) 10 MG tablet Take 1 tablet (10 mg total) by mouth every 6 (six) hours as needed for nausea or vomiting. 30 tablet 1   simvastatin (ZOCOR) 20 MG tablet Take 20 mg by mouth at bedtime.     No current facility-administered medications for this visit.    I,Arianna Delsanto H Azaryah Heathcock,acting as a scribe for Wanda VEAR Cornish, MD.,have documented all relevant documentation on the behalf of Wanda VEAR Cornish, MD,as directed by  Wanda VEAR Cornish, MD while in the presence of Wanda VEAR Cornish, MD.

## 2023-11-30 LAB — CANCER ANTIGEN 27.29: CA 27.29: 34.1 U/mL (ref 0.0–38.6)

## 2023-12-09 ENCOUNTER — Other Ambulatory Visit: Payer: Self-pay | Admitting: Oncology

## 2023-12-09 ENCOUNTER — Encounter: Payer: Self-pay | Admitting: Oncology

## 2023-12-09 DIAGNOSIS — R970 Elevated carcinoembryonic antigen [CEA]: Secondary | ICD-10-CM | POA: Insufficient documentation

## 2023-12-09 DIAGNOSIS — C50412 Malignant neoplasm of upper-outer quadrant of left female breast: Secondary | ICD-10-CM

## 2023-12-09 DIAGNOSIS — R911 Solitary pulmonary nodule: Secondary | ICD-10-CM

## 2023-12-12 DIAGNOSIS — R002 Palpitations: Secondary | ICD-10-CM | POA: Diagnosis not present

## 2023-12-12 DIAGNOSIS — I341 Nonrheumatic mitral (valve) prolapse: Secondary | ICD-10-CM | POA: Diagnosis not present

## 2023-12-12 DIAGNOSIS — C50412 Malignant neoplasm of upper-outer quadrant of left female breast: Secondary | ICD-10-CM | POA: Diagnosis not present

## 2023-12-12 DIAGNOSIS — Z170 Estrogen receptor positive status [ER+]: Secondary | ICD-10-CM | POA: Diagnosis not present

## 2023-12-12 DIAGNOSIS — Z7901 Long term (current) use of anticoagulants: Secondary | ICD-10-CM | POA: Diagnosis not present

## 2023-12-12 DIAGNOSIS — I10 Essential (primary) hypertension: Secondary | ICD-10-CM | POA: Diagnosis not present

## 2023-12-12 DIAGNOSIS — I480 Paroxysmal atrial fibrillation: Secondary | ICD-10-CM | POA: Diagnosis not present

## 2024-01-07 DIAGNOSIS — C50912 Malignant neoplasm of unspecified site of left female breast: Secondary | ICD-10-CM | POA: Diagnosis not present

## 2024-01-07 DIAGNOSIS — Z0000 Encounter for general adult medical examination without abnormal findings: Secondary | ICD-10-CM | POA: Diagnosis not present

## 2024-01-07 DIAGNOSIS — Z79899 Other long term (current) drug therapy: Secondary | ICD-10-CM | POA: Diagnosis not present

## 2024-01-07 DIAGNOSIS — I480 Paroxysmal atrial fibrillation: Secondary | ICD-10-CM | POA: Diagnosis not present

## 2024-01-07 DIAGNOSIS — Z131 Encounter for screening for diabetes mellitus: Secondary | ICD-10-CM | POA: Diagnosis not present

## 2024-01-07 DIAGNOSIS — H3530 Unspecified macular degeneration: Secondary | ICD-10-CM | POA: Diagnosis not present

## 2024-01-07 DIAGNOSIS — H6123 Impacted cerumen, bilateral: Secondary | ICD-10-CM | POA: Diagnosis not present

## 2024-01-07 DIAGNOSIS — N1831 Chronic kidney disease, stage 3a: Secondary | ICD-10-CM | POA: Diagnosis not present

## 2024-01-07 DIAGNOSIS — Z23 Encounter for immunization: Secondary | ICD-10-CM | POA: Diagnosis not present

## 2024-01-08 DIAGNOSIS — R002 Palpitations: Secondary | ICD-10-CM | POA: Diagnosis not present

## 2024-01-08 DIAGNOSIS — I341 Nonrheumatic mitral (valve) prolapse: Secondary | ICD-10-CM | POA: Diagnosis not present

## 2024-01-09 DIAGNOSIS — I4719 Other supraventricular tachycardia: Secondary | ICD-10-CM | POA: Diagnosis not present

## 2024-01-09 DIAGNOSIS — I341 Nonrheumatic mitral (valve) prolapse: Secondary | ICD-10-CM | POA: Diagnosis not present

## 2024-01-09 DIAGNOSIS — I480 Paroxysmal atrial fibrillation: Secondary | ICD-10-CM | POA: Diagnosis not present

## 2024-01-29 ENCOUNTER — Other Ambulatory Visit: Payer: Self-pay | Admitting: Oncology

## 2024-01-29 DIAGNOSIS — C50412 Malignant neoplasm of upper-outer quadrant of left female breast: Secondary | ICD-10-CM

## 2024-02-04 DIAGNOSIS — R059 Cough, unspecified: Secondary | ICD-10-CM | POA: Diagnosis not present

## 2024-02-04 DIAGNOSIS — Z87898 Personal history of other specified conditions: Secondary | ICD-10-CM | POA: Diagnosis not present

## 2024-02-04 DIAGNOSIS — K219 Gastro-esophageal reflux disease without esophagitis: Secondary | ICD-10-CM | POA: Diagnosis not present

## 2024-02-04 DIAGNOSIS — J3089 Other allergic rhinitis: Secondary | ICD-10-CM | POA: Diagnosis not present

## 2024-02-04 DIAGNOSIS — N1831 Chronic kidney disease, stage 3a: Secondary | ICD-10-CM | POA: Diagnosis not present

## 2024-02-04 DIAGNOSIS — J342 Deviated nasal septum: Secondary | ICD-10-CM | POA: Diagnosis not present

## 2024-02-04 DIAGNOSIS — Z974 Presence of external hearing-aid: Secondary | ICD-10-CM | POA: Diagnosis not present

## 2024-02-04 DIAGNOSIS — H9193 Unspecified hearing loss, bilateral: Secondary | ICD-10-CM | POA: Diagnosis not present

## 2024-02-04 DIAGNOSIS — J343 Hypertrophy of nasal turbinates: Secondary | ICD-10-CM | POA: Diagnosis not present

## 2024-02-04 DIAGNOSIS — H6123 Impacted cerumen, bilateral: Secondary | ICD-10-CM | POA: Diagnosis not present

## 2024-03-16 ENCOUNTER — Other Ambulatory Visit: Payer: Self-pay | Admitting: Oncology

## 2024-03-16 DIAGNOSIS — C50412 Malignant neoplasm of upper-outer quadrant of left female breast: Secondary | ICD-10-CM

## 2024-06-12 ENCOUNTER — Other Ambulatory Visit

## 2024-06-12 ENCOUNTER — Ambulatory Visit (HOSPITAL_BASED_OUTPATIENT_CLINIC_OR_DEPARTMENT_OTHER): Admitting: Radiology

## 2024-06-12 ENCOUNTER — Other Ambulatory Visit (HOSPITAL_BASED_OUTPATIENT_CLINIC_OR_DEPARTMENT_OTHER): Admitting: Radiology

## 2024-06-19 ENCOUNTER — Ambulatory Visit: Admitting: Oncology

## 2024-06-19 ENCOUNTER — Inpatient Hospital Stay
